# Patient Record
Sex: Female | Born: 1950 | Race: White | Hispanic: No | Marital: Single | Smoking: Never smoker
Health system: Southern US, Community
[De-identification: ages and names within clinical notes are randomized; demographics above are authoritative.]

## PROBLEM LIST (undated history)

## (undated) DIAGNOSIS — G40909 Epilepsy, unspecified, not intractable, without status epilepticus: Secondary | ICD-10-CM

## (undated) DIAGNOSIS — L97909 Non-pressure chronic ulcer of unspecified part of unspecified lower leg with unspecified severity: Secondary | ICD-10-CM

## (undated) DIAGNOSIS — I872 Venous insufficiency (chronic) (peripheral): Secondary | ICD-10-CM

## (undated) DIAGNOSIS — E079 Disorder of thyroid, unspecified: Secondary | ICD-10-CM

## (undated) DIAGNOSIS — G250 Essential tremor: Secondary | ICD-10-CM

## (undated) DIAGNOSIS — I83009 Varicose veins of unspecified lower extremity with ulcer of unspecified site: Secondary | ICD-10-CM

## (undated) DIAGNOSIS — I509 Heart failure, unspecified: Secondary | ICD-10-CM

## (undated) DIAGNOSIS — I10 Essential (primary) hypertension: Secondary | ICD-10-CM

## (undated) DIAGNOSIS — R1310 Dysphagia, unspecified: Secondary | ICD-10-CM

## (undated) HISTORY — DX: Dysphagia, unspecified: R13.10

## (undated) HISTORY — PX: OTHER SURGICAL HISTORY: SHX169

## (undated) HISTORY — PX: ABDOMINAL HYSTERECTOMY: SHX81

---

## 1999-02-11 ENCOUNTER — Encounter: Payer: Self-pay | Admitting: Emergency Medicine

## 1999-02-11 ENCOUNTER — Inpatient Hospital Stay (HOSPITAL_COMMUNITY): Admission: EM | Admit: 1999-02-11 | Discharge: 1999-02-12 | Payer: Self-pay | Admitting: Emergency Medicine

## 2000-04-29 ENCOUNTER — Encounter: Payer: Self-pay | Admitting: Orthopedic Surgery

## 2000-04-29 ENCOUNTER — Encounter: Admission: RE | Admit: 2000-04-29 | Discharge: 2000-04-29 | Payer: Self-pay | Admitting: Orthopedic Surgery

## 2000-09-08 ENCOUNTER — Emergency Department (HOSPITAL_COMMUNITY): Admission: EM | Admit: 2000-09-08 | Discharge: 2000-09-08 | Payer: Self-pay | Admitting: Emergency Medicine

## 2000-09-08 ENCOUNTER — Encounter: Payer: Self-pay | Admitting: Emergency Medicine

## 2004-03-05 ENCOUNTER — Emergency Department (HOSPITAL_COMMUNITY): Admission: EM | Admit: 2004-03-05 | Discharge: 2004-03-05 | Payer: Self-pay | Admitting: Emergency Medicine

## 2004-03-15 ENCOUNTER — Ambulatory Visit (HOSPITAL_COMMUNITY): Admission: RE | Admit: 2004-03-15 | Discharge: 2004-03-16 | Payer: Self-pay | Admitting: Orthopedic Surgery

## 2004-09-08 ENCOUNTER — Emergency Department (HOSPITAL_COMMUNITY): Admission: EM | Admit: 2004-09-08 | Discharge: 2004-09-08 | Payer: Self-pay | Admitting: Emergency Medicine

## 2005-04-16 ENCOUNTER — Ambulatory Visit (HOSPITAL_COMMUNITY): Admission: RE | Admit: 2005-04-16 | Discharge: 2005-04-16 | Payer: Self-pay | Admitting: Orthopedic Surgery

## 2007-01-02 ENCOUNTER — Emergency Department (HOSPITAL_COMMUNITY): Admission: EM | Admit: 2007-01-02 | Discharge: 2007-01-02 | Payer: Self-pay | Admitting: Emergency Medicine

## 2007-12-09 ENCOUNTER — Ambulatory Visit: Payer: Self-pay | Admitting: Internal Medicine

## 2007-12-09 ENCOUNTER — Ambulatory Visit: Payer: Self-pay | Admitting: Cardiology

## 2007-12-09 ENCOUNTER — Inpatient Hospital Stay (HOSPITAL_COMMUNITY): Admission: AD | Admit: 2007-12-09 | Discharge: 2007-12-16 | Payer: Self-pay | Admitting: Internal Medicine

## 2007-12-10 ENCOUNTER — Encounter (INDEPENDENT_AMBULATORY_CARE_PROVIDER_SITE_OTHER): Payer: Self-pay | Admitting: Internal Medicine

## 2007-12-10 ENCOUNTER — Ambulatory Visit: Payer: Self-pay | Admitting: *Deleted

## 2008-01-28 ENCOUNTER — Inpatient Hospital Stay (HOSPITAL_COMMUNITY): Admission: EM | Admit: 2008-01-28 | Discharge: 2008-02-05 | Payer: Self-pay | Admitting: Emergency Medicine

## 2008-01-28 ENCOUNTER — Ambulatory Visit: Payer: Self-pay | Admitting: Internal Medicine

## 2008-01-31 ENCOUNTER — Ambulatory Visit: Payer: Self-pay | Admitting: Vascular Surgery

## 2008-02-09 ENCOUNTER — Encounter (HOSPITAL_BASED_OUTPATIENT_CLINIC_OR_DEPARTMENT_OTHER): Admission: RE | Admit: 2008-02-09 | Discharge: 2008-05-09 | Payer: Self-pay | Admitting: Surgery

## 2008-05-11 ENCOUNTER — Encounter (HOSPITAL_BASED_OUTPATIENT_CLINIC_OR_DEPARTMENT_OTHER): Admission: RE | Admit: 2008-05-11 | Discharge: 2008-08-09 | Payer: Self-pay | Admitting: Internal Medicine

## 2008-08-13 ENCOUNTER — Encounter (HOSPITAL_BASED_OUTPATIENT_CLINIC_OR_DEPARTMENT_OTHER): Admission: RE | Admit: 2008-08-13 | Discharge: 2008-09-13 | Payer: Self-pay | Admitting: Internal Medicine

## 2008-09-11 ENCOUNTER — Emergency Department (HOSPITAL_COMMUNITY): Admission: EM | Admit: 2008-09-11 | Discharge: 2008-09-11 | Payer: Self-pay | Admitting: Emergency Medicine

## 2008-09-21 ENCOUNTER — Encounter: Admission: RE | Admit: 2008-09-21 | Discharge: 2008-09-21 | Payer: Self-pay | Admitting: Orthopedic Surgery

## 2008-10-01 ENCOUNTER — Ambulatory Visit (HOSPITAL_COMMUNITY): Admission: RE | Admit: 2008-10-01 | Discharge: 2008-10-01 | Payer: Self-pay | Admitting: Orthopedic Surgery

## 2008-10-06 ENCOUNTER — Encounter (HOSPITAL_BASED_OUTPATIENT_CLINIC_OR_DEPARTMENT_OTHER): Admission: RE | Admit: 2008-10-06 | Discharge: 2008-10-26 | Payer: Self-pay | Admitting: Internal Medicine

## 2009-02-28 IMAGING — CR DG CHEST 1V PORT
1 series · 1 of 1 positions shown · non-contrast
Comparison: None.

CLINICAL DATA: PICC line placement. 
 PORTABLE CHEST ([DATE] HOURS):

[view not recorded]
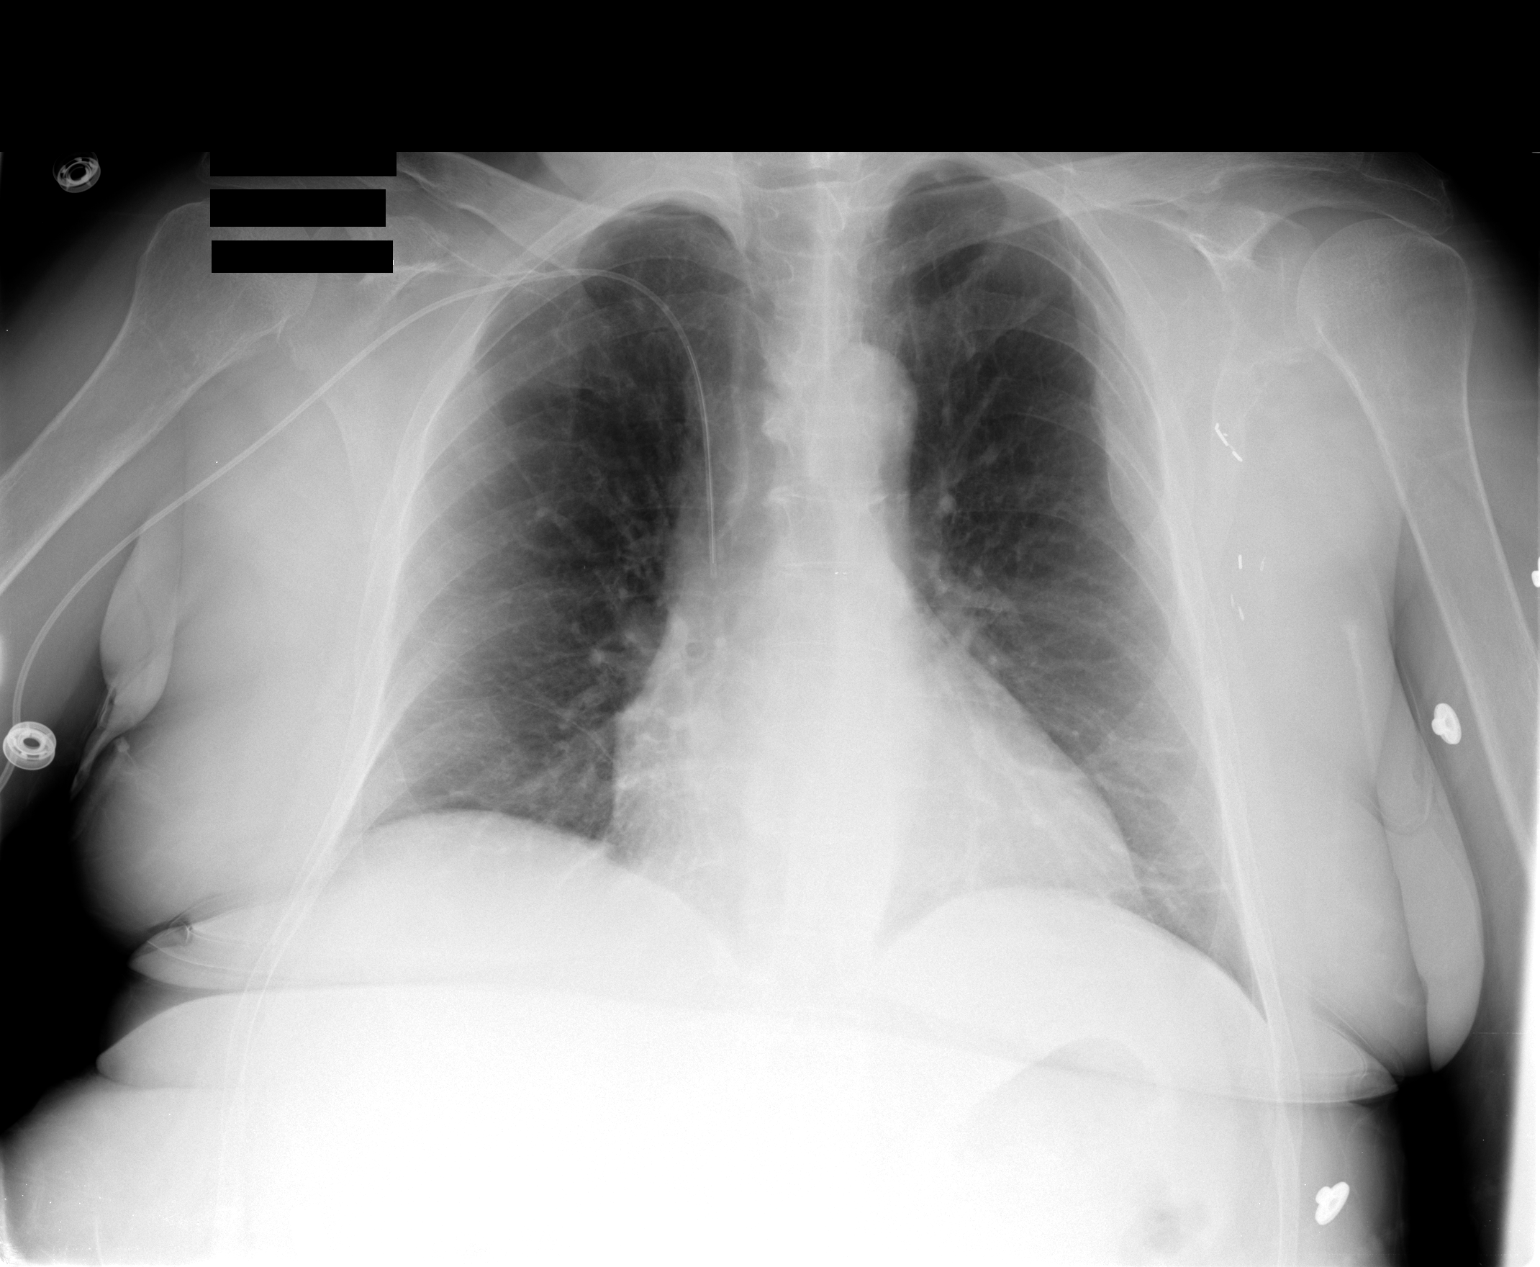

[1 of 1 positions shown; findings below may reference images not displayed]

FINDINGS: Right arm PICC tip is in the lower SVC.   There is no pneumothorax.  The heart is mildly enlarged.  The mediastinal contours are normal.  The lungs are clear.  Old left-sided rib fractures and adjacent pleural thickening are noted.  There are surgical clips in the left axilla.
IMPRESSION: Right arm PICC tip in the SVC.  No pneumothorax.

## 2009-12-12 IMAGING — CT CT 3D INDEPENDENT WKST
2 of 4 series · 6 of 14 positions shown, 7 images · non-contrast
Comparison: Left shoulder radiographs 09/11/2008

CLINICAL DATA: Fell.  Fractured left proximal humerus.

CT LEFT SHOULDER WITHOUT CONTRAST
TECHNIQUE: Multidetector CT imaging of the left shoulder was
performed according to the standard protocol without intravenous
contrast. Multiplanar CT image reconstructions were also generated.
TECHNIQUE: 3-dimensional CT images were rendered by post-processing
of the original CT data at independent workstation.  The 3-
dimensional CT images were interpreted, and findings were reported
in the accompanying complete CT report for this study.

[Series 4: left humerus · axial · 0.33mm/px · z∈[+166,+252]mm · 3 of 139 slices shown, 4 images (1 of 2)]
[im 35/139  soft-tissue]
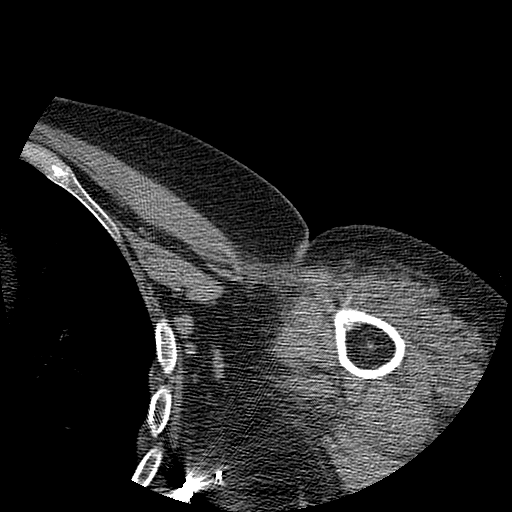
[im 35/139  bone]
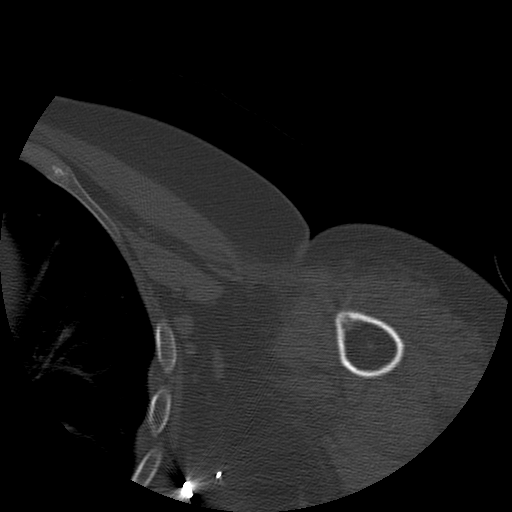
[im 70/139  bone]
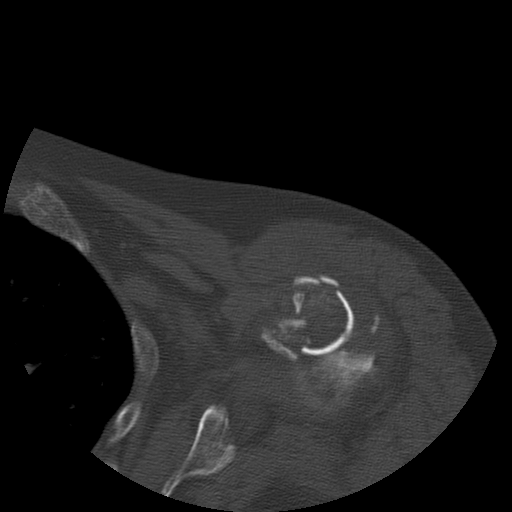
[im 104/139  bone]
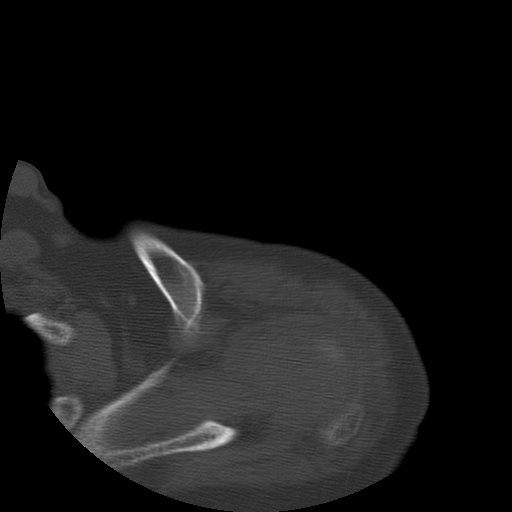

[Series 5: left humerus · axial · 0.33mm/px · z∈[+166,+252]mm · 3 of 139 slices shown (2 of 2)]
[im 35/139  bone]
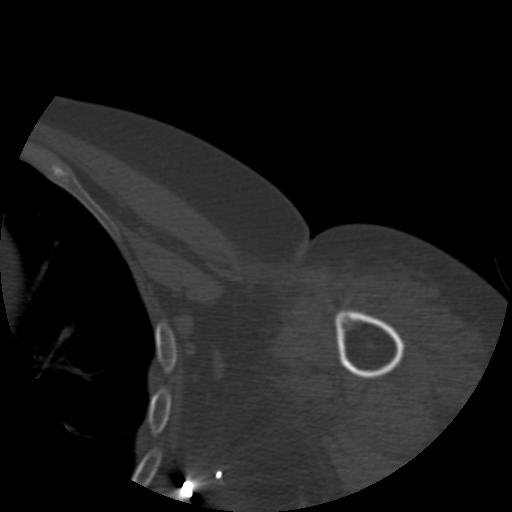
[im 70/139  bone]
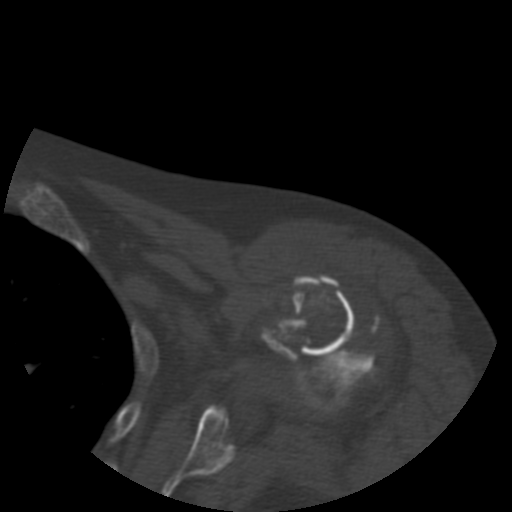
[im 104/139  bone]
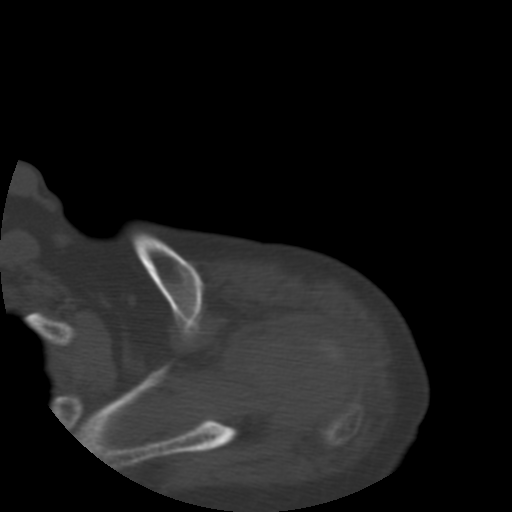

[6 of 14 positions shown; findings below may reference images not displayed]

FINDINGS: There is a complex, comminuted, displaced four part
humeral head and neck fracture.  The humeral head is displaced
slightly rotated posteriorly in comparison with the humeral shaft.
The glenohumeral joint spaces maintained.  The AC joint is intact.
There is mild lateral downsloping of the acromion which is type 2
in shape.
IMPRESSION: 1.  Complex, comminuted and displaced four part humeral head /neck
fracture.

3-DIMENSIONAL CT IMAGE RENDERING AT INDEPENDENT WORKSTATION:

## 2010-07-02 ENCOUNTER — Emergency Department (HOSPITAL_COMMUNITY): Admission: EM | Admit: 2010-07-02 | Discharge: 2010-07-02 | Payer: Self-pay | Admitting: Emergency Medicine

## 2010-08-10 ENCOUNTER — Inpatient Hospital Stay (HOSPITAL_COMMUNITY): Admission: EM | Admit: 2010-08-10 | Discharge: 2010-08-15 | Payer: Self-pay | Admitting: Emergency Medicine

## 2010-08-12 ENCOUNTER — Encounter (INDEPENDENT_AMBULATORY_CARE_PROVIDER_SITE_OTHER): Payer: Self-pay | Admitting: Internal Medicine

## 2011-03-01 LAB — BASIC METABOLIC PANEL
CO2: 22 mEq/L (ref 19–32)
CO2: 25 mEq/L (ref 19–32)
Calcium: 8.6 mg/dL (ref 8.4–10.5)
Chloride: 114 mEq/L — ABNORMAL HIGH (ref 96–112)
Creatinine, Ser: 0.51 mg/dL (ref 0.4–1.2)
GFR calc Af Amer: >60 mL/min (ref >60)
Glucose, Bld: 126 mg/dL — ABNORMAL HIGH (ref 70–99)
Sodium: 145 mEq/L (ref 135–145)

## 2011-03-01 LAB — CBC
HCT: 31.2 % — ABNORMAL LOW (ref 36.0–46.0)
HCT: 32.7 % — ABNORMAL LOW (ref 36.0–46.0)
HCT: 38.9 % (ref 36.0–46.0)
Hemoglobin: 10.5 g/dL — ABNORMAL LOW (ref 12.0–15.0)
Hemoglobin: 9.8 g/dL — ABNORMAL LOW (ref 12.0–15.0)
Hemoglobin: 9.9 g/dL — ABNORMAL LOW (ref 12.0–15.0)
MCH: 29.4 pg (ref 26.0–34.0)
MCH: 29.6 pg (ref 26.0–34.0)
MCH: 29.9 pg (ref 26.0–34.0)
MCHC: 30.2 g/dL (ref 30.0–36.0)
MCHC: 30.3 g/dL (ref 30.0–36.0)
MCHC: 31.4 g/dL (ref 30.0–36.0)
MCV: 97.6 fL (ref 78.0–100.0)
MCV: 97.9 fL (ref 78.0–100.0)
Platelets: 214 10*3/uL (ref 150–400)
Platelets: 56 10*3/uL — ABNORMAL LOW (ref 150–400)
RBC: 3.25 MIL/uL — ABNORMAL LOW (ref 3.87–5.11)
RBC: 3.31 MIL/uL — ABNORMAL LOW (ref 3.87–5.11)
RDW: 17.1 % — ABNORMAL HIGH (ref 11.5–15.5)
WBC: 5.5 10*3/uL (ref 4.0–10.5)
WBC: 5.5 10*3/uL (ref 4.0–10.5)

## 2011-03-01 LAB — COMPREHENSIVE METABOLIC PANEL
ALT: 10 U/L (ref 0–35)
ALT: 14 U/L (ref 0–35)
AST: 21 U/L (ref 0–37)
AST: 34 U/L (ref 0–37)
Albumin: 2.1 g/dL — ABNORMAL LOW (ref 3.5–5.2)
Alkaline Phosphatase: 196 U/L — ABNORMAL HIGH (ref 39–117)
Alkaline Phosphatase: 275 U/L — ABNORMAL HIGH (ref 39–117)
CO2: 26 mEq/L (ref 19–32)
CO2: 26 mEq/L (ref 19–32)
Calcium: 8.1 mg/dL — ABNORMAL LOW (ref 8.4–10.5)
Chloride: 102 mEq/L (ref 96–112)
Chloride: 105 mEq/L (ref 96–112)
Creatinine, Ser: 0.91 mg/dL (ref 0.4–1.2)
GFR calc Af Amer: >60 mL/min (ref >60)
GFR calc non Af Amer: >60 mL/min (ref >60)
GFR calc non Af Amer: >60 mL/min (ref >60)
Potassium: 3.1 mEq/L — ABNORMAL LOW (ref 3.5–5.1)
Potassium: 3.5 mEq/L (ref 3.5–5.1)
Sodium: 140 mEq/L (ref 135–145)
Total Protein: 6.7 g/dL (ref 6.0–8.3)

## 2011-03-01 LAB — URINALYSIS, ROUTINE W REFLEX MICROSCOPIC
Hgb urine dipstick: NEGATIVE
Nitrite: POSITIVE — AB
pH: 8 (ref 5.0–8.0)

## 2011-03-01 LAB — URINE MICROSCOPIC-ADD ON

## 2011-03-01 LAB — URINE CULTURE: Colony Count: >=100000

## 2011-03-01 LAB — WOUND CULTURE: Culture: NO GROWTH

## 2011-03-01 LAB — DIFFERENTIAL
Eosinophils Absolute: 0.1 10*3/uL (ref 0.0–0.7)
Monocytes Relative: 8 % (ref 3–12)
Neutrophils Relative %: 65 % (ref 43–77)

## 2011-03-01 LAB — TSH: TSH: 18.458 u[IU]/mL — ABNORMAL HIGH (ref 0.350–4.500)

## 2011-03-01 LAB — BRAIN NATRIURETIC PEPTIDE: Pro B Natriuretic peptide (BNP): 458 pg/mL — ABNORMAL HIGH (ref 0.0–100.0)

## 2011-03-01 LAB — VANCOMYCIN, TROUGH: Vancomycin Tr: 14.7 ug/mL (ref 10.0–20.0)

## 2011-03-01 LAB — POCT CARDIAC MARKERS: Myoglobin, poc: 319 ng/mL (ref 12–200)

## 2011-03-01 LAB — MAGNESIUM: Magnesium: 2.4 mg/dL (ref 1.5–2.5)

## 2011-03-03 LAB — CBC
Hemoglobin: 12.9 g/dL (ref 12.0–15.0)
MCH: 31.6 pg (ref 26.0–34.0)
MCV: 93 fL (ref 78.0–100.0)
RBC: 4.08 MIL/uL (ref 3.87–5.11)
WBC: 12.9 10*3/uL — ABNORMAL HIGH (ref 4.0–10.5)

## 2011-03-03 LAB — BASIC METABOLIC PANEL
BUN: 16 mg/dL (ref 6–23)
Chloride: 93 mEq/L — ABNORMAL LOW (ref 96–112)
GFR calc Af Amer: >60 mL/min (ref >60)
Potassium: 3.1 mEq/L — ABNORMAL LOW (ref 3.5–5.1)
Sodium: 137 mEq/L (ref 135–145)

## 2011-03-03 LAB — URINALYSIS, ROUTINE W REFLEX MICROSCOPIC
Glucose, UA: NEGATIVE mg/dL
Ketones, ur: NEGATIVE mg/dL
pH: 7 (ref 5.0–8.0)

## 2011-03-03 LAB — URINE MICROSCOPIC-ADD ON

## 2011-03-03 LAB — DIFFERENTIAL
Eosinophils Absolute: 0.4 10*3/uL (ref 0.0–0.7)
Eosinophils Relative: 3 % (ref 0–5)
Lymphocytes Relative: 13 % (ref 12–46)
Lymphs Abs: 1.6 10*3/uL (ref 0.7–4.0)
Monocytes Relative: 13 % — ABNORMAL HIGH (ref 3–12)
Neutrophils Relative %: 72 % (ref 43–77)

## 2011-03-03 LAB — URINE CULTURE: Culture  Setup Time: 201107172133

## 2011-05-01 NOTE — Assessment & Plan Note (Signed)
[facility]   NAMESHAHD, OCCHIPINTI                  ACCOUNT NO.:  [id number]   MEDICAL RECORD NO.:  [id number]      DATE OF BIRTH:  [birth year]   PHYSICIAN:  Theresia Majors. Tanda Rockers, M.D. VISIT DATE:  05/20/2008                                   OFFICE VISIT   SUBJECTIVE:  Ms. Probus is a 60 year old who we followed for stasis and  ulceration involving the lower extremity.  In the interim, she has had  no excessive drainage, malodor, pain, or fever.  She continues to  practice periodic elevation of her legs.  She is accompanied by her  caregiver.   OBJECTIVE:  VITAL SIGNS:  Blood pressure is 145/73, respirations 18,  pulse rate 88, and temperature is 98.6.  EXTREMITIES:  Inspection of the lower extremities show that the edema  has been well controlled, is manifested by linear wrinkles.  There is no  evidence of ascending infection.  Capillary refill remains brisk with  palpable pulses bilaterally.  Wound #6 on the right medial lower  extremity is completely resolved with epithelization and but some  residual desquamation.  Wound #7 on the left dorsal foot has decreased  significantly with healthy-appearing granulation and no malodor.   ASSESSMENT:  Clinical improvement of stasis ulcers.   PLAN:  We will resume triamcinolone ointment plus Profore wraps  bilaterally.  On the wound #7 we will add Hydrofera.  These dressings  will be changed weekly by the nurse.  The physician will reevaluate the  patient in 2 weeks.      Harold A. Tanda Rockers, M.D.  Electronically Signed     HAN/MEDQ  D:  05/27/2008  T:  05/28/2008  Job:  [id number]

## 2011-05-01 NOTE — Assessment & Plan Note (Signed)
[facility]   NAMEROSELENE, [name]                  ACCOUNT NO.:  [id number]   MEDICAL RECORD NO.:  [id number]      DATE OF BIRTH:  [birth year]   PHYSICIAN:  Theresia Majors. Tanda Rockers, M.D. VISIT DATE:  04/27/2008                                   OFFICE VISIT   SUBJECTIVE:  [name] is a 60 year old lady who we follow for bilateral  stasis ulcers.  In the interim, she has been treated by the home health  nurse with Silverlon swathe and Profores.  She has completed a course of  Septra DS 1 p.o. b.i.d.  The nurses are seeing her for 2 weeks.  There  has been decrease in drainage, no malodor, and no pain.  She has had no  fever.   OBJECTIVE:  Blood pressure is 134/78, respirations 18, pulse rate 108,  and temperature 97.9.  She is accompanied by an attendant.  Inspection  of the lower extremity shows that the wound #2 on the right leg has  resolved.  The wounds #6 and #7 are 100% granulated with waxy exudates.  No debridement is needed. There is local edema.  Both feet are warm, but  they are not feverish.  The capillary refill is brisk.  There is no  evidence of ischemia.  The wound on the dorsum of the left foot was  recultured.   ASSESSMENT:  Improved stasis ulcers with adequate compression.   PLAN:  We will decrease the frequency of the dressing changes to once  weekly, and we will add Hydrofera absorptive bacteriostatic dressing  with triamcinolone ointment and a Profore wrap.  We will reevaluate the  patient weekly in the wound center by the nurse and in 2 weeks by the  physician.      Harold A. Tanda Rockers, M.D.  Electronically Signed     HAN/MEDQ  D:  04/27/2008  T:  04/28/2008  Job:  [id number]

## 2011-05-01 NOTE — Assessment & Plan Note (Signed)
[facility]   NAMEKEYSHAWNA, [name]                  ACCOUNT NO.:  [id number]   MEDICAL RECORD NO.:  [id number]      DATE OF BIRTH:  30-Mar-1951   PHYSICIAN:  Maxwell Caul, M.D. VISIT DATE:  08/13/2008                                   OFFICE VISIT   [name] has returned today in followup for her largely venous stasis  wound over the left anterior leg at the level of the ankle.  This  initially had exposed tendon in the middle; however, we have been making  good progress using Hydrofera Blue with a Profore wrap.  The right leg  also has similar degrees of edema, but has not had any wounds.   On examination, her temperature is 98.  The area on the left anterior  ankle measures 1 x 2 x 0.1.  This continues to make excellent progress  in terms of its wound volume.  There is good granulation as well as  advancing epithelialization.  There is no evidence of infection, nothing  required debridement.   IMPRESSION:  Stasis wound, left anterior ankle.  We have continued with  the [facility] and Profore wraps bilaterally.  We had some  discussion about ongoing maintenance.  Apparently, the patient has been  prescribed graded pressure stockings in the past which is something she  adamantly refuses.  She is at an assisted living [facility]).  There should be someone that could help her with this;  however, the attendant who is with her today stated that they have been  down this road in the past and she has not complied.  We could teach the  nurses aide or the LPN at the facility to do simple Ace wraps; however,  the attendant who was with her today expressed pessimism that ultimately  we will be able to do anything in terms of maintenance.  We will see her  again in a week's time.           ______________________________  Maxwell Caul, M.D.     MGR/MEDQ  D:  08/13/2008  T:  08/14/2008  Job:  [id number]

## 2011-05-01 NOTE — Group Therapy Note (Signed)
[name], [name]                  ACCOUNT NO.:  [id number]   MEDICAL RECORD NO.:  [id number]          PATIENT TYPE:  REC   LOCATION:  FOOT                         FACILITY:  [facility]   PHYSICIAN:  Lenon Curt. Chilton Si, M.D.  DATE OF BIRTH:  03-Jan-1951                                 PROGRESS NOTE   [facility]:   HISTORY:  This 60 year old female who lives in an assisted living facility did not  apply the TED hose and refused to have this done, since she was seen  here one week ago.  She did have other materials applied and took it  upon herself to sprinkle a perfumed lotion to her right leg.  Within one  hour of application of this, large blisters appeared.  The clinician on  call to the facility gave an order to apply Xeroform dressing over the  blistered area and then a non-stick gauze and wrap and changed daily,  until the patient could return to the Wound Care Center.   [facility] sent a letter in regards to their  contact with the family.  The family apparently informed them that this  happened once before when an [place] boot was removed and blisters appeared,  about three to four years ago.  Dr. Dyke Brackett, who was in charge of  the boot at that time told the family that a removal of the boot sent  the skin into shock.  The family has requested through her assisted  living facility, information regards to what could be done about her  skin going into shock and getting blisters.   A conversation was held with the patient today in regards to her wound  care.  She still continues to indicate that she is reticent applying TED  hose or anything other than an [employer] production pl[employer].   My concern at the time I saw her last time was that there had been some  wrinkling of the compression materials which actually dug into her skin  and there were linear areas of intense pressure across the leg at  several levels because of this.  At the time I saw her last, it  was  anticipated that she might have problems with reapplication of Unna  boots; however, she virtually insists that this be done today.   PHYSICAL EXAMINATION:  VITAL SIGNS:  Temperature 98.2 degrees, pulse 95, respirations 18, blood  pressure 142/81.  EXTREMITIES:  She has multiple wounds, the largest of which is over the  lower right leg and is much deteriorated since I last saw her.  There  are huge water-filled blisters on the medial side of the leg and there  is denudation of the skin at several levels in a circumferential  fashion.   The dorsal wound on the left foot is now resolved.   PLAN:  Prophyllite was applied to the legs bilaterally.  Bactroban was applied  to the right leg.  The wounds did not appear to be infected today.   FOLLOWUP:  She is to return on September 02, 2008, with  Dr. Maxwell Caul, as  previously scheduled.      Lenon Curt Chilton Si, M.D.  Electronically Signed     AGG/MEDQ  D:  08/27/2008  T:  08/27/2008  Job:  [id number]

## 2011-05-01 NOTE — Assessment & Plan Note (Signed)
[facility]   NAMESHERIAL, EBRAHIM                  ACCOUNT NO.:  [id number]   MEDICAL RECORD NO.:  [id number]      DATE OF BIRTH:  [birth year]   PHYSICIAN:  Maxwell Caul, M.D.      VISIT DATE:                                   OFFICE VISIT   [name], who lives at [facility], is a lady we  have been following for difficult stasis wounds, most recently on her  left anterior leg at the level of the ankle.  This has exposed tendon in  middle.  We have been using Hydrofera Blue on this with a Profore wrap.  She returns today in followup.   On examination, her temperature is 97.6, pulse 78, respirations 16, and  blood pressure 130/81.  The area in question measures 1.5 x 2 x 0.2,  this is an improvement from previous measurements.  There is good  healthy granulation here attempting to close over the tendon area, which  is indeed nice to see.  There is no evidence of surrounding infection  and the granulation tissue and surrounding tissue looks healthy.  In  terms of her severe bilateral venous stasis, her edema is under good  control.  Ultimately, she is going to need some form of graded pressure  stockings, and as she is in an assisted living, she should be able to  use these appropriately.   IMPRESSION:  Venous stasis wound, this is considerably improved.  We  continued with the Hydrofera and Profore wrap.  To the right leg, we  continued with the Profore wrap.  As mentioned, if this is able to heal  over in the next 3-4 weeks, she will need prescriptions for graded  pressure stockings.            ______________________________  Maxwell Caul, M.D.     MGR/MEDQ  D:  07/08/2008  T:  07/09/2008  Job:  [phone]

## 2011-05-01 NOTE — Assessment & Plan Note (Signed)
[facility]   NAMEAMORETTE, [name]                  ACCOUNT NO.:  [id number]   MEDICAL RECORD NO.:  [id number]      DATE OF BIRTH:  1951/06/19   PHYSICIAN:  Theresia Majors. Tanda Rockers, M.D. VISIT DATE:  04/06/2008                                   OFFICE VISIT   SUBJECTIVE:  [name] is a 60 year old lady, who we are following for  severe bilateral stasis and fluid retention.  We applied bilateral  Profores with triamcinolone ointment and Silverlon swabs overlying the  wounds.  She returns for followup.  There has been no interim fever.  There has been mild pain, which has been well tolerated.  She is  accompanied by an attendant.   OBJECTIVE:  VITAL SIGNS:  Pulse 93 and temperature 97.9.  MUSCULOSKELETAL:  Inspection of the lower extremity shows that the edema  is well controlled, is manifested by lineal wrinkles.  SKIN:  The wound #2 on the lower leg has decreased significantly.  Wound  #6 on the right medial leg has a hypergranulation component.  No  debridement was needed.  The wound is punched out with heaped-up edges.  There is no evidence of ascending lymphangitis.  Wound #7 on the dorsal  aspect of the left foot in correspondent to a previous wrap injury has a  moderate amount of necrosis under EMLA block.  An excisional debridement  was performed with forceps and scissors with excision of nonviable skin,  subcutaneous tissue, and fascia.  There are no loculations in the  anterior leg or in the distal foot.   ASSESSMENT:  Clinical improvement of all wounds, adequate debridement of  wound #7.   PLAN:  Both of the wound #7 and #6 were cultured separately.  Due to the  appearance of wound #6, we will start the patient on Septra DS one p.o.  b.i.d.  We will change the antibiotic contingent upon the culture  report.  We will continue to see the patient twice weekly for nursing,  dressing changes, and every 2 weeks per the physician.      Harold A. Tanda Rockers, M.D.  Electronically Signed     HAN/MEDQ  D:  04/06/2008  T:  04/07/2008  Job:  [id number]

## 2011-05-01 NOTE — Assessment & Plan Note (Signed)
[facility]   NAMECAREENA, DEGRAFFENREID                  ACCOUNT NO.:  [id number]   MEDICAL RECORD NO.:  [id number]      DATE OF BIRTH:  [birth year]   PHYSICIAN:  Maxwell Caul, M.D. VISIT DATE:  08/04/2008                                   OFFICE VISIT   [name] is a 60 year old woman who lives at [facility].  She has been treated in this clinic for a difficult stasis  wound most recently on her left anterior leg at the level of the ankle.  This initially had exposed tendon in the middle.  We have been using  Hydrofera Blue with a Profore wrap.  She has been responding nicely to  this treatment.   PHYSICAL EXAMINATION:  Her temperature is 98.6, pulse 103, and blood  pressure 160/75.  The area over the right dorsal foot measures 1.4 x 2.4  x 0.3.  There is a bridge of healing epithelium through this and the  base of this is nicely granulated.  I think we are showing good signs of  healing here.  There is no evidence of infection.   IMPRESSION:  Stasis wound, left anterior ankle.  We have continued with  the [facility] with Profore wraps bilaterally.  Ultimately, I think,  the patient is going to require graded pressure stockings for very  extensive edema control.  She may benefit from a small amount of Lasix.  This would help with edema control as well.  We had some discussion  about external graded pressure pumps; however, we would need to see  about her insurance implication of this.  She is in an assisted living.  We should be able to apply graded pressure stockings.  I am generally  forecasting that this wound should be done within the next 2 or 3 weeks.  We will see her again next week.           ______________________________  Maxwell Caul, M.D.     MGR/MEDQ  D:  08/04/2008  T:  08/05/2008  Job:  [id number]

## 2011-05-01 NOTE — Assessment & Plan Note (Signed)
[facility]   [name], [name]                  ACCOUNT NO.:  [id number]   MEDICAL RECORD NO.:  [id number]      DATE OF BIRTH:  21-Dec-1950   PHYSICIAN:  Maxwell Caul, M.D. VISIT DATE:  09/02/2008                                   OFFICE VISIT   [name] is a lady we are actually following at [facility].  I have not seen her in 2 weeks now, however, she was followed  at the wound care center for her severe bilateral venous stasis.  She  had been having Profore wraps bilaterally.  When I last saw her here on  August 13, 2008, she reiterated that she adamantly refused graded  pressure stockings and this is something we have reiterated in the past.  When she was seen last week, her wounds largely healed, therefore, she  was not aggressively wrapped.  There was some suggestion that she  actually had self-inflicted wounds.  I am not precisely certain what was  placed last week, however, after she got back to the facility, she  apparently twisted her left knee and has not walked since.  Prior to  this 2 weeks ago she felt that her right knee was injured by spending  too much time on our examining table.   On examination, temperature is 98.  She has a few remaining wounds on  the right leg which are related to the venous stasis, however, almost  everything on her bilateral legs looks fine other than the severe stasis  itself.  Her edema control is quite good.  I attempted to examine her  knees.  She has had surgery on the right knee in the past and I am not  exactly sure what a variant.  The right knee is swollen and somewhat  warm although it was not convincing that there was a gross new  abnormality there, she was somewhat tender along the line of the medial  collateral ligament but I could not convince myself there was extreme  instability.  There was no doubt that the left knee is unstable  especially the lateral collateral ligament.  Her  cruciate ligaments on  the left seem fine.   IMPRESSION:  1. Bilateral venous stasis disease.  This does not look too ominous      to me.  We applied TCA and put her back in a Profore leg wrap.      Strangely enough, we do have a donated external compression device      that came from a patient who expired.  We may be able to send that      down to the facility in order to maintain her edema given the fact      that she refuses (adamantly) compression stockings.  2. Knee pain left greater than right.  I send her for x-rays down to      the department here, although I am not expecting to see a fracture,      and I simply needed to rule it out.  There is some degree of      inflammation in the right knee which may not be osteoarthritis.  I      do not  think either one of the knees has significant effusions.  We      will see her again in a week in the facility for wrap changes.  At      that point if everything is healed, we may be able to send her back      to the facility with the external compression device and she can be      discharged.           ______________________________  Maxwell Caul, M.D.     MGR/MEDQ  D:  09/02/2008  T:  09/03/2008  Job:  [id number]

## 2011-05-01 NOTE — Assessment & Plan Note (Signed)
[facility]   NAMECHEYAN, [name]                  ACCOUNT NO.:  [id number]   MEDICAL RECORD NO.:  [id number]      DATE OF BIRTH:  Jun 08, 1951   PHYSICIAN:  Theresia Majors. Tanda Rockers, M.D. VISIT DATE:  03/09/2008                                   OFFICE VISIT   SUBJECTIVE:  [name] is a 60 year old lady whom we have followed for  bilateral stasis ulcerations with fluid retention.  In the interim we  have treated her with bilateral Profore wraps, triamcinolone ointment,  and lamb's wool between her toes.  She has been seen in concert with the  Home Health nurse who has been during her dressing changes biweekly.  She returns for followup.  There has been no interim fever.  There is no  pain.  The patient continues to be ambulatory.   OBJECTIVE:  Blood pressure is 121/74, respirations 20, pulse rate 93,  temperature 97.8.  Inspection of the lower extremity shows that there has been adequate  compression as manifested by the lineal wrinkles in her lower extremity.  She continues to have significant edema above the wrap.  The ulcers  themselves uniformly show improvement.  There is moderate amount of  maceration on the right lower extremity which I suspect is related to  the wrap becoming too moist.  There is puffy edema judged at 2+ of the  toes.  The pedal pulse remains readily palpable.  There is no evidence  of ascending infection or abscess formation.   ASSESSMENT:  Significant improvement in edema and decrease in stasis  ulcers associated with adequate compression.   PLAN:  We will decrease her dressing changes to once a week.  She will  be seen by the nurse weekly and will be evaluated by the physician at 2-  week intervals.      Harold A. Tanda Rockers, M.D.  Electronically Signed     HAN/MEDQ  D:  03/09/2008  T:  03/09/2008  Job:  [id number]

## 2011-05-01 NOTE — Assessment & Plan Note (Signed)
[facility]   NAMEAMANAT, HACKEL                  ACCOUNT NO.:  [id number]   MEDICAL RECORD NO.:  [id number]      DATE OF BIRTH:  November 11, 1951   PHYSICIAN:  Theresia Majors. Tanda Rockers, M.D. VISIT DATE:  05/13/2008                                   OFFICE VISIT   SUBJECTIVE:  Ms. [name] is a 60 year old female, who we have followed for  bilateral stasis ulcers associated with fluid retention of cardiac  origin.  In the interim, she has been seen by the nurse on a weekly  basis with use of Hydrofera, triamcinolone ointment, and a Profore wrap.  She returns for followup.  There has been no interim fever or excessive  drainage.   OBJECTIVE:  VITAL SIGNS:  Blood pressure is 144/78, respirations 18,  pulse rate 88, temperature 98.2.  She is accompanied by her aide.  EXTREMITIES:  Inspection of the lower extremity shows that both wounds 6  and 7 have decreased in area in volume.  There is no excessive drainage  or malodor, no debridement is needed.  Both feet are warm and well  perfused without evidence of ischemia or ascending infection.  The  ulcers themselves are uniformly contracting with healthy-appearing  granulation and advancing epithelium.   ASSESSMENT:  Clinical improvement of stasis ulcers with controlled  edema.   PLAN:  We will return the patient to compression wraps and utilize  Hydrofera with the Profores.  We will continue the nurse visits weekly  with a physician evaluation in 2 weeks      Harold A. Tanda Rockers, M.D.  Electronically Signed     HAN/MEDQ  D:  05/13/2008  T:  05/14/2008  Job:  [id number]

## 2011-05-01 NOTE — Assessment & Plan Note (Signed)
[facility]   NAMEPOLETTE, NOFSINGER                  ACCOUNT NO.:  [id number]   MEDICAL RECORD NO.:  [id number]      DATE OF BIRTH:  [birth year]   PHYSICIAN:  Theresia Majors. Tanda Rockers, M.D. VISIT DATE:  02/24/2008                                   OFFICE VISIT   SUBJECTIVE:  Ms. [name] is a 60 year old female who is being followed in  the [facility] for the management of recalcitrant lower extremity  edema with ulceration.  In the interim, she was treated with Silvadene  cream and Profore wraps reinforced with ABD to manage the anticipated  drainage.  She is non ambulatory.  She spends most of her time in a  chair that has the option of leg elevation.  There has been no pain.   OBJECTIVE:  Blood pressure is 159/110, respirations 20, pulse rate 114,  temperature 98.  The patient is chronically ill in appearance.  Has an involuntary fine  tremor, but appears to be coherent and in good contact with reality.  Inspection of her lower extremity shows that there has been adequate  compression as manifested by lineal wrinkles in the skin.  These are  rather deep ferrules and represent approximately 0.5-0.8 cm wrinkles.  The feet themselves retained a puffy +2 to 3 edematous texture.  There  is hyperemia.  There is a blister formation with disruption of the  epidermis.  There is moist interdigital peri wound.  The dorsalis pedis  pulses readily palpable in spite of the degree of edema.  No debridement  is needed.   ASSESSMENT:  Clinically improved edema with stasis changes.   PLAN:  We will continue the a multilayered compression wrap with  triamcinolone ointment.  We will had a interdigital lamb's wool between  the toes to prevent pressure necrosis.  We will increase the frequency  of these dressing changes to biweekly.  The nurse will see the patient  twice weekly for dressing changes as per above.  The physician will  reevaluate the patient in 2 weeks.      Harold A.  Tanda Rockers, M.D.  Electronically Signed     HAN/MEDQ  D:  02/24/2008  T:  02/25/2008  Job:  [id number]

## 2011-05-01 NOTE — Assessment & Plan Note (Signed)
[facility]   [name], [name]                  ACCOUNT NO.:  [id number]   MEDICAL RECORD NO.:  [id number]      DATE OF BIRTH:  March 24, 1951   PHYSICIAN:  Maxwell Caul, M.D. VISIT DATE:  06/10/2008                                   OFFICE VISIT   HISTORY:  [name] is a patient here we have been following for severe  bilateral venous stasis.  Most recently she has had TCA ointment in  bilateral Profores.  She has had one problematic wound labeled #7 in the  left dorsal foot just distal to her ankle.   PHYSICAL EXAMINATION:  VITAL SIGNS:  Temperature is 98.5, pulse 99,  respirations 18, blood pressure 165/77.  RESPIRATORY:  Clear air entry bilaterally.  CARDIAC:  There are no signs of failure.  EXTREMITIES:  There is good edema control.  Peripheral pulses are well  palpable in her feet.  She has severe bilateral venostasis.  The area  which we previously labeled #6 appears to have a covering layer of  epithelium.  I am really not certain this has completely closed over,  although I did not probe this further.  I think it needs another looks  next week.  The remaining open area on the left dorsal foot is well  granulated.  There is a small amount of epithelialization.   IMPRESSION:  Venostasis ulceration.  We applied Hydrofera to the wound  labeled #7.  We continued with TCA, Profore wraps bilaterally.  We had  an extensive discussion about her ability to keep her legs down for  walking short distances or going to activities.  I think this is  perfectly reasonable and I wrote an order to for the effective  [facility] that she can ambulate for short distances,  keep her legs dependent in a wheelchair so she can go to activities.   We will continue to follow this wound.  Right now, I am hopeful with  simple measures.  This will heal over.           ______________________________  Maxwell Caul, M.D.     MGR/MEDQ  D:  06/10/2008   T:  06/11/2008  Job:  [id number]

## 2011-05-01 NOTE — Assessment & Plan Note (Signed)
[facility]   [name], [name]                  ACCOUNT NO.:  [id number]   MEDICAL RECORD NO.:  [id number]      DATE OF BIRTH:  09-May-1951   PHYSICIAN:  Jonelle Sports. Sevier, M.D.  VISIT DATE:  02/18/2008                                   OFFICE VISIT   HISTORY:  This 60 year old white female who is in an assisted living  center with severe chronic edema of the lower extremities and extensive  exfoliation ulceration especially to involve the dorsum of the feet and  toes. Who is seen for her second visit.  At her initial visit, she was  treated with wide spread application of Silvadene and her extremities  were wrap with Profore's.   RECOMMENDATIONS:  Was sent to the facility to keep her with her feet  elevated, but apparently she has steadfastly refused this and spends  most her days sitting up in a hallway in a wheelchair.   Apparently, the patient herself is unaware that things are in any way  worse, nor is there any report from the facility to suggest that.   PHYSICAL EXAMINATION:  Blood pressure is 133/62, pulse is 90,  respirations 20, temperature 96.9. The patient's lower extremities are  far less edematous with the exception of the toes which have been of  course not included her previous wrap.  There is dramatic improvement  with much fewer areas of superficial ulceration and desquamation.   Unfortunately, the toes remain grossly edematous and essentially denuded  with partial-thickness skin loss over the distal third of the foot  dorsally and over the toes dorsally and mediolaterally.  This is  impossible absolutely is to measure.   IMPRESSION:  Chronic edema with ulceration feet and legs, legs improved  with feet unimproved.   DISPOSITION:  The patient is cautioned regarding the necessity for  elevating her feet.  She absolutely refuses to stay in bed.  Orders were  sent to the facility requesting that they would attempt to reinforce her  staying  in bed and that if she refuses to do this that they obtain  either a recliner or a special wheelchair or Gerichair that will allow  her to be positioned so that her heels are all times higher than her  hips.  What cooperation we will get with this remains to be seen.   In the meanwhile, we will treat her with continuation of widespread  Silvadene application and the wraps will be returned to Profore wraps  reinforced with ABDs bilaterally.  The toes are similarly so treated and  lamb's wool pledgets were placed between the toes.   The facility would not be asked to do any ongoing care accept keep these  dressings clean and dry and to sure elevation as previously indicated.   Followup visit will be here in 1 week.  If at that time there has been  no additional cooperation in achieving elevation, I sincerely believe we  should consider discharging the patient from this clinic.           ______________________________  Jonelle Sports. Cheryll Cockayne, M.D.     RES/MEDQ  D:  02/18/2008  T:  02/18/2008  Job:  [id number]

## 2011-05-01 NOTE — Consult Note (Signed)
[name], [name]                  ACCOUNT NO.:  [id number]   MEDICAL RECORD NO.:  [id number]          PATIENT TYPE:  REC   LOCATION:  FOOT                         FACILITY:  [facility]   PHYSICIAN:  Jonelle Sports. Sevier, M.D. DATE OF BIRTH:  12-Oct-1951   DATE OF CONSULTATION:  02/11/2008  DATE OF DISCHARGE:                                 CONSULTATION   [facility] Center Consultation   HISTORY:  This 60 year old white female is seen at the courtesy of Dr.  Windle Guard for management of stasis dermatitis and ulcerations of the  lower extremities.   The patient has a complex medical history as will be outlined below.  She has been troubled with chronic venous insufficiency and edema for a  number of years; and has from, time to time, had problems with  cellulitis.  Her most significant bout of this occurred in December 2008  at which time she was hospitalized and treated with intravenous  antibiotics.  Subsequent to that, she was managed with Unna wraps, and  apparently had shown some progress.  Unfortunately she became febrile,  again, and required readmission on February 11 with a similar situation  with scaly ulcerations in both lower extremities to include the toes,  and with evidence of cellulitis, and suspected deeper infection.   During that hospitalization she was treated empirically with antibiotics  and showed improvement.  She was discharged on February 16 and  arrangements were made for her to be followed here for further  management of her lower extremity ulcerations.   PAST MEDICAL HISTORY:  Notable for hospitalizations for seizure  disorder.   SURGERIES INCLUDE:  1. 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  2. A right open reduction internal fixation of the right knee in the      1990s as well.  3. She had a hysterectomy prior to that in the 1980s.   REGULAR MEDICATIONS INCLUDE:  1. Lasix 80 mg daily in the morning.  2. Depakote 500 mg daily.  3.  Synthroid 0.1 mg daily.  4. Lasix 40 mg nightly at bedtime.  5. Claritin 10 mg daily.   FAMILY HISTORY:  Not available in detail, but apparently is positive for  coronary artery disease; but negative for seizure disorder, diabetes, or  stroke.   REVIEW OF SYSTEMS:  The patient presently is quite inactive, and has no  serious medical complaints.  She does have some evidence of allergies.  Venous insufficiency as noted.  She is known to be chronically anemic,  and this has been stable, and evaluation has failed to reveal a serious  basis for it.  She has controlled hypertension.  Her seizure disorder  apparently is controlled with her Depakote.  She does have some  essential tremor.  Her primary hypothyroidism is compensated.  She  denies any major digestive problems or any known history of carcinoma.   PHYSICAL EXAMINATION:  VITAL SIGNS:  Blood pressure is 141/66, pulse  107, respirations 18, temperature 97.7.  GENERAL:  The patient appears to be a poorly condition chronically ill  female appearing much older than her stated age.  FOCUSED EXAM:  Further examination is limited to the lower extremities  where she has severe bilateral edema, slightly worse on the right than  on the left, with several areas of fluid-filled blisters and numerous  areas open partial-thickness ulcerations where previous blisters have  ruptured.  She has extreme accumulation of separated epithelial layers  throughout both extremities and onto the toes.  These areas are  described and measured, in detail, by the nurse; and that will not all  be repeated here, as it is on record in her chart.  She has no  substantial orthopedic deformity in those extremities.  She has pulses  that are faintly palpable because of her edema, but on handheld Doppler  determination are found to be triphasic at all locations; both dorsalis  pedis and posterior tibial in both lower extremities.   IMPRESSION:  Chronic venous  insufficiency with stasis dermatitis and  superficial ulcerations bilaterally.   DISPOSITION:  1. A large amount of the scaly separated epithelium is removed from      both extremities, cautiously, and without provoking any further      difficulties.  With so many widely opened superficial ulcerations,      the extremities are subjected to total application, below the      knees, of Silvadene cream; and then both extremities are wrapped in      Profore wraps with reinforcement with ABD pads between the first      two layers.   Instructions are sent with the patient to her receiving facility to keep  these dressings clean and dry, to continue all the patient's usual  medications, to elevate her feet whenever possible both day and night,  and finally to call us should the dressings saturate through.  Assuming  this does not happen, the patient will be seen, again, for followup in 1  week.           ______________________________  Jonelle Sports. Cheryll Cockayne, M.D.     RES/MEDQ  D:  02/11/2008  T:  02/12/2008  Job:  [id number]
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
## 2011-05-01 NOTE — H&P (Signed)
NAMEJANELIZ, [name]                  ACCOUNT NO.:  [id number]   MEDICAL RECORD NO.:  [id number]          PATIENT TYPE:  INP   LOCATION:  0101                         FACILITY:  [facility]   PHYSICIAN:  Marcellus Scott, MD     DATE OF BIRTH:  [birth year]   DATE OF ADMISSION:  01/28/2008  DATE OF DISCHARGE:                              HISTORY & PHYSICAL   STAT ADMISSION HISTORY AND PHYSICAL   PRIMARY CARE PHYSICIAN:  Windle Guard, MD.   ORTHOPEDIC PHYSICIAN:  W.D. Caffrey, MD.   CHIEF COMPLAINT:  Bilateral lower extremity swelling, redness, and  oozing.   HISTORY OF PRESENTING ILLNESS:  [name] is a pleasant, 60 year old,  Caucasian female patient resident of [facility], who  has history of chronic bilateral extremity venous insufficiency,  hypertension, hypothyroidism, and seizure disorder.  She was recently  admitted to the [facility] Internal Medicine service in  December of 2008 with bilateral lower extremity cellulitis.  The patient  was treated with IV antibiotics - Zosyn and Vancomycin, and subsequently  discharged to the retirement place on IV antibiotics.  The patient  indicated that her legs had significantly improved. She last saw Dr.  Madelon Lips two weeks ago and is supposed to see him again on the 24th of  this month.  She had her UNNA boots changed weekly.  According to her,  at the last dressing change a week ago, the legs looked better with no  redness, worsening swelling, or pain.  However today when the boots were  taken down there was a significant amount of yellow, scaly material on  her feet.  She, however, indicates that her leg swellings are actually  better than before.  The redness is not anything that is worse when  compared to her usual state, and she has pain in the lower extremities  only when she lets her feet hang down.  She denies any history of fever,  chills, or rigors.  Her legs were evaluated at the retirement place,  patient was  sent to the ER for further evaluation and management.   PAST MEDICAL HISTORY:  1. Bilateral lower extremity chronic venous insufficiency.  2. History of bilateral lower extremity cellulitis.  3. Seizure disorder.  4. Hypertension.  5. Essential tremor.  6. Hypothyroidism.   PAST SURGICAL HISTORY:  1. Right total hip replacement.  2. Left hip hemiarthroplasty.  3. Right knee open reduction and internal fixation of patella      fracture.   ALLERGIES:  1. MORPHINE.  2. CODEINE.   MEDICATIONS:  1. Lasix 80 mg p.o. daily and 40 mg p.o. q.h.s.  2. Depakote-ER 500 mg p.o. twice daily.  3. Levothyroxine 0.1 mg p.o. q.h.s.  4. Loratadine 10 mg p.o. daily.  5. Meloxicam 7.5 mg p.o. daily.  6. Darvocet one tablet by mouth q.4h p.r.n.  7. Tums Regular Strength.  8. Cough drops p.r.n.  9. Benadryl 25 mg, one to two capsules by mouth q.4h p.r.n.   FAMILY HISTORY:  Noncontributory.   SOCIAL HISTORY:  The patient is divorced.  She is  on disability.  There  is no history of smoking, alcohol, or drug abuse.   REVIEW OF SYSTEMS:  Fourteen systems reviewed and apart from the history  of presenting illness is noncontributory.  Patient indicates that she is  not keen on staying in the hospital and would like to return as soon as  possible.   PHYSICAL EXAMINATION:  GENERAL:  [name] is a moderately built and  nourished female patient in no obvious distress.  VITAL SIGNS:  Temperature 97.6 degrees Fahrenheit, blood pressure 149/54  mmHg, pulse 94 per minute and regular, respiration 20 per minute, she  was saturating at 98% on room air.  HEAD, EYE, ENT:  Nontraumatic, normocephalic.  Pupils equally reacting  to light and accommodation.  No oropharyngeal erythema or thrush.  NECK:  Supple.  No JVD or carotid bruit.  LYMPHATICS:  No lymphadenopathy.  BREASTS:  Exam deferred.  RESPIRATORY SYSTEM:  Clear to auscultation.  CARDIOVASCULAR SYSTEM:  First and second heart sounds heard.  No third   or fourth heart sounds or murmurs.  ABDOMEN:  Nondistended and nontender.  No organomegaly or mass  appreciated.  Bowel sounds are normally heard.  CENTRAL NERVOUS SYSTEM:  The patient is awake, alert, oriented x3.  No  focal neurological deficits.  EXTREMITIES:  With no cyanosis or clubbing.  There is 2 to 3+ pitting  edema all the way up to the thighs, but more so below the knees.  There  is chronic thickening of the skin below the knees, both lower  extremities.  There is also erythema of both lower extremities below the  knee, the left greater than the right, which seems chronic.  All these  findings on the left lower extremity are worse than the right.  There is  a yellowish maceration of the dorsum of the skin of the toes, again the  left greater than the right, and also the same findings on the lateral  aspect of the left leg.  There is blistering of the skin on both lower  extremities with some oozing.  The patient has tremors.   LABORATORY DATA:  Point-of-care cardiac markers are negative, serum  valproate level is negative at 0.7. brain natruretic peptide is 81.500,  basic metabolic panel remarkable for BUN 32, creatinine 1.03, hepatic  panel remarkable for albumin of 3, CBC is with hemoglobin 10.9,  hematocrit 32.2, white blood cells 6.7, platelets 197.   ASSESSMENT AND PLAN:  1. Chronic venous insufficiency, both lower extremities.  Will admit      patient to medical floor.  Will change Lasix to IV and monitor      strict input, output, and daily weights.  2. Question bilateral lower extremity cellulitis, the left greater      than the right.  Will admit patient.  Will obtain blood cultures if      febrile.  Will empirically place on intravenous Zosyn and      Vancomycin.  Will obtain Wound Care consultation and consult Dr.      Madelon Lips in the a.m.  Unsure if her lower extremities are any      significantly different apart from the sloughing on the dorsum of      the toes  from her baseline.  Will await the orthopedician's review      and consider discontinuing her antibiotics promptly since the      patient says her swelling is better, no fever, and no pain.      Consider  ID consult.  3. Seizure disorder, to continue Depakote.  4. Hypertension, controlled.  5. Essential tremor, on no medications.  6. Hypothyroidism.      Marcellus Scott, MD  Electronically Signed     AH/MEDQ  D:  01/28/2008  T:  01/28/2008  Job:  [id number]   cc:   Windle Guard, M.D.  Fax: [phone]   Dyke Brackett, M.D.  Fax: [phone]

## 2011-05-01 NOTE — Discharge Summary (Signed)
[name], [name]                  ACCOUNT NO.:  [id number]   MEDICAL RECORD NO.:  [id number]          PATIENT TYPE:  INP   LOCATION:  1520                         FACILITY:  [facility]   PHYSICIAN:  Marcellus Scott, MD     DATE OF BIRTH:  1951/05/26   DATE OF ADMISSION:  01/28/2008  DATE OF DISCHARGE:                               DISCHARGE SUMMARY   DATE OF DISCHARGE:  To be determined.   PRIMARY CARE PHYSICIAN:  Windle Guard, M.D.   ORTHOPEDIC SURGEON:  Dyke Brackett, M.D.   DISCHARGE DIAGNOSES:  1. Bilateral lower extremity polymicrobial cellulitis with superficial      wound.  2. Chronic bilateral lower extremity venous insufficiency.  3. Hypoxia, resolved.  4. Thrombocytopenia secondary to infection versus antibiotics,      improving.  5. Anemia, stable.  6. Seizure disorder.  7. Hypertension.  8. Essential tremors.  9. Hypothyroidism.   DISCHARGE MEDICATIONS:  To be updated and finalized at discharge.  1. Antibiotic choice and duration to be determined after final      cultures and infectious disease input.  2. Lasix 60 mg p.o. b.i.d.  3. Depakote ER 500 mg p.o. twice daily.  4. Levothyroxine 0.1 mg p.o. nightly.  5. Loratadine 10 mg p.o. daily.  6. Darvocet-N 100 one tablet p.o. q. 4 h p.r.n.   PROCEDURES:  Chest x-ray on January 29, 2008.  Impression:  Bibasilar  atelectasis or infiltrate.   PERTINENT LABORATORY DATA:  Wound culture preliminary report is  Staphylococcus aureus, final cultures pending.  CBC:  Hemoglobin 11.1,  hematocrit 32.1, white blood cells 6.1, platelets 100.  Basic metabolic  panel normal with BUN 19, creatinine 0.94.  Heparin and antibody screen  negative.  Blood cultures are no growth to date.  TSH is 2.081.  Valproic acid level 97.7.  BNP 81.5.   CONSULTATIONS:  1. Infectious disease, Cliffton Asters, M.D.  2. Orthopedic surgeon, Dyke Brackett, M.D.  3. [facility] [place]. Hart Rochester, M.D.   HOSPITAL COURSE  AND PATIENT DISPOSITION:  Please refer to History and  Physical note by this dictator for initial admission details.  In  Summary, [name] is a pleasant 60 year old female, resident of  [facility], with history of chronic bilateral lower  extremity venous insufficiency, previous cellulitis of the lower  extremities, hypertension, hypothyroidism, seizure disorder who was  transferred from the retirement place with history of foul-smelling  discharge from the lower extremities with increasing redness, swelling,  and pain.  Further evaluation in the emergency room was suggestive of  cellulitis of the lower extremities whereby patient was admitted to the  hospital for further evaluation and management.   #1.  BILATERAL LOWER EXTREMITY POLYMICROBIAL CELLULITIS, LEFT GREATER  THAN RIGHT:  The patient has had recurrent episodes of cellulitis.  This  is in the background of chronic lower extremity venous insufficiency.  The patient was admitted to the medical floor.  Cultures were sent off.  The patient was empirically placed on IV Zosyn and vancomycin.  Wound  care consult, orthopedic  consult, infectious Disease consult was  obtained.  Infectious disease have continued to follow her and have  today discontinued Zosyn and switched her to Augmentin and vancomycin  pending final culture results.  Orthopedics saw her and recommended  [facility] consult who have seen and recommended  dressing changes, feet elevation, Unna boots after discharge, to be  followed by elastic compression stockings to minimize edema.  They would  like to see her in the outpatient clinic for good venous duplex exam and  further management as deemed necessary.  Once her final cultures are  out, the patient may be able to go in the next day or two on oral  antibiotics based on infectious disease recommendations.   #2.  CHRONIC BILATERAL LOWER EXTREMITY VENOUS INSUFFICIENCY:  Management   per #1.  The patient was also placed on IV Lasix briefly with  significant improvement in edema.   #3.  HYPOXIA.  This was transient, and patient was asymptomatic of this.  Chest x-ray was done, and there was no clinical suspicion of pneumonia.  This has promptly resolved.   #4.  THROMBOCYTOPENIA:  This might have been secondary to sepsis or  antibiotics.  Lovenox was held, and platelet count is improving.  Follow  up CBCs as outpatient.   DISPOSITION:  The patient will  potentially be discharged in the next 24-  48 hours with out patient followup by [facility],  wound care, orthopedics.      Marcellus Scott, MD  Electronically Signed     AH/MEDQ  D:  02/02/2008  T:  02/02/2008  Job:  [id number]   cc:   Windle Guard, M.D.  Fax: [phone]   Dyke Brackett, M.D.  Fax: [phone]   Quita Skye. Hart Rochester, M.D.  [address]  [place]  [place] [address]

## 2011-05-01 NOTE — Consult Note (Signed)
NAMETERRESA, [name]                  ACCOUNT NO.:  [id number]   MEDICAL RECORD NO.:  [id number]          PATIENT TYPE:  INP   LOCATION:  1520                         FACILITY:  [facility]   PHYSICIAN:  Quita Skye. Hart Rochester, M.D.  DATE OF BIRTH:  [birth year]   DATE OF CONSULTATION:  01/31/2008  DATE OF DISCHARGE:                                 CONSULTATION   CHIEF COMPLAINT:  Chronic venostasis ulcers both lower extremities.   HISTORY OF PRESENT ILLNESS:  [name] is a 60 year old female patient who  resides [facility] with history of chronic bilateral  venous insufficiency and ulcerations.  She has been followed by Dr. Kristeen Miss for orthopedic issues.  She was admitted in December 2008, with  bilateral lower extremity cellulitis and was treated with IV antibiotics  including Zosyn and vancomycin.  She was readmitted January 28, 2008,  with a similar situation with some scaly ulcerations in both lower  extremities, has been treated with weekly Unna boots since her December  hospitalization with improvement in the ulcers.  She had no history of  fever, chills or sepsis.  She denies any history of deep venous  thrombosis, thrombophlebitis, pulmonary emboli and does not know when  she had her venous ultrasound procedure performed.   PAST MEDICAL HISTORY:  1. Chronic edema with bilateral venous insufficiency.  2. Seizure disorder.  3. Hypertension.  4. Essential tremor.  5. Hypothyroidism.  6. Negative for coronary artery disease, diabetes or stroke.   PAST SURGICAL HISTORY:  1. Right total hip replacement.  2. Left hip hemiarthroplasty.  3. Right knee open reduction internal fixation.   ALLERGIES:  MORPHINE and CODEINE.   FAMILY HISTORY:  Positive for coronary artery disease in her father.  Negative for diabetes or stroke.   SOCIAL HISTORY:  The patient is divorced, is on disability, lives in  [facility] retirement facility, no history of smoking, alcohol or drug  abuse.   PHYSICAL EXAMINATION:  VITAL SIGNS:  Blood pressure is 150/60, heart  rate 80, respirations 14 and temperature 98.  GENERAL:  She is a frail female patient who is in no apparent distress,  alert and oriented x3.  NECK:  Supple, 3+ carotid pulses palpable.  No bruits are audible.  No  palpable adenopathy.  Upper extremity pulses are 3+ bilaterally.  CHEST:  Clear to auscultation.  ABDOMEN:  Soft, nontender with no palpable masses.  EXTREMITIES:  Exam reveals 3+ femoral popliteal and dorsalis pedis  pulses easily palpable.  Both feet are warm and well-perfused.  Left leg  has 2 to 3+ edema throughout.  Right leg has 1 to 2+ edema.  There is  scaly hyperpigmentation and ulceration in both lower extremities with  the appearance of chronic venous insufficiency with severe scaly rash  from the knee distally.  There are no severe deep ulcerations but  multiple superficial ulcerations involving both ankles and feet and the  toes of both feet.  No pressure sores are noted.   IMPRESSION:  Probable chronic venous insufficiency - uncertain whether  deep and superficial systems are involved  with class 6 CEAP  venous  disease with active ulcerations bilaterally.   RECOMMENDATIONS:  Would continue treating these as you are doing with  dressing changes, elevation, Unna boots after discharge to be followed  by elastic compression stockings if possible to minimize edema.  This  will be obviously a chronic ongoing problem.  I should see her in the  office for good venous Duplex exam to look at the deep and superficial  systems bilaterally to check to see if there is reflux in the  superficial systems if so.  She might benefit from laser ablation of her  superficial systems to improve the edema and the ulcerations.  Please  let us know after discharge so she can be scheduled for an point in my  office.      Quita Skye Hart Rochester, M.D.  Electronically Signed     JDL/MEDQ  D:  01/31/2008  T:   02/02/2008  Job:  [id number]   cc:   Marcellus Scott, MD

## 2011-05-01 NOTE — Discharge Summary (Signed)
NAMEANORAH, TRIAS                  ACCOUNT NO.:  [id number]   MEDICAL RECORD NO.:  [id number]          PATIENT TYPE:  REC   LOCATION:  FOOT                           FACILITY:   PHYSICIAN:  Lenon Curt. Chilton Si, M.D.  DATE OF BIRTH:  01-18-1951   DATE OF ADMISSION:  08/13/2008  DATE OF DISCHARGE:                               DISCHARGE SUMMARY   HISTORY:  This 60 year old resident of [facility] nursing facility has  been seen in the wound care clinic for several weeks for chronic  ulceration of the dorsum of the left foot.  The wound does appear to be  healing at the present time, according to staff that have seen it  before.  She has chronic edema of the lower extremities and has been in  [facility] and Profore dressings.  She is getting changes weekly.   The wound on the dorsum of the left foot measures 0.8 by 1.3 by 0.1  depth.   VITAL SIGNS:  Temperature 98.5, pulse 98, respirations 20, blood  pressure 157/76.   Patient is accompanied by an attendant from the nursing home.   Wound has some healing at the edges, across the top.   PLAN:  Due to improvement on current therapy, we have given orders to  continue the same treatment of Hydrofera Blue and Profore dressing.  Patient to return for nurse change of the dressing in one week and  physician evaluation in two weeks.   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      Lenon Curt Chilton Si, M.D.  Electronically Signed     AGG/MEDQ  D:  08/20/2008  T:  08/20/2008  Job:  [id number]

## 2011-05-01 NOTE — Assessment & Plan Note (Signed)
[facility]   NAMEARELIE, KUZEL                  ACCOUNT NO.:  [id number]   MEDICAL RECORD NO.:  [id number]      DATE OF BIRTH:  [birth year]   PHYSICIAN:  Maxwell Caul, M.D. VISIT DATE:  10/07/2008                                   OFFICE VISIT   Mrs. Gosney is a lady who we have been following largely for bilateral  venous stasis disease with stasis ulceration.  She is a resident of  [facility].  Home health is following her for Profore  Lite wraps, which are changed weekly (this is advanced home care).  Unfortunately, 2-3 weeks ago, she had a fall.  It sounds as though she  has a very proximal fracture of her left humerus. This has simply been  put in sling while the orthopedic surgeon decides what to do about this,  although I suspect she would not be a good surgical candidate.   On examination, she is afebrile.  We did not attempt to move her off the  stretcher due to her arm.  We therefore did not change the current  Profore Lite wraps.   IMPRESSION:  Venous stasis ulceration.   In view of difficulties in transporting her, her recent fracture, I will  attempt to follow this in the facility with home health.  We will see  her next week on Tuesday at 2 o'clock after which the home health cab re-  wrap her legs.  Not surprisingly the edema that is present was much  better, now that her mobility is less and she keeps her legs elevated in  bed.   I really do not see a good reason for her to be followed here in the  short term, and I have discharged her, however, further services from  this center may be necessary.           ______________________________  Maxwell Caul, M.D.     MGR/MEDQ  D:  10/07/2008  T:  10/08/2008  Job:  [id number]

## 2011-05-01 NOTE — H&P (Signed)
[name], [name]                  ACCOUNT NO.:  [id number]   MEDICAL RECORD NO.:  [id number]          PATIENT TYPE:  REC   LOCATION:  FOOT                         FACILITY:  [facility]   PHYSICIAN:  Kristen Sports. Sevier, M.D. DATE OF BIRTH:  1951-01-02   DATE OF ADMISSION:  05/11/2008  DATE OF DISCHARGE:                              HISTORY & PHYSICAL   [name] is a lady we have been following for difficult wounds in the  setting of bilateral venous stasis.  Most recently, she had Hydrofera  Blue applied to 1 remaining open area on her left anterior ankle.  Both  her legs have received TCA and Profore.  She is here today in followup  having no any additional complaints.   PHYSICAL EXAMINATION:  VITALS:  Temperature is 97.5, pulse 81,  respirations 18, and blood pressure 150/76.   We have really decent edema control, although there is still some  swelling.  The previous area over the right medial malleolus is fully  epithelialized.  I did not disturb the small amount of scab over the top  of this.  However, it looks to be a non-issue at this point.  The  remaining wound that we have labeled #7 is on the left dorsal foot  measuring 2 x 2.3 x 0.2.  There is some tendon exposure here, which may  be a new phenomenon, as I cannot see this described before.  However, it  is surrounded by healthy-appearing granulation, and I am hopeful that  this will granulate over.   IMPRESSION:  Venous stasis disease.  We reapplied the Profore and  Hydrofera over the wound on the left anterior foot.  We will see this  again in a week's time.  There is no evidence of arterial insufficiency  here and no evidence of infection.     ______________________________  Maxwell Caul, M.D.    ______________________________  [name], M.D.    MGR/MEDQ  D:  06/24/2008  T:  06/25/2008  Job:  [id number]

## 2011-05-01 NOTE — Assessment & Plan Note (Signed)
[facility]   NAMEVELLA, COLQUITT                  ACCOUNT NO.:  [id number]   MEDICAL RECORD NO.:  [id number]            DATE OF BIRTH:   PHYSICIAN:  Theresia Majors. Tanda Rockers, M.D. VISIT DATE:  03/23/2008                                   OFFICE VISIT   SUBJECTIVE:  Ms. [name] is a 60 year old lady who we are following for  bilateral stasis ulcers associated with severe fluid retention.  In the  interim we had placed her on weekly Profore wraps via the Home Health  nurse.  The patient returns for followup.  She was transported by EMS  and was unaccompanied otherwise.   Her blood pressure is 131/68, respirations are 20, pulse rate 100,  temperature is 98.1.  Inspection of the lower extremity shows a persistence of edema judged at  2+ to 3+.  The legs are shiny with blisters and weeping.  The previous  wound #2 on the right lower leg shows some deterioration.  In fact, is  considered recurrent.  There is hyperemia but no malodor.  In addition,  there are new wounds present now; wounds 5 and 6 over the right medial  lower extremity and the left dorsal foot.  These wounds appear to be  related to wrap injuries.  In spite of significant edema, pedal pulses  are still discernible.  The feet are symmetrically warm but are not  feverish.   Utilizing EMLA topical anesthetic, selective debridement was performed  with excision of exudate and partial skin.  There was no resulting  hemorrhage.   ASSESSMENT:  Bilateral stasis ulcer with new wrap injuries.   PLAN:  We have suggested that the patient be seen in the wound center  biweekly for Profore wraps.  We placed Silverlon swaths over wounds # 2,  5, and 6 and placed triamcinolone ointment over both legs.  (Please  refer to the data entry and the photos).  We will reevaluate the patient  biweekly in the clinic and in 2 weeks per the nurse.      Harold A. Tanda Rockers, M.D.  Electronically Signed     HAN/MEDQ  D:  03/23/2008  T:   03/23/2008  Job:  [id number]

## 2011-05-01 NOTE — Discharge Summary (Signed)
[name], [name]                  ACCOUNT NO.:  [id number]   MEDICAL RECORD NO.:  [id number]          PATIENT TYPE:  INP   LOCATION:  1520                         FACILITY:  [facility]   PHYSICIAN:  Isidor Holts, M.D.  DATE OF BIRTH:  Apr 10, 1951   DATE OF ADMISSION:  01/28/2008  DATE OF DISCHARGE:  02/05/2008                               DISCHARGE SUMMARY   PRIMARY MEDICAL DOCTOR:  Dr. Windle Guard.   ORTHOPEDIC SURGEON:  Dr. Madelon Lips.   For discharge diagnoses, consultations, discharge medications, admission  history, detailed clinical course, as well as procedures, refer to  interim discharge summary dictated February 02, 2008 by Dr. Marcellus Scott.  For the subsequent period from February 03, 2008 to February 04, 2008, the patient has done very well.  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  Hemoglobin was 11.6 on February 04, 2008 with a  hematocrit of 33.5, platelet count of 177, white cell count 7.3.  There  were no new issues.  Bilateral lower extremity redness and swelling had  markedly improved, and the patient continued to receive local  care/dressing by the wound management care team.  She was reviewed by  Dr. Orvan Falconer, infectious disease specialist, on February 03, 2008, and  recommended a total of 2 weeks of antibiotic therapy, to be completed on  February 11, 2008.  There were no seizures documented during the course  of the patient's hospitalization.  She was placed on a 5-day course of  Diflucan for vaginal candidiasis, to be completed on February 07, 2008.  Blood pressure was significantly elevated at 172/72 mmHg on February 04, 2008.  The patient was therefore commenced on 10 mg of Lisinopril,  daily.  We expect the patient's primary M.D. will monitor her blood  pressures, and adjust medications as indicated.   DISPOSITION:  The patient was on February 04, 2008 considered  sufficiently clinically recovered and stable to be discharged on  February 05, 2008,  provided no acute problems arise in the interim.  Home health PT, OT and R.N. have been arranged.  The patient has been  recommended follow-up at the wound care clinic, following discharge.  She is also to continue to follow up with Dr. Madelon Lips, her orthopedic  surgeon, per prior scheduled appointment, and with vascular specialist,  at a date to be determined.  She is to follow-up with her primary M.D.,  Dr. Windle Guard.   DISCHARGE MEDICATIONS:  As in interim summary of February 02, 2008.  In  addition:  1. Augmentin 875 mg p.o. b.i.d. to be completed on February 11, 2008.  2. Diflucan 100 mg p.o. daily to be completed on February 07, 2008.  3. Lotrimin 1% cream applied topically to affected areas of colon      b.i.d.  4. Lisinopril 10 mg p.o. daily.  5. Tums, in pre-admission dose.   Note:  Benadryl and Mobic have been discontinued.      Isidor Holts, M.D.  Electronically Signed     CO/MEDQ  D:  02/04/2008  T:  02/04/2008  Job:  [id number]  cc:   Windle Guard, M.D.  Fax: [phone]   Dyke Brackett, M.D.  Fax: [phone]   Quita Skye. Hart Rochester, M.D.  [address]  [place]  [place] [address]

## 2011-05-04 NOTE — Op Note (Signed)
NAME:  [name], [name]                            ACCOUNT NO.:  [id number]   MEDICAL RECORD NO.:  [id number]                   PATIENT TYPE:  OIB   LOCATION:  2550                                 FACILITY:  [facility]   PHYSICIAN:  Thera Flake., M.D.             DATE OF BIRTH:  1951-09-24   DATE OF PROCEDURE:  03/15/2004  DATE OF DISCHARGE:                                 OPERATIVE REPORT   PREOPERATIVE DIAGNOSIS:  Displaced patellar fracture with partial patellar  tendon rupture.   POSTOPERATIVE DIAGNOSIS:  Displaced patellar fracture with partial patellar  tendon rupture.   OPERATION:  Open reduction and internal fixation of patella with repair of  patellar tendon.   INDICATIONS:  The patient is a 60 year old who fell.  I was not on call, but  she was an old patient of mine.  She had a displaced patella fracture with  possible involvement of the patellar tendon felt to be amenable to  outpatient surgery.   SURGEON:  Dyke Brackett, M.D.   ASSISTANT:  Jamelle Rushing, P.A.   TOURNIQUET TIME:  40 minutes.   DESCRIPTION OF PROCEDURE:  Sterile prep and drape.  Exsanguination of the  leg, placed the tourniquet at 375.  A longitudinal incision, identification  of the transverse fracture of the patella with complete displacement.  Very  osteoporotic bone, thought to be contraindicating metal fixation.  Provisional fixation was carried out actually with two #5 Fibrewires with  oversewing a partial rent in the tendon as well.  This repaired the area  nicely anatomically.  The wound was irrigated.  The joint was inspected  prior to this.  Closure was effected with 2-0 Vicryl and skin clips.  Marcaine with epinephrine infiltrated in the skin.  A lightly compressive  sterile dressing and a long-leg splint applied.                                               Thera Flake., M.D.    WDC/MEDQ  D:  03/15/2004  T:  03/15/2004  Job:  [id number]

## 2011-05-04 NOTE — Discharge Summary (Signed)
NAMESHEENAH, [name] NO.:  [id number]   MEDICAL RECORD NO.:  [id number]          PATIENT TYPE:  INP   LOCATION:  5009                         FACILITY:  [facility]   PHYSICIAN:  Tacey Ruiz, MD    DATE OF BIRTH:  [birth year]   DATE OF ADMISSION:  12/09/2007  DATE OF DISCHARGE:  12/16/2007                               DISCHARGE SUMMARY   ADDENDUM   [name] was not discharged on December 29 as planned due to  complications of home IV antibiotic dosing.  Home Health Nursing care  could only provide home antibiotic dosing no more than 2 times per day  for maximum of 5 days and patient was on Zosyn 4 times per day;  therefore, pharmacy was consulted and this medication was changed to  ertapenem 1 gm daily. We will continue this for 4 days for a total  antibiotic course of 10 days. The patient was also discharged home on  vanc every 72 hours with her next dose being December 19, 2007 and then  the patient will require no other doses. Finally, per PT  recommendations, the patient's dressing were to be changed to every 7  days with the next dressing due on December 19, 2007 as well. The  patient's vitals remained stable overnight, there was no change in the  patient's status. The patient was discharged home to her assisted living  facility on December 16, 2007.      Tacey Ruiz, MD  Electronically Signed     JP/MEDQ  D:  12/17/2007  T:  12/17/2007  Job:  [id number]

## 2011-09-07 LAB — COMPREHENSIVE METABOLIC PANEL
AST: 18
BUN: 32 — ABNORMAL HIGH
CO2: 29
Calcium: 9.2
Chloride: 103
Creatinine, Ser: 1.03
GFR calc Af Amer: >60
GFR calc non Af Amer: 55 — ABNORMAL LOW
Total Bilirubin: 0.4

## 2011-09-07 LAB — CBC
HCT: 29.9 — ABNORMAL LOW
HCT: 32.2 — ABNORMAL LOW
HCT: 32.7 — ABNORMAL LOW
HCT: 31 — ABNORMAL LOW
HCT: 32.2 — ABNORMAL LOW
HCT: 33.5 — ABNORMAL LOW
Hemoglobin: 10.7 — ABNORMAL LOW
Hemoglobin: 11.1 — ABNORMAL LOW
Hemoglobin: 11.4 — ABNORMAL LOW
Hemoglobin: 11.6 — ABNORMAL LOW
MCHC: 32.9
MCHC: 33.9
MCHC: 34.5
MCHC: 34.6
MCHC: 34.6
MCV: 88.8
MCV: 90.4
MCV: 90.5
MCV: 91.2
MCV: 90.2
Platelets: 98 — ABNORMAL LOW
Platelets: 86 — ABNORMAL LOW
Platelets: 88 — ABNORMAL LOW
RBC: 3.56 — ABNORMAL LOW
RBC: 3.59 — ABNORMAL LOW
RBC: 3.59 — ABNORMAL LOW
RBC: 3.45 — ABNORMAL LOW
RBC: 3.57 — ABNORMAL LOW
RBC: 3.6 — ABNORMAL LOW
RBC: 3.72 — ABNORMAL LOW
RDW: 16.4 — ABNORMAL HIGH
RDW: 16.6 — ABNORMAL HIGH
RDW: 16.2 — ABNORMAL HIGH
RDW: 16.6 — ABNORMAL HIGH
RDW: 16.7 — ABNORMAL HIGH
WBC: 12.5 — ABNORMAL HIGH
WBC: 6.7
WBC: 6.1
WBC: 7.3

## 2011-09-07 LAB — BASIC METABOLIC PANEL
BUN: 19
BUN: 24 — ABNORMAL HIGH
CO2: 25
CO2: 32
CO2: 33 — ABNORMAL HIGH
Calcium: 8.4
Calcium: 8.5
Calcium: 9
Chloride: 101
Creatinine, Ser: 1.12
GFR calc Af Amer: >60
GFR calc Af Amer: 53 — ABNORMAL LOW
GFR calc Af Amer: >60
GFR calc non Af Amer: 44 — ABNORMAL LOW
GFR calc non Af Amer: >60
GFR calc non Af Amer: >60
Glucose, Bld: 119 — ABNORMAL HIGH
Glucose, Bld: 101 — ABNORMAL HIGH
Glucose, Bld: 96
Potassium: 3.8
Potassium: 3.9
Sodium: 140
Sodium: 140

## 2011-09-07 LAB — DIFFERENTIAL
Basophils Absolute: 0.1
Eosinophils Relative: 2
Lymphocytes Relative: 27
Lymphs Abs: 1.8
Neutro Abs: 3.8
Neutrophils Relative %: 56

## 2011-09-07 LAB — WOUND CULTURE

## 2011-09-07 LAB — POCT CARDIAC MARKERS
Myoglobin, poc: 175
Operator id: 4661

## 2011-09-07 LAB — VALPROIC ACID LEVEL: Valproic Acid Lvl: 97.7

## 2011-09-07 LAB — CULTURE, BLOOD (ROUTINE X 2): Culture: NO GROWTH

## 2011-09-07 LAB — VANCOMYCIN, TROUGH: Vancomycin Tr: 26.4

## 2011-09-07 LAB — CREATININE, SERUM
GFR calc Af Amer: 41 — ABNORMAL LOW
GFR calc non Af Amer: 42 — ABNORMAL LOW

## 2011-09-07 LAB — TSH
TSH: 2.081
TSH: 3.27

## 2011-09-07 LAB — B-NATRIURETIC PEPTIDE (CONVERTED LAB): Pro B Natriuretic peptide (BNP): 81.5

## 2011-09-17 LAB — DIFFERENTIAL
Basophils Absolute: 0.1
Basophils Relative: 1
Eosinophils Absolute: 0.1
Eosinophils Relative: 2
Lymphocytes Relative: 24
Lymphs Abs: 1.7
Monocytes Absolute: 0.3
Monocytes Relative: 4
Neutro Abs: 4.8
Neutrophils Relative %: 69

## 2011-09-17 LAB — COMPREHENSIVE METABOLIC PANEL WITH GFR
ALT: 9
AST: 21
Albumin: 3.2 — ABNORMAL LOW
Alkaline Phosphatase: 316 — ABNORMAL HIGH
BUN: 20
CO2: 26
Calcium: 8.9
Chloride: 104
Creatinine, Ser: 0.79
GFR calc non Af Amer: >60
Glucose, Bld: 72
Potassium: 4.3
Sodium: 139
Total Bilirubin: 0.6
Total Protein: 7.2

## 2011-09-17 LAB — TYPE AND SCREEN
ABO/RH(D): AB POS
Antibody Screen: NEGATIVE

## 2011-09-17 LAB — CBC
HCT: 34.2 — ABNORMAL LOW
MCHC: 32.8
MCV: 98.1
Platelets: 471 — ABNORMAL HIGH
RDW: 20.9 — ABNORMAL HIGH
WBC: 6.9

## 2011-09-21 LAB — BASIC METABOLIC PANEL
BUN: 14
CO2: 27
CO2: 29
CO2: 29
CO2: 31
Calcium: 8.5
Calcium: 8.6
Calcium: 9.1
Calcium: 9.2
Chloride: 101
Creatinine, Ser: 0.92
Creatinine, Ser: 0.92
GFR calc Af Amer: >60
GFR calc Af Amer: >60
GFR calc Af Amer: >60
GFR calc Af Amer: >60
GFR calc non Af Amer: >60
GFR calc non Af Amer: >60
GFR calc non Af Amer: >60
Glucose, Bld: 130 — ABNORMAL HIGH
Glucose, Bld: 67 — ABNORMAL LOW
Glucose, Bld: 99
Potassium: 3.9
Sodium: 138
Sodium: 139
Sodium: 141
Sodium: 141

## 2011-09-21 LAB — URINALYSIS, ROUTINE W REFLEX MICROSCOPIC
Hgb urine dipstick: NEGATIVE
Specific Gravity, Urine: 1.025
Urobilinogen, UA: 1

## 2011-09-21 LAB — CBC
HCT: 32.6 — ABNORMAL LOW
Hemoglobin: 10.3 — ABNORMAL LOW
Hemoglobin: 10.8 — ABNORMAL LOW
Hemoglobin: 11.5 — ABNORMAL LOW
Hemoglobin: 11.8 — ABNORMAL LOW
Hemoglobin: 12
MCHC: 33
MCHC: 33.2
MCHC: 33.5
MCHC: 34
MCHC: 34.1
MCV: 91.7
MCV: 92
MCV: 92.4
Platelets: 248
Platelets: 297
RBC: 3.34 — ABNORMAL LOW
RBC: 3.58 — ABNORMAL LOW
RBC: 3.78 — ABNORMAL LOW
RBC: 3.87
RDW: 15.9 — ABNORMAL HIGH
RDW: 15.9 — ABNORMAL HIGH
RDW: 16.1 — ABNORMAL HIGH
RDW: 16.1 — ABNORMAL HIGH
RDW: 16.2 — ABNORMAL HIGH
WBC: 6.1
WBC: 8
WBC: 8.2

## 2011-09-21 LAB — COMPREHENSIVE METABOLIC PANEL
ALT: <8
AST: 23
Albumin: 3.5
Calcium: 9.2
Chloride: 105
Creatinine, Ser: 0.73
GFR calc Af Amer: >60
Sodium: 138

## 2011-09-21 LAB — CULTURE, BLOOD (ROUTINE X 2)

## 2011-09-21 LAB — B-NATRIURETIC PEPTIDE (CONVERTED LAB): Pro B Natriuretic peptide (BNP): 596 — ABNORMAL HIGH

## 2011-09-21 LAB — DIFFERENTIAL
Eosinophils Relative: 5
Lymphocytes Relative: 17
Lymphs Abs: 1.4
Monocytes Absolute: 0.5

## 2011-09-21 LAB — VANCOMYCIN, TROUGH: Vancomycin Tr: 32.4

## 2011-09-21 LAB — TSH: TSH: 9.729 — ABNORMAL HIGH

## 2011-09-21 LAB — VANCOMYCIN, RANDOM: Vancomycin Rm: 26.6

## 2013-04-24 ENCOUNTER — Non-Acute Institutional Stay (SKILLED_NURSING_FACILITY): Payer: Medicaid Other | Admitting: Adult Health

## 2013-04-24 DIAGNOSIS — G8929 Other chronic pain: Secondary | ICD-10-CM

## 2013-04-24 DIAGNOSIS — R609 Edema, unspecified: Secondary | ICD-10-CM

## 2013-04-24 DIAGNOSIS — I10 Essential (primary) hypertension: Secondary | ICD-10-CM

## 2013-04-24 DIAGNOSIS — R569 Unspecified convulsions: Secondary | ICD-10-CM

## 2013-04-24 DIAGNOSIS — E039 Hypothyroidism, unspecified: Secondary | ICD-10-CM

## 2013-06-09 ENCOUNTER — Encounter (HOSPITAL_COMMUNITY): Payer: Self-pay | Admitting: Emergency Medicine

## 2013-06-09 ENCOUNTER — Inpatient Hospital Stay (HOSPITAL_COMMUNITY)
Admission: EM | Admit: 2013-06-09 | Discharge: 2013-06-11 | DRG: 638 | Disposition: A | Discharge status: Skilled Nursing Facility | Payer: Medicaid Other | Attending: Internal Medicine | Admitting: Internal Medicine

## 2013-06-09 DIAGNOSIS — I878 Other specified disorders of veins: Secondary | ICD-10-CM

## 2013-06-09 DIAGNOSIS — I471 Supraventricular tachycardia: Secondary | ICD-10-CM | POA: Diagnosis present

## 2013-06-09 DIAGNOSIS — E1101 Type 2 diabetes mellitus with hyperosmolarity with coma: Secondary | ICD-10-CM

## 2013-06-09 DIAGNOSIS — I872 Venous insufficiency (chronic) (peripheral): Secondary | ICD-10-CM | POA: Diagnosis present

## 2013-06-09 DIAGNOSIS — R4182 Altered mental status, unspecified: Secondary | ICD-10-CM | POA: Diagnosis present

## 2013-06-09 DIAGNOSIS — E039 Hypothyroidism, unspecified: Secondary | ICD-10-CM | POA: Diagnosis present

## 2013-06-09 DIAGNOSIS — E1100 Type 2 diabetes mellitus with hyperosmolarity without nonketotic hyperglycemic-hyperosmolar coma (NKHHC): Principal | ICD-10-CM | POA: Diagnosis present

## 2013-06-09 DIAGNOSIS — I10 Essential (primary) hypertension: Secondary | ICD-10-CM | POA: Diagnosis present

## 2013-06-09 DIAGNOSIS — G40909 Epilepsy, unspecified, not intractable, without status epilepticus: Secondary | ICD-10-CM | POA: Diagnosis present

## 2013-06-09 DIAGNOSIS — E1165 Type 2 diabetes mellitus with hyperglycemia: Secondary | ICD-10-CM | POA: Diagnosis present

## 2013-06-09 DIAGNOSIS — I498 Other specified cardiac arrhythmias: Secondary | ICD-10-CM | POA: Diagnosis present

## 2013-06-09 DIAGNOSIS — E870 Hyperosmolality and hypernatremia: Secondary | ICD-10-CM | POA: Diagnosis present

## 2013-06-09 DIAGNOSIS — E876 Hypokalemia: Secondary | ICD-10-CM | POA: Diagnosis present

## 2013-06-09 DIAGNOSIS — E1110 Type 2 diabetes mellitus with ketoacidosis without coma: Secondary | ICD-10-CM

## 2013-06-09 DIAGNOSIS — M8660 Other chronic osteomyelitis, unspecified site: Secondary | ICD-10-CM | POA: Diagnosis present

## 2013-06-09 HISTORY — DX: Essential (primary) hypertension: I10

## 2013-06-09 HISTORY — DX: Disorder of thyroid, unspecified: E07.9

## 2013-06-09 HISTORY — DX: Epilepsy, unspecified, not intractable, without status epilepticus: G40.909

## 2013-06-09 HISTORY — DX: Venous insufficiency (chronic) (peripheral): I87.2

## 2013-06-09 HISTORY — DX: Heart failure, unspecified: I50.9

## 2013-06-09 LAB — URINALYSIS, ROUTINE W REFLEX MICROSCOPIC
Hgb urine dipstick: NEGATIVE
Nitrite: NEGATIVE
Protein, ur: NEGATIVE mg/dL
Urobilinogen, UA: 0.2 mg/dL (ref 0.0–1.0)

## 2013-06-09 LAB — BASIC METABOLIC PANEL
BUN: 47 mg/dL — ABNORMAL HIGH (ref 6–23)
BUN: 37 mg/dL — ABNORMAL HIGH (ref 6–23)
CO2: 31 mEq/L (ref 19–32)
Chloride: 110 mEq/L (ref 96–112)
Chloride: 115 mEq/L — ABNORMAL HIGH (ref 96–112)
Chloride: 116 mEq/L — ABNORMAL HIGH (ref 96–112)
GFR calc Af Amer: >90 mL/min (ref >90)
GFR calc Af Amer: >90 mL/min (ref >90)
GFR calc Af Amer: >90 mL/min (ref >90)
GFR calc non Af Amer: 89 mL/min — ABNORMAL LOW (ref >90)
GFR calc non Af Amer: 89 mL/min — ABNORMAL LOW (ref >90)
Glucose, Bld: 855 mg/dL (ref 70–99)
Glucose, Bld: 715 mg/dL (ref 70–99)
Potassium: 3.7 mEq/L (ref 3.5–5.1)
Potassium: 3.5 mEq/L (ref 3.5–5.1)
Potassium: 3.1 mEq/L — ABNORMAL LOW (ref 3.5–5.1)
Potassium: 3 mEq/L — ABNORMAL LOW (ref 3.5–5.1)
Sodium: 162 mEq/L (ref 135–145)
Sodium: 159 mEq/L — ABNORMAL HIGH (ref 135–145)
Sodium: 159 mEq/L — ABNORMAL HIGH (ref 135–145)

## 2013-06-09 LAB — CBC WITH DIFFERENTIAL/PLATELET
Basophils Absolute: 0 10*3/uL (ref 0.0–0.1)
Lymphocytes Relative: 15 % (ref 12–46)
Lymphs Abs: 1.8 10*3/uL (ref 0.7–4.0)
Neutro Abs: 9 10*3/uL — ABNORMAL HIGH (ref 1.7–7.7)
Platelets: 224 10*3/uL (ref 150–400)
RBC: 5.07 MIL/uL (ref 3.87–5.11)
RDW: 17.8 % — ABNORMAL HIGH (ref 11.5–15.5)
WBC: 11.7 10*3/uL — ABNORMAL HIGH (ref 4.0–10.5)

## 2013-06-09 LAB — URINE MICROSCOPIC-ADD ON

## 2013-06-09 LAB — AMMONIA: Ammonia: 15 umol/L (ref 11–60)

## 2013-06-09 LAB — COMPREHENSIVE METABOLIC PANEL
ALT: 8 U/L (ref 0–35)
AST: 26 U/L (ref 0–37)
Alkaline Phosphatase: 189 U/L — ABNORMAL HIGH (ref 39–117)
CO2: 23 mEq/L (ref 19–32)
Chloride: 103 mEq/L (ref 96–112)
GFR calc non Af Amer: 68 mL/min — ABNORMAL LOW (ref >90)
Glucose, Bld: 1068 mg/dL (ref 70–99)
Potassium: 4 mEq/L (ref 3.5–5.1)
Sodium: 153 mEq/L — ABNORMAL HIGH (ref 135–145)
Total Bilirubin: 0.3 mg/dL (ref 0.3–1.2)

## 2013-06-09 LAB — HEMOGLOBIN A1C
Hgb A1c MFr Bld: 13.2 % — ABNORMAL HIGH (ref <5.7)
Mean Plasma Glucose: 332 mg/dL — ABNORMAL HIGH (ref <117)

## 2013-06-09 LAB — GLUCOSE, CAPILLARY
Glucose-Capillary: 142 mg/dL — ABNORMAL HIGH (ref 70–99)
Glucose-Capillary: 161 mg/dL — ABNORMAL HIGH (ref 70–99)
Glucose-Capillary: 298 mg/dL — ABNORMAL HIGH (ref 70–99)
Glucose-Capillary: 521 mg/dL — ABNORMAL HIGH (ref 70–99)
Glucose-Capillary: >600 mg/dL (ref 70–99)
Glucose-Capillary: >600 mg/dL (ref 70–99)
Glucose-Capillary: >600 mg/dL (ref 70–99)

## 2013-06-09 LAB — OSMOLALITY: Osmolality: 402 mOsm/kg — ABNORMAL HIGH (ref 275–300)

## 2013-06-09 LAB — BLOOD GAS, VENOUS
Acid-base deficit: 2 mmol/L (ref 0.0–2.0)
O2 Saturation: 81.7 %
Patient temperature: 98.6
pO2, Ven: 52.8 mmHg — ABNORMAL HIGH (ref 30.0–45.0)

## 2013-06-09 LAB — VALPROIC ACID LEVEL: Valproic Acid Lvl: 79.9 ug/mL (ref 50.0–100.0)

## 2013-06-09 LAB — CBC
HCT: 50.1 % — ABNORMAL HIGH (ref 36.0–46.0)
MCHC: 30.7 g/dL (ref 30.0–36.0)
Platelets: 199 10*3/uL (ref 150–400)
RDW: 17.4 % — ABNORMAL HIGH (ref 11.5–15.5)
WBC: 12.3 10*3/uL — ABNORMAL HIGH (ref 4.0–10.5)

## 2013-06-09 LAB — MRSA PCR SCREENING: MRSA by PCR: NEGATIVE

## 2013-06-09 LAB — TROPONIN I: Troponin I: <0.3 ng/mL (ref <0.30)

## 2013-06-09 MED ORDER — SODIUM CHLORIDE 0.9 % IV SOLN
1000.0000 mL | Freq: Once | INTRAVENOUS | Status: AC
  Administered 2013-06-09: 1000 mL via INTRAVENOUS

## 2013-06-09 MED ORDER — ENOXAPARIN SODIUM 40 MG/0.4ML ~~LOC~~ SOLN
40.0000 mg | SUBCUTANEOUS | Status: DC
  Administered 2013-06-09 – 2013-06-11 (×3): 40 mg via SUBCUTANEOUS
  Filled 2013-06-09 (×3): qty 0.4

## 2013-06-09 MED ORDER — POTASSIUM CHLORIDE 10 MEQ/100ML IV SOLN
10.0000 meq | INTRAVENOUS | Status: AC
  Administered 2013-06-09 (×2): 10 meq via INTRAVENOUS
  Filled 2013-06-09 (×3): qty 100

## 2013-06-09 MED ORDER — VITAMIN D 1000 UNITS PO TABS
2000.0000 [IU] | ORAL_TABLET | Freq: Every day | ORAL | Status: DC
  Administered 2013-06-09 – 2013-06-11 (×3): 2000 [IU] via ORAL
  Filled 2013-06-09 (×3): qty 2

## 2013-06-09 MED ORDER — SODIUM CHLORIDE 0.45 % IV SOLN
INTRAVENOUS | Status: DC
  Administered 2013-06-09: 13:00:00 via INTRAVENOUS

## 2013-06-09 MED ORDER — SODIUM CHLORIDE 0.9 % IV SOLN: INTRAVENOUS | Status: AC

## 2013-06-09 MED ORDER — INSULIN ASPART 100 UNIT/ML ~~LOC~~ SOLN: 0.0000 [IU] | Freq: Every day | SUBCUTANEOUS | Status: DC

## 2013-06-09 MED ORDER — DIVALPROEX SODIUM 500 MG PO DR TAB
500.0000 mg | DELAYED_RELEASE_TABLET | Freq: Every day | ORAL | Status: DC
  Administered 2013-06-09 – 2013-06-10 (×2): 500 mg via ORAL
  Filled 2013-06-09 (×3): qty 1

## 2013-06-09 MED ORDER — INSULIN ASPART 100 UNIT/ML ~~LOC~~ SOLN
0.0000 [IU] | Freq: Three times a day (TID) | SUBCUTANEOUS | Status: DC
  Administered 2013-06-10 (×2): 8 [IU] via SUBCUTANEOUS

## 2013-06-09 MED ORDER — INSULIN ASPART 100 UNIT/ML ~~LOC~~ SOLN: 0.0000 [IU] | Freq: Three times a day (TID) | SUBCUTANEOUS | Status: DC

## 2013-06-09 MED ORDER — SODIUM CHLORIDE 0.9 % IV SOLN
INTRAVENOUS | Status: DC
  Administered 2013-06-09 – 2013-06-10 (×2): via INTRAVENOUS

## 2013-06-09 MED ORDER — SODIUM CHLORIDE 0.9 % IV SOLN
1000.0000 mL | INTRAVENOUS | Status: DC
  Administered 2013-06-09: 1000 mL via INTRAVENOUS

## 2013-06-09 MED ORDER — POTASSIUM CHLORIDE CRYS ER 20 MEQ PO TBCR
40.0000 meq | EXTENDED_RELEASE_TABLET | Freq: Once | ORAL | Status: AC
  Administered 2013-06-09: 40 meq via ORAL
  Filled 2013-06-09: qty 2

## 2013-06-09 MED ORDER — METOPROLOL TARTRATE 1 MG/ML IV SOLN: 2.5000 mg | INTRAVENOUS | Status: DC | PRN

## 2013-06-09 MED ORDER — INSULIN GLARGINE 100 UNIT/ML ~~LOC~~ SOLN
10.0000 [IU] | Freq: Every day | SUBCUTANEOUS | Status: DC
  Administered 2013-06-09: 10 [IU] via SUBCUTANEOUS
  Filled 2013-06-09 (×2): qty 0.1

## 2013-06-09 MED ORDER — SODIUM CHLORIDE 0.9 % IJ SOLN
3.0000 mL | Freq: Two times a day (BID) | INTRAMUSCULAR | Status: DC
  Administered 2013-06-09: 3 mL via INTRAVENOUS

## 2013-06-09 MED ORDER — METOPROLOL TARTRATE 25 MG PO TABS
25.0000 mg | ORAL_TABLET | Freq: Every day | ORAL | Status: DC
Start: 2013-06-09 — End: 2013-06-11
  Administered 2013-06-09 – 2013-06-11 (×3): 25 mg via ORAL
  Filled 2013-06-09 (×3): qty 1

## 2013-06-09 MED ORDER — DEXTROSE-NACL 5-0.45 % IV SOLN
INTRAVENOUS | Status: DC
  Administered 2013-06-09: 19:00:00 via INTRAVENOUS

## 2013-06-09 MED ORDER — SACCHAROMYCES BOULARDII 250 MG PO CAPS
250.0000 mg | ORAL_CAPSULE | Freq: Every day | ORAL | Status: DC | PRN
Start: 2013-06-09 — End: 2013-06-11
  Filled 2013-06-09: qty 1

## 2013-06-09 MED ORDER — NYSTATIN 100000 UNIT/GM EX POWD
Freq: Once | CUTANEOUS | Status: AC
  Administered 2013-06-09: 05:00:00 via TOPICAL
  Filled 2013-06-09: qty 15

## 2013-06-09 MED ORDER — SODIUM CHLORIDE 0.9 % IV SOLN
INTRAVENOUS | Status: DC
  Administered 2013-06-09: 11:00:00 via INTRAVENOUS

## 2013-06-09 MED ORDER — INSULIN ASPART 100 UNIT/ML ~~LOC~~ SOLN
10.0000 [IU] | Freq: Once | SUBCUTANEOUS | Status: AC
  Administered 2013-06-09: 10 [IU] via SUBCUTANEOUS

## 2013-06-09 MED ORDER — LEVOTHYROXINE SODIUM 200 MCG PO TABS
200.0000 ug | ORAL_TABLET | Freq: Every day | ORAL | Status: DC
  Administered 2013-06-09 – 2013-06-11 (×3): 200 ug via ORAL
  Filled 2013-06-09 (×4): qty 1

## 2013-06-09 MED ORDER — BIOTENE DRY MOUTH MT LIQD
15.0000 mL | Freq: Two times a day (BID) | OROMUCOSAL | Status: DC
  Administered 2013-06-09 – 2013-06-11 (×5): 15 mL via OROMUCOSAL

## 2013-06-09 MED ORDER — ACETAMINOPHEN 325 MG PO TABS
650.0000 mg | ORAL_TABLET | Freq: Four times a day (QID) | ORAL | Status: DC | PRN
  Administered 2013-06-10: 650 mg via ORAL
  Filled 2013-06-09: qty 2

## 2013-06-09 MED ORDER — PNEUMOCOCCAL VAC POLYVALENT 25 MCG/0.5ML IJ INJ
0.5000 mL | INJECTION | INTRAMUSCULAR | Status: AC
  Administered 2013-06-10: 0.5 mL via INTRAMUSCULAR
  Filled 2013-06-09 (×2): qty 0.5

## 2013-06-09 MED ORDER — INSULIN ASPART 100 UNIT/ML ~~LOC~~ SOLN: 0.0000 [IU] | SUBCUTANEOUS | Status: DC

## 2013-06-09 MED ORDER — SODIUM CHLORIDE 0.9 % IV SOLN
INTRAVENOUS | Status: DC
  Administered 2013-06-09: 08:00:00 via INTRAVENOUS

## 2013-06-09 MED ORDER — DEXTROSE 50 % IV SOLN: 25.0000 mL | INTRAVENOUS | Status: DC | PRN

## 2013-06-09 MED ORDER — SODIUM CHLORIDE 0.9 % IV SOLN
INTRAVENOUS | Status: DC
  Administered 2013-06-09: 1.8 [IU]/h via INTRAVENOUS
  Administered 2013-06-09: 5.4 [IU]/h via INTRAVENOUS
  Filled 2013-06-09: qty 1

## 2013-06-09 MED ORDER — DIVALPROEX SODIUM 250 MG PO DR TAB
250.0000 mg | DELAYED_RELEASE_TABLET | Freq: Every day | ORAL | Status: DC
  Administered 2013-06-09 – 2013-06-11 (×3): 250 mg via ORAL
  Filled 2013-06-09 (×3): qty 1

## 2013-06-09 NOTE — H&P (Addendum)
[facility] History and Physical  [name] JYN:[id number] DOB: 1951/04/09    PCP:   Posey Rea.  She is from the nursing home.  Chief Complaint: sent here for confusion.  HPI: [name] is an 62 y.o. female with hx of hypothyroidism, HTN, chronic and severe venous stasis ulcer, hx of epilepsy, but no known hx of DM, sent here as she was having some confusion.  She was able to tell me she is at [facility], and that she was sent in because of her confusion.  She said she was on Lasix before, and it was taken off, but then she had it resumed a few months ago.  Evaluation in the ER showed that she is Hyperosmolar Nonketotic Hyperglycemia, with BS of 1068, Bicarb 23, and AG of 23.  She has hypernatremia with Na of 153, even in the setting of severe hyperglycemia.  She has slight elevated WBC and Hb,which I suspect is hemoconcentration.  She was given IVF and hospitalist was asked to admit her for hyperosmolar nonketotic hyperglycemia.  Rewiew of Systems:  Constitutional: Negative for malaise, fever and chills. No significant weight loss or weight gain Eyes: Negative for eye pain, redness and discharge, diplopia, visual changes, or flashes of light. ENMT: Negative for ear pain, hoarseness, nasal congestion, sinus pressure and sore throat. No headaches; tinnitus, drooling, or problem swallowing. Cardiovascular: Negative for chest pain, palpitations, diaphoresis, dyspnea and peripheral edema. ; No orthopnea, PND Respiratory: Negative for cough, hemoptysis, wheezing and stridor. No pleuritic chestpain. Gastrointestinal: Negative for nausea, vomiting, diarrhea, constipation, abdominal pain, melena, blood in stool, hematemesis, jaundice and rectal bleeding.    Genitourinary: Negative for frequency, dysuria, incontinence,flank pain and hematuria; Musculoskeletal: Negative for back pain and neck pain. Negative for swelling and trauma.;  Skin: . Negative for pruritus, rash, abrasions, bruising and  skin lesion.; ulcerations Neuro: Negative for headache, lightheadedness and neck stiffness. Negative for weakness, altered level of consciousness , altered mental status, extremity weakness, burning feet, involuntary movement, seizure and syncope.  Psych: negative for anxiety, depression, insomnia, tearfulness, panic attacks, hallucinations, paranoia, suicidal or homicidal ideation    Past Medical History  Diagnosis Date  . Hypertension   . Thyroid disease     Hypothyroidism  . CHF (congestive heart failure)   . Epilepsy   . Venous insufficiency     History reviewed. No pertinent past surgical history.  Medications:  HOME MEDS: Prior to Admission medications   Medication Sig Start Date End Date Taking? Authorizing Provider  acetaminophen (TYLENOL) 325 MG tablet Take 650 mg by mouth every 6 (six) hours as needed for pain (Pain).   Yes Historical Provider, MD  baclofen (LIORESAL) 10 MG tablet Take 10 mg by mouth 3 (three) times daily.   Yes Historical Provider, MD  calcium carbonate (TUMS - DOSED IN MG ELEMENTAL CALCIUM) 500 MG chewable tablet Chew 1 tablet by mouth daily as needed for heartburn (Pt request).   Yes Historical Provider, MD  Cholecalciferol (VITAMIN D3) 2000 UNITS capsule Take 2,000 Units by mouth daily.   Yes Historical Provider, MD  divalproex (DEPAKOTE) 250 MG DR tablet Take 250-500 mg by mouth daily. Take 250mg  in am   And 500mg  in pm   Yes Historical Provider, MD  furosemide (LASIX) 20 MG tablet Take 20 mg by mouth daily. Takes in the evening   Yes Historical Provider, MD  levothyroxine (SYNTHROID, LEVOTHROID) 200 MCG tablet Take 200 mcg by mouth daily.   Yes Historical Provider, MD  metoprolol  tartrate (LOPRESSOR) 25 MG tablet Take 25 mg by mouth daily.   Yes Historical Provider, MD  promethazine (PHENERGAN) 25 MG tablet Take 25 mg by mouth every 4 (four) hours as needed for nausea (nausea).   Yes Historical Provider, MD  saccharomyces boulardii (FLORASTOR) 250 MG  capsule Take 250 mg by mouth daily as needed (diarrhea).   Yes Historical Provider, MD  traMADol-acetaminophen (ULTRACET) 37.5-325 MG per tablet Take 1 tablet by mouth every 6 (six) hours as needed for pain (pain).   Yes Historical Provider, MD     Allergies:  Allergies  Allergen Reactions  . Codeine   . Morphine And Related   . Penicillins     Social History:   has no tobacco, alcohol, and drug history on file.  Family History: No family history on file.   Physical Exam: Filed Vitals:   06/09/13 0036 06/09/13 0102  BP: 161/63   Pulse: 98   Temp: 96.8 F (36 C)   TempSrc: Oral   Resp: 22   SpO2: 89% 96%   Blood pressure 161/63, pulse 98, temperature 96.8 F (36 C), temperature source Oral, resp. rate 22, SpO2 96.00%.  GEN:  Pleasant  patient lying in the stretcher in no acute distress; cooperative with exam. PSYCH:  alert and oriented x4; does not appear anxious or depressed; affect is appropriate. HEENT: Mucous membranes pink and anicteric; PERRLA; EOM intact; no cervical lymphadenopathy nor thyromegaly or carotid bruit; no JVD; There were no stridor. Neck is very supple. Breasts:: Not examined CHEST WALL: No tenderness CHEST: Normal respiration, clear to auscultation bilaterally.  HEART: Regular rate and rhythm.  There are no murmur, rub, or gallops.   BACK: No kyphosis or scoliosis; no CVA tenderness ABDOMEN: soft and non-tender; no masses, no organomegaly, normal abdominal bowel sounds; no pannus; no intertriginous candida. There is no rebound and no distention. Rectal Exam: Not done EXTREMITIES: No bone or joint deformity; age-appropriate arthropathy of the hands and knees; 4+ edema with chronic venous stasis changes.   no ulcerations.  There is no calf tenderness. Genitalia: not examined PULSES: 2+ and symmetric SKIN: Normal hydration no rash or ulceration CNS: Cranial nerves 2-12 grossly intact no focal lateralizing neurologic deficit.  Speech is fluent; uvula  elevated with phonation, facial symmetry and tongue midline. DTR are normal bilaterally, cerebella exam is intact, barbinski is negative and strengths are equaled bilaterally.  No sensory loss.   Labs on Admission:  Basic Metabolic Panel:  Recent Labs Lab 06/09/13 0135  NA 153*  K 4.0  CL 103  CO2 23  GLUCOSE 1068*  BUN 50*  CREATININE 0.89  CALCIUM 9.8   Liver Function Tests:  Recent Labs Lab 06/09/13 0135  AST 26  ALT 8  ALKPHOS 189*  BILITOT 0.3  PROT 7.3  ALBUMIN 2.7*   No results found for this basename: LIPASE, AMYLASE,  in the last 168 hours No results found for this basename: AMMONIA,  in the last 168 hours CBC:  Recent Labs Lab 06/09/13 0135  WBC 11.7*  NEUTROABS 9.0*  HGB 15.2*  HCT 51.0*  MCV 100.6*  PLT 224   Cardiac Enzymes:  Recent Labs Lab 06/09/13 0135  CKTOTAL 99  TROPONINI <0.30    CBG:  Recent Labs Lab 06/09/13 0047  GLUCAP >600*     Radiological Exams on Admission: No results found.  Assessment/Plan  Hyperosmolar Nonketotic Hyperglycemia Severe volume depletion. Confusion from above. Hypernatremia Hypothyroidism Chronic stasis venous changes.  PLAN:  Her BS has  decreased to 600's with just IVF alone.  Will continue to give 250cc/Hr for 8 hours, then please readjust rate.  I will give her SSI with sensitive scale.  Will d/c Lasix for now.  For the Hypernatremia, the fluid of choice for now is NS.   She also has hypothyroidism and will continue with her supplement.  Check TSH.  Note her troponin is negative, and her EKG showed no acute MI, but T waves are inverted.  Will need cycling of her troponin, as ACS can present like this as well.  Also, she is on Depakote, so will check NH3 level, and check Valproic acid level.   She is stable, full code, and will be admitted to telemetry under [facility] service.  Thank you for allowing me to partake in the care of this nice patient.  Other plans as per orders.  Code Status: FULL  Unk Lightning, MD. [facility] Pager [phone] 7pm to 7am.  06/09/2013, 3:21 AM

## 2013-06-09 NOTE — ED Provider Notes (Signed)
History    CSN: [id number] Arrival date & time 06/09/13  0034  First MD Initiated Contact with Patient 06/09/13 0104     Chief Complaint  Patient presents with  . Hyperglycemia    `   (Consider location/radiation/quality/duration/timing/severity/associated sxs/prior Treatment) HPI 62 year old female presents to the Emergency department via EMS from her nursing facility with complaint of altered mental status.  Patient reports to me she was sent in by her orthopedic doctor for more testing.  Patient is oriented to self, and knows she's in the hospital.  She denies history of diabetes or high blood sugar in the past.  Patient seems confused with any further questioning.  Per nursing home notes, patient has had  Confusion pale, and cool to touch.  CBG noted to be above 500.  No prior history of diabetes or hyperglycemia.  No new medications noted   Past Medical History  Diagnosis Date  . Hypertension   . Thyroid disease     Hypothyroidism  . CHF (congestive heart failure)   . Epilepsy   . Venous insufficiency    History reviewed. No pertinent past surgical history. No family history on file. History  Substance Use Topics  . Smoking status: Not on file  . Smokeless tobacco: Not on file  . Alcohol Use: Not on file   OB History   Grav Para Term Preterm Abortions TAB SAB Ect Mult Living                 Review of Systems  Unable to perform ROS: Mental status change    Allergies  Codeine; Morphine and related; and Penicillins  Home Medications   Current Outpatient Rx  Name  Route  Sig  Dispense  Refill  . acetaminophen (TYLENOL) 325 MG tablet   Oral   Take 650 mg by mouth every 6 (six) hours as needed for pain (Pain).         . baclofen (LIORESAL) 10 MG tablet   Oral   Take 10 mg by mouth 3 (three) times daily.         . calcium carbonate (TUMS - DOSED IN MG ELEMENTAL CALCIUM) 500 MG chewable tablet   Oral   Chew 1 tablet by mouth daily as needed for heartburn  (Pt request).         . Cholecalciferol (VITAMIN D3) 2000 UNITS capsule   Oral   Take 2,000 Units by mouth daily.         . divalproex (DEPAKOTE) 250 MG DR tablet   Oral   Take 250-500 mg by mouth daily. Take 250mg  in am   And 500mg  in pm         . furosemide (LASIX) 20 MG tablet   Oral   Take 20 mg by mouth daily. Takes in the evening         . levothyroxine (SYNTHROID, LEVOTHROID) 200 MCG tablet   Oral   Take 200 mcg by mouth daily.         . metoprolol tartrate (LOPRESSOR) 25 MG tablet   Oral   Take 25 mg by mouth daily.         . promethazine (PHENERGAN) 25 MG tablet   Oral   Take 25 mg by mouth every 4 (four) hours as needed for nausea (nausea).         Marland Kitchen saccharomyces boulardii (FLORASTOR) 250 MG capsule   Oral   Take 250 mg by mouth daily as needed (diarrhea).         Marland Kitchen  traMADol-acetaminophen (ULTRACET) 37.5-325 MG per tablet   Oral   Take 1 tablet by mouth every 6 (six) hours as needed for pain (pain).          BP 161/63  Pulse 98  Temp(Src) 96.8 F (36 C) (Oral)  Resp 22  SpO2 96% Physical Exam  Nursing note and vitals reviewed. Constitutional: She appears well-developed and well-nourished. No distress.  HENT:  Head: Normocephalic and atraumatic.  Right Ear: External ear normal.  Left Ear: External ear normal.  Nose: Nose normal.  Mouth/Throat: Oropharynx is clear and moist.  Eyes: Conjunctivae and EOM are normal. Pupils are equal, round, and reactive to light.  Neck: Normal range of motion. Neck supple. No JVD present. No tracheal deviation present. No thyromegaly present.  Cardiovascular: Normal rate, regular rhythm, normal heart sounds and intact distal pulses.  Exam reveals no gallop and no friction rub.   No murmur heard. Pulmonary/Chest: Effort normal and breath sounds normal. No stridor. No respiratory distress. She has no wheezes. She has no rales. She exhibits no tenderness.  Abdominal: Soft. Bowel sounds are normal. She  exhibits no distension and no mass. There is no tenderness. There is no rebound and no guarding.  Genitourinary:  Significant rash noted to the genital area, consistent with yeast  Musculoskeletal: She exhibits edema ( edema of lower legs, consistent with chronic venous stasis and CHF changes). She exhibits no tenderness.  Lymphadenopathy:    She has no cervical adenopathy.  Neurological: She is alert. She exhibits normal muscle tone. Coordination normal.  Tremor at rest noted  Skin: Skin is warm and dry. No rash noted. No erythema. No pallor.  Psychiatric: Her behavior is normal. Judgment and thought content normal.  confused, but will answer questions    ED Course  Procedures (including critical care time)  CRITICAL CARE Performed by: Olivia Mackie Total critical care time: 30 min Critical care time was exclusive of separately billable procedures and treating other patients. Critical care was necessary to treat or prevent imminent or life-threatening deterioration. Critical care was time spent personally by me on the following activities: development of treatment plan with patient and/or surrogate as well as nursing, discussions with consultants, evaluation of patient's response to treatment, examination of patient, obtaining history from patient or surrogate, ordering and performing treatments and interventions, ordering and review of laboratory studies, ordering and review of radiographic studies, pulse oximetry and re-evaluation of patient's condition.  Labs Reviewed  GLUCOSE, CAPILLARY - Abnormal; Notable for the following:    Glucose-Capillary >600 (*)    All other components within normal limits  CBC WITH DIFFERENTIAL - Abnormal; Notable for the following:    WBC 11.7 (*)    Hemoglobin 15.2 (*)    HCT 51.0 (*)    MCV 100.6 (*)    MCHC 29.8 (*)    RDW 17.8 (*)    Neutro Abs 9.0 (*)    All other components within normal limits  COMPREHENSIVE METABOLIC PANEL - Abnormal;  Notable for the following:    Sodium 153 (*)    Glucose, Bld 1068 (*)    BUN 50 (*)    Albumin 2.7 (*)    Alkaline Phosphatase 189 (*)    GFR calc non Af Amer 68 (*)    GFR calc Af Amer 78 (*)    All other components within normal limits  BLOOD GAS, VENOUS - Abnormal; Notable for the following:    pH, Ven 7.304 (*)    pCO2, Ven 51.4 (*)  pO2, Ven 52.8 (*)    Bicarbonate 24.7 (*)    All other components within normal limits  URINALYSIS, ROUTINE W REFLEX MICROSCOPIC - Abnormal; Notable for the following:    Specific Gravity, Urine 1.033 (*)    Glucose, UA >1000 (*)    Ketones, ur 40 (*)    Leukocytes, UA SMALL (*)    All other components within normal limits  URINE MICROSCOPIC-ADD ON  OSMOLALITY  TROPONIN I  CK    Date: 06/09/2013  Rate: 100  Rhythm: sinus tachycardia  QRS Axis: normal  Intervals: normal  ST/T Wave abnormalities: nonspecific ST/T changes  Conduction Disutrbances:none  Narrative Interpretation:   Old EKG Reviewed: changes noted   No results found. 1. Diabetic hyperosmolar non-ketotic state   2. Altered mental status   3. Hypernatremia   4. Hyperosmolarity syndrome   5. Hypothyroidism     MDM   62 year old female found to have diabetic hyperosmolar, nonketotic state, altered mental status, hypernatremia.  Patient is not comatose, however but is confused.  Patient has received IV fluids here.  Discuss with hospitalist for admission and further workup.    Olivia Mackie, MD 06/09/13 [phone]

## 2013-06-09 NOTE — ED Notes (Signed)
Pt denies hx of diabetes, paperwork from [facility] confirms no hx of diabetes. Unknown reason for CBG monitoring. Nurse from [facility] told EMS their CBG monitor read HI.

## 2013-06-09 NOTE — Progress Notes (Addendum)
[facility] PROGRESS NOTE  [name]:[id number] DOB: 18-Nov-1951 DOA: 06/09/2013 PCP: Terald Sleeper, MD  Brief narrative: [name] is an 62 y.o. female with a PMH of hypothyroidism, hypertension, chronic venous stasis ulcers, epilepsy but no prior history of diabetes who was admitted 06/09/2013 with confusion, found to be in a hyperosmolar nonketotic state with a blood glucose of 1068, a bicarbonate level of 23, and an anion gap of 23.  Assessment/Plan: Principal Problem:   Hyperosmolarity syndrome / Newly diagnosed type II DM -Serum osmolality 402. Blood glucose remains markedly elevated. -Admitted and given aggressive IV fluids with minimal improvement of her blood glucose. -Placed on insulin sensitive sliding scale. We'll switch this to an insulin drip per glucose stabilizer protocol. -Check hemoglobin A1c. Diabetes coordinator and dietitian consultation is requested. Active Problems:   Supraventricular tachycardia -Continue beta blocker.   Hypothyroidism -Followup TSH. Continue home dose of Synthroid for now.   Lower extremity venous stasis with chronic osteomyelitis -Wound care RN evaluation requested.   Hypernatremia -Continue aggressive IV fluids. At this point, normal saline is considered to be hypotonic given her elevated blood sodium.   Altered mental status -Likely from severe hyperglycemia and electrolyte derangements. -Ammonia level within normal limits.   Seizure disorder -Continue Depakote. Valproic acid levels were within normal limits.  Code Status: Full. Family Communication: Sister, Carney Bern, at bedside. Disposition Plan: SNF when stable.   Medical Consultants:  None.  Other Consultants:  Wound care nurse  Dietitian  Diabetes coordinator  Anti-infectives:  None.  HPI/Subjective: [name] is very confused. This is a change from her usual baseline per her family, who was at the bedside. She is without specific physical  complaints although the family thinks she is more thirsty than normal. Nurses report bouts of supraventricular tachycardia on telemetry.  Objective: Filed Vitals:   06/09/13 0102 06/09/13 0459 06/09/13 0629 06/09/13 0913  BP:  155/65 145/77 158/64  Pulse:  91 96 91  Temp:  98 F (36.7 C) 98.4 F (36.9 C) 97.4 F (36.3 C)  TempSrc:  Oral Oral Oral  Resp:  18 19 18   Height:   5\' 4"  (1.626 m)   Weight:   82 kg (180 lb 12.4 oz)   SpO2: 96% 93% 96% 96%   No intake or output data in the 24 hours ending 06/09/13 1014  Exam: Gen:  NAD Cardiovascular:  RRR, No M/R/G Respiratory:  Lungs CTAB Gastrointestinal:  Abdomen soft, NT/ND, + BS Extremities:  Markedly swollen with foot erythema on the right and an Unna boot in place.  Data Reviewed: Basic Metabolic Panel:  Recent Labs Lab 06/09/13 0135 06/09/13 0800  NA 153* 158*  K 4.0 3.7  CL 103 110  CO2 23 23  GLUCOSE 1068* 855*  BUN 50* 47*  CREATININE 0.89 0.79  CALCIUM 9.8 9.2   GFR Estimated Creatinine Clearance: 74.5 ml/min (by C-G formula based on Cr of 0.79). Liver Function Tests:  Recent Labs Lab 06/09/13 0135  AST 26  ALT 8  ALKPHOS 189*  BILITOT 0.3  PROT 7.3  ALBUMIN 2.7*    Recent Labs Lab 06/09/13 0420  AMMONIA 15   CBC:  Recent Labs Lab 06/09/13 0135 06/09/13 0800  WBC 11.7* 12.3*  NEUTROABS 9.0*  --   HGB 15.2* 15.4*  HCT 51.0* 50.1*  MCV 100.6* 97.9  PLT 224 199   Cardiac Enzymes:  Recent Labs Lab 06/09/13 0135 06/09/13 0800  CKTOTAL 99  --   TROPONINI <0.30 <0.30  CBG:  Recent Labs Lab 06/09/13 0047 06/09/13 0810 06/09/13 1003  GLUCAP >600* >600* >600*     Procedures and Diagnostic Studies: No results found.  Scheduled Meds: . sodium chloride   Intravenous STAT  . antiseptic oral rinse  15 mL Mouth Rinse BID  . cholecalciferol  2,000 Units Oral Daily  . divalproex  250 mg Oral Daily  . divalproex  500 mg Oral QHS  . enoxaparin (LOVENOX) injection  40 mg  Subcutaneous Q24H  . levothyroxine  200 mcg Oral Daily  . metoprolol tartrate  25 mg Oral Daily  . [START ON 06/10/2013] pneumococcal 23 valent vaccine  0.5 mL Intramuscular Tomorrow-1000  . potassium chloride  10 mEq Intravenous Q1H  . sodium chloride  3 mL Intravenous Q12H   Continuous Infusions: . sodium chloride    . sodium chloride    . dextrose 5 % and 0.45% NaCl    . insulin (NOVOLIN-R) infusion      Time spent: 35 minutes.   LOS: 0 days   RAMA,CHRISTINA  [facility] Pager [phone].   *Please note that the hospitalists switch teams on Wednesdays. Please call the flow manager at [phone] if you are having difficulty reaching the hospitalist taking care of this patient as she can update you and provide the most up-to-date pager number of provider caring for the patient. If 8PM-8AM, please contact night-coverage at [email or web address], password [facility]  06/09/2013, 10:14 AM

## 2013-06-09 NOTE — Progress Notes (Signed)
UR COMPLETED  

## 2013-06-09 NOTE — Consult Note (Addendum)
WOC consult Note Reason for Consult: Consult requested for right leg.  Pt has chronic stasis ulcers and venous stasis changes to bilat legs.  Left leg is pink and has generalized edema, no open wounds at this time,  slight amt yellow drainage, no odor.  Brown areas of hemosiderin staining. Left foot fairly immobile and difficult to reposition, rotates sideways all the time. If leg begins to have increased drainage, then foam dressing can be applied to protect and absorb drainage. Wound type: Right posterior leg with several patchy areas of partial thickness skin loss to posterior leg.  Affected area is approx 10X5X.1cm. Open areas 80% red, 20% yellow.  Mod yellow drainage, no odor. slight amt yellow drainage, no odor.  Brown areas of hemosiderin staining. Left foot fairly immobile and difficult to reposition, rotates sideways all the time Dressing procedure/placement/frequency: continue present plan of care which is silver hydrofiber, abd pad, and kerlex and acewrap to provide light compression.  This is changed Q week. Pt has heel lift boots on bilat to reduce pressure.  Family at bedside to assess wounds and discuss plan of care.  Please re-consult if further assistance is needed.  Thank-you,  Cammie Mcgee MSN, RN, CWOCN, [place], CNS [phone]

## 2013-06-09 NOTE — Progress Notes (Signed)
Spoke to [facility] CMT, pt starting to have burst of SVT, highly irregular. Will give scheduled dose of Lopressor and call Dr. Robb Matar.

## 2013-06-09 NOTE — ED Notes (Signed)
ZOX:[id number]<AV> Expected date:06/09/13<BR> Expected time:12:14 AM<BR> Means of arrival:Ambulance<BR> Comments:<BR> Hyperglycemia/hypertension

## 2013-06-09 NOTE — Progress Notes (Signed)
Nutrition Education Note  Pt discussed during interdisciplinary rounds.   RD provided "Carbohydrate Counting for People with Diabetes" handout from the Academy of Nutrition and Dietetics. Discussed different food groups and their effects on blood sugar, emphasizing carbohydrate-containing foods. Provided list of carbohydrates and recommended serving sizes of common foods.  Discussed importance of controlled and consistent carbohydrate intake throughout the day. Provided examples of ways to balance meals/snacks and encouraged intake of high-fiber, whole grain complex carbohydrates. Teach back method used.  Expect good compliance. Family states the nursing home where pt is from will adjust pt's diet accordingly. Pt reports drinking only diet green tea and not liking bread or rice. Dietary recall does not seem excessive in carbohydrate intake.   Body mass index is 31.02 kg/(m^2). Pt meets criteria for class I obesity based on current BMI.  Current diet order is CHO medium, no intake recorded. Labs and medications reviewed. No further nutrition interventions warranted at this time. Pt's blood sugar > 400 mg/dL but improved from > 1610 mg/dL on admission. RD contact information provided. If additional nutrition issues arise, please re-consult RD.  Levon Hedger MS, RD, LDN [phone] Pager [phone] After Hours Pager

## 2013-06-09 NOTE — ED Notes (Signed)
RT called to run VBG at this time

## 2013-06-09 NOTE — ED Notes (Signed)
Per EMS: Pt oriented to person, place and situation. CBG above 500. Pt from [facility]. Caregivers reported periods of confusion, pale and cool to touch. Lung sounds clear. Normal bowel sounds.

## 2013-06-09 NOTE — ED Notes (Signed)
Dr Norlene Campbell notified of glucose 1068

## 2013-06-10 DIAGNOSIS — M8660 Other chronic osteomyelitis, unspecified site: Secondary | ICD-10-CM

## 2013-06-10 LAB — BASIC METABOLIC PANEL
CO2: 24 mEq/L (ref 19–32)
CO2: 27 mEq/L (ref 19–32)
Calcium: 7.8 mg/dL — ABNORMAL LOW (ref 8.4–10.5)
Chloride: 112 mEq/L (ref 96–112)
GFR calc Af Amer: >90 mL/min (ref >90)
GFR calc non Af Amer: >90 mL/min (ref >90)
Glucose, Bld: 376 mg/dL — ABNORMAL HIGH (ref 70–99)
Potassium: 3.4 mEq/L — ABNORMAL LOW (ref 3.5–5.1)
Sodium: 149 mEq/L — ABNORMAL HIGH (ref 135–145)
Sodium: 150 mEq/L — ABNORMAL HIGH (ref 135–145)

## 2013-06-10 LAB — GLUCOSE, CAPILLARY
Glucose-Capillary: 225 mg/dL — ABNORMAL HIGH (ref 70–99)
Glucose-Capillary: 251 mg/dL — ABNORMAL HIGH (ref 70–99)
Glucose-Capillary: 463 mg/dL — ABNORMAL HIGH (ref 70–99)
Glucose-Capillary: 181 mg/dL — ABNORMAL HIGH (ref 70–99)

## 2013-06-10 LAB — CBC
Hemoglobin: 13.4 g/dL (ref 12.0–15.0)
RBC: 4.5 MIL/uL (ref 3.87–5.11)

## 2013-06-10 MED ORDER — POTASSIUM CHLORIDE CRYS ER 20 MEQ PO TBCR
40.0000 meq | EXTENDED_RELEASE_TABLET | Freq: Once | ORAL | Status: AC
  Administered 2013-06-10: 40 meq via ORAL
  Filled 2013-06-10: qty 2

## 2013-06-10 MED ORDER — INSULIN ASPART 100 UNIT/ML ~~LOC~~ SOLN
0.0000 [IU] | Freq: Three times a day (TID) | SUBCUTANEOUS | Status: DC
  Administered 2013-06-10: 20 [IU] via SUBCUTANEOUS
  Administered 2013-06-11: 11 [IU] via SUBCUTANEOUS
  Administered 2013-06-11: 7 [IU] via SUBCUTANEOUS

## 2013-06-10 MED ORDER — SODIUM CHLORIDE 0.9 % IV SOLN
INTRAVENOUS | Status: DC
  Administered 2013-06-10 – 2013-06-11 (×2): via INTRAVENOUS

## 2013-06-10 MED ORDER — INSULIN ASPART 100 UNIT/ML ~~LOC~~ SOLN
3.0000 [IU] | Freq: Three times a day (TID) | SUBCUTANEOUS | Status: DC
  Administered 2013-06-10 – 2013-06-11 (×2): 3 [IU] via SUBCUTANEOUS

## 2013-06-10 MED ORDER — INSULIN GLARGINE 100 UNIT/ML ~~LOC~~ SOLN
15.0000 [IU] | Freq: Every day | SUBCUTANEOUS | Status: DC
  Administered 2013-06-10: 15 [IU] via SUBCUTANEOUS
  Filled 2013-06-10 (×2): qty 0.15

## 2013-06-10 MED ORDER — INSULIN ASPART 100 UNIT/ML ~~LOC~~ SOLN
0.0000 [IU] | Freq: Every day | SUBCUTANEOUS | Status: DC
  Administered 2013-06-10: 2 [IU] via SUBCUTANEOUS

## 2013-06-10 NOTE — Progress Notes (Signed)
Inpatient Diabetes Program Recommendations  AACE/ADA: New Consensus Statement on Inpatient Glycemic Control (2013)  Target Ranges:  Prepandial:   less than 140 mg/dL      Peak postprandial:   less than 180 mg/dL (1-2 hours)      Critically ill patients:  140 - 180 mg/dL   Reason for Visit: Hyperglycemia, ?Newly-diagnosed DM  62 year old female presents to the Emergency department via EMS from her nursing facility with complaint of altered mental status. Patient reports she was sent in by her orthopedic doctor for more testing. Patient is oriented to self, and knows she's in the hospital. She denies history of diabetes or high blood sugar in the past. Patient seems confused with any further questioning. Per nursing home notes, patient has had Confusion pale, and cool to touch. CBG noted to be above 500. No prior history of diabetes or hyperglycemia. No new medications noted. Admitted with Hyperosmolarity Syndrome with Newly-diagnosed Type 2 DM.  Started on [employer] and transitioned to basal/bolus insulin. Results for STASHIA, SIA (MRN [id number]) as of 06/10/2013 09:38  Ref. Range 06/09/2013 21:41 06/09/2013 22:43 06/09/2013 23:33 06/10/2013 04:12 06/10/2013 07:45  Glucose-Capillary Latest Range: 70-99 mg/dL 629 (H) 528 (H) 413 (H) 225 (H) 251 (H)  Results for ARIABELLA, BRIEN (MRN [id number]) as of 06/10/2013 09:38  Ref. Range 06/09/2013 15:37  Sodium Latest Range: 135-145 mEq/L 159 (H)  Potassium Latest Range: 3.5-5.1 mEq/L 3.0 (L)  Chloride Latest Range: 96-112 mEq/L 116 (H)  CO2 Latest Range: 19-32 mEq/L 31  BUN Latest Range: 6-23 mg/dL 33 (H)  Creatinine Latest Range: 0.50-1.10 mg/dL 5.36  Calcium Latest Range: 8.4-10.5 mg/dL 8.6  GFR calc non Af Amer Latest Range: >90 mL/min >90  GFR calc Af Amer Latest Range: >90 mL/min >90  Glucose Latest Range: 70-99 mg/dL 644 (H)  Results for RAELI, WIENS (MRN [id number]) as of 06/10/2013 09:38  Ref. Range 06/09/2013 08:00  Hemoglobin A1C Latest Range:  <5.7 % 13.2 (H)   Recommendations:  Add meal coverage insulin - Novolog 3 units tidwc if pt eats >50% meal. Will need BMET to further assess.  Discussed with Dr. Elisabeth Pigeon. Will f/u later today.  Ailene Ards, RD, LDN, CDE Inpatient Diabetes Coordinator [phone]

## 2013-06-10 NOTE — Progress Notes (Signed)
Clinical Social Work Department BRIEF PSYCHOSOCIAL ASSESSMENT 06/10/2013  Patient:  [name], [name]     Account Number:  [id number]     Admit date:  06/09/2013  Clinical Social Worker:  Hattie Perch  Date/Time:  06/10/2013 12:00 M  Referred by:  Physician  Date Referred:  06/10/2013 Referred for  SNF Placement   Other Referral:   Interview type:  Family Other interview type:    PSYCHOSOCIAL DATA Living Status:  FACILITY Admitted from facility:  [facility] Level of care:  Skilled Nursing Facility Primary support name:  Emmie Niemann Primary support relationship to patient:  SIBLING Degree of support available:   fair    CURRENT CONCERNS Current Concerns  Post-Acute Placement   Other Concerns:    SOCIAL WORK ASSESSMENT / PLAN CSW met with patient and family at bedside. they confirm that patient is a long term care resident at [facility] and will return there upon discharge.   Assessment/plan status:   Other assessment/ plan:   Information/referral to community resources:    PATIENT'S/FAMILY'S RESPONSE TO PLAN OF CARE: family agreeable to patient returning to [place] upon discharge.   Clinical Social Work Department BRIEF PSYCHOSOCIAL ASSESSMENT 06/10/2013  Patient:  [name], [name]     Account Number:  [id number]     Admit date:  06/09/2013  Clinical Social Worker:  Hattie Perch  Date/Time:  06/10/2013 12:00 M  Referred by:  Physician  Date Referred:  06/10/2013 Referred for  SNF Placement   Other Referral:   Interview type:  Family Other interview type:    PSYCHOSOCIAL DATA Living Status:  FACILITY Admitted from facility:  [facility] Level of care:  Skilled Nursing Facility Primary support name:  Emmie Niemann Primary support relationship to patient:  SIBLING Degree of support available:   fair    CURRENT CONCERNS Current Concerns  Post-Acute Placement   Other Concerns:    SOCIAL WORK ASSESSMENT /  PLAN CSW met with patient and family at bedside. they confirm that patient is a long term care resident at [facility] and will return there upon discharge.   Assessment/plan status:   Other assessment/ plan:   Information/referral to community resources:    PATIENT'S/FAMILY'S RESPONSE TO PLAN OF CARE: family agreeable to patient returning to [place] upon discharge.

## 2013-06-10 NOTE — Progress Notes (Signed)
Inpatient Diabetes Program Recommendations  AACE/ADA: New Consensus Statement on Inpatient Glycemic Control (2013)  Target Ranges:  Prepandial:   less than 140 mg/dL      Peak postprandial:   less than 180 mg/dL (1-2 hours)      Critically ill patients:  140 - 180 mg/dL   Reason for Visit: Hyperglycemia  Results for [name], [name] (MRN [id number]) as of 06/10/2013 15:11  Ref. Range 06/09/2013 08:00  Hemoglobin A1C Latest Range: <5.7 % 13.2 (H)  Results for [name], [name] (MRN [id number]) as of 06/10/2013 15:11  Ref. Range 06/10/2013 04:12 06/10/2013 07:45 06/10/2013 11:51  Glucose-Capillary Latest Range: 70-99 mg/dL 782 (H) 956 (H) 213 (H)    Inpatient Diabetes Program Recommendations Insulin - Basal: Increase Lantus to 18 units QHS Insulin - Meal Coverage: Add meal coverage insulin - Novolog 3 units tidwc if pt eats >50% meal HgbA1C: 13.2% - uncontrolled DM  Note: Will continue to follow.  Thank you. Ailene Ards, RD, LDN, CDE Inpatient Diabetes Coordinator [phone]

## 2013-06-10 NOTE — Progress Notes (Signed)
Patient ID: [name], female   DOB: March 21, 1951, 62 y.o.   MRN: [id number]  [facility] PROGRESS NOTE  DESA RECH NUU:[id number] DOB: Jan 24, 1951 DOA: 06/09/2013 PCP: Terald Sleeper, MD  Brief narrative:  [name] is an 62 y.o. female with a PMH of hypothyroidism, hypertension, chronic venous stasis ulcers, epilepsy but no prior history of diabetes who was admitted 06/09/2013 with confusion, found to be in a hyperosmolar nonketotic state with a blood glucose of 1068, a bicarbonate level of 23, and an anion gap of 23.   Assessment/Plan:  Principal Problem:  Hyperosmolarity syndrome / Newly diagnosed type II DM  -Serum osmolality 402. Blood glucose remains markedly elevated.  -Admitted and given aggressive IV fluids, CBG still elevated as well as sodium level  -Placed on SSI, resistant scale and will add 3 units for meal coverage  -hemoglobin A1c > 13. Diabetes coordinator and dietitian consultation is appreciated  -Continue Lantus but will increase the dose to 15 units QHS  Active Problems:  Supraventricular tachycardia  -Continue beta blocker. Reasonable HR control  Hypothyroidism  -Stable TSH. Continue home dose of Synthroid for now.  Lower extremity venous stasis with chronic osteomyelitis  -Wound care RN evaluation appreciated  Hypernatremia  -Continue aggressive IV fluids. At this point, normal saline is considered to be hypotonic given her elevated blood sodium.  -Sodium is trending down  -Repeat BMET today and again in AM Altered mental status  -Likely from severe hyperglycemia and electrolyte derangements. Slightly improved on exam this AM  -Ammonia level within normal limits.  Seizure disorder  -Continue Depakote. Valproic acid levels were within normal limits.  Hypokalemia -Will continue to supplement   Code Status: Full.  Family Communication: No family at bedside  Disposition Plan: SNF when stable.   Medical Consultants:  None. Other Consultants:   Wound care nurse  Dietitian  Diabetes coordinator Anti-infectives:  None.  HPI/Subjective: No events overnight.   Objective: Filed Vitals:   06/10/13 0431 06/10/13 0500 06/10/13 1000 06/10/13 1112  BP: 150/60  140/59 140/59  Pulse: 94  93 93  Temp: 98.4 F (36.9 C)  97.4 F (36.3 C)   TempSrc: Oral  Oral   Resp: 18  18   Height:      Weight:  82.3 kg (181 lb 7 oz)    SpO2: 98%  92%     Intake/Output Summary (Last 24 hours) at 06/10/13 1345 Last data filed at 06/10/13 0720  Gross per 24 hour  Intake 1535.42 ml  Output    850 ml  Net 685.42 ml    Exam:   General:  Pt is alert, follows some commands appropriately, not in acute distress  Cardiovascular: Regular rate and rhythm, S1/S2, no murmurs, no rubs, no gallops  Respiratory: Clear to auscultation bilaterally, no wheezing, decreased breath sounds at bases   Abdomen: Soft, non tender, non distended, bowel sounds present, no guarding  Neuro: Grossly nonfocal  Data Reviewed: Basic Metabolic Panel:  Recent Labs Lab 06/09/13 0800 06/09/13 1028 06/09/13 1330 06/09/13 1537 06/10/13 1010  NA 158* 162* 159* 159* 149*  K 3.7 3.5 3.1* 3.0* 3.4*  CL 110 116* 115* 116* 112  CO2 23 23 29 31 24   GLUCOSE 855* 715* 436* 279* 376*  BUN 47* 42* 37* 33* 20  CREATININE 0.79 0.73 0.73 0.69 0.61  CALCIUM 9.2 8.9 8.7 8.6 7.6*   Liver Function Tests:  Recent Labs Lab 06/09/13 0135  AST 26  ALT 8  ALKPHOS 189*  BILITOT 0.3  PROT 7.3  ALBUMIN 2.7*    Recent Labs Lab 06/09/13 0420  AMMONIA 15   CBC:  Recent Labs Lab 06/09/13 0135 06/09/13 0800 06/10/13 1010  WBC 11.7* 12.3* 11.9*  NEUTROABS 9.0*  --   --   HGB 15.2* 15.4* 13.4  HCT 51.0* 50.1* 43.2  MCV 100.6* 97.9 96.0  PLT 224 199 163   Cardiac Enzymes:  Recent Labs Lab 06/09/13 0135 06/09/13 0800 06/09/13 1330  CKTOTAL 99  --   --   TROPONINI <0.30 <0.30 <0.30   CBG:  Recent Labs Lab 06/09/13 2243 06/09/13 2333 06/10/13 0412  06/10/13 0745 06/10/13 1151  GLUCAP 134* 131* 225* 251* 274*    Recent Results (from the past 240 hour(s))  MRSA PCR SCREENING     Status: None   Collection Time    06/09/13  7:19 AM      Result Value Range Status   MRSA by PCR NEGATIVE  NEGATIVE Final   Comment:            The GeneXpert MRSA Assay (FDA     approved for NASAL specimens     only), is one component of a     comprehensive MRSA colonization     surveillance program. It is not     intended to diagnose MRSA     infection nor to guide or     monitor treatment for     MRSA infections.     Scheduled Meds: . antiseptic oral rinse  15 mL Mouth Rinse BID  . cholecalciferol  2,000 Units Oral Daily  . divalproex  250 mg Oral Daily  . divalproex  500 mg Oral QHS  . enoxaparin (LOVENOX) injection  40 mg Subcutaneous Q24H  . insulin aspart  0-15 Units Subcutaneous TID WC  . insulin aspart  0-5 Units Subcutaneous QHS  . insulin glargine  10 Units Subcutaneous QHS  . levothyroxine  200 mcg Oral Daily  . metoprolol tartrate  25 mg Oral Daily  . sodium chloride  3 mL Intravenous Q12H   Continuous Infusions: . sodium chloride Stopped (06/09/13 1845)   Debbora Presto, MD  [facility] Pager [phone]  If 7PM-7AM, please contact night-coverage [email or web address] Password [email or web address] 06/10/2013, 1:45 PM   LOS: 1 day

## 2013-06-11 DIAGNOSIS — G40909 Epilepsy, unspecified, not intractable, without status epilepticus: Secondary | ICD-10-CM

## 2013-06-11 DIAGNOSIS — E1310 Other specified diabetes mellitus with ketoacidosis without coma: Secondary | ICD-10-CM

## 2013-06-11 LAB — CBC
HCT: 39.7 % (ref 36.0–46.0)
Hemoglobin: 12.1 g/dL (ref 12.0–15.0)
MCH: 28.6 pg (ref 26.0–34.0)
RBC: 4.23 MIL/uL (ref 3.87–5.11)

## 2013-06-11 LAB — BASIC METABOLIC PANEL
GFR calc non Af Amer: >90 mL/min (ref >90)
Glucose, Bld: 281 mg/dL — ABNORMAL HIGH (ref 70–99)
Potassium: 3.5 mEq/L (ref 3.5–5.1)
Sodium: 145 mEq/L (ref 135–145)

## 2013-06-11 LAB — GLUCOSE, CAPILLARY: Glucose-Capillary: 248 mg/dL — ABNORMAL HIGH (ref 70–99)

## 2013-06-11 MED ORDER — INSULIN ASPART 100 UNIT/ML ~~LOC~~ SOLN: 3.0000 [IU] | Freq: Three times a day (TID) | SUBCUTANEOUS | Status: DC

## 2013-06-11 MED ORDER — INSULIN GLARGINE 100 UNIT/ML ~~LOC~~ SOLN: 18.0000 [IU] | Freq: Every day | SUBCUTANEOUS | Status: DC

## 2013-06-11 NOTE — Discharge Summary (Signed)
Physician Discharge Summary  [name] YNW:[id number] DOB: March 24, 1951 DOA: 06/09/2013  PCP: Terald Sleeper, MD  Admit date: 06/09/2013 Discharge date: 06/11/2013  Recommendations for Outpatient Follow-up:  1. Pt will need to follow up with PCP in 2-3 weeks post discharge 2. Please obtain BMP to evaluate electrolytes and kidney function 3. Please also check CBC to evaluate Hg and Hct levels   Discharge Diagnoses:  Principal Problem:   DM (diabetes mellitus) type 2, uncontrolled, with hyperosmolarity syndrome Active Problems:   Hypothyroidism   Lower extremity venous stasis   Hypernatremia   Altered mental status   Seizure disorder   Chronic osteomyelitis   SVT (supraventricular tachycardia)   Discharge Condition: Stable  Diet recommendation: Heart healthy diet discussed in details   History of present illness:  [name] is an 62 y.o. female with a PMH of hypothyroidism, hypertension, chronic venous stasis ulcers, epilepsy but no prior history of diabetes who was admitted 06/09/2013 with confusion, found to be in a hyperosmolar nonketotic state with a blood glucose of 1068, a bicarbonate level of 23, and an anion gap of 23.   Assessment/Plan:   Principal Problem:  Hyperosmolarity syndrome / Newly diagnosed type II DM  - Serum osmolality 402. Patient given aggressive IV fluids - CBGs in past 24 hours are 463, 181, 248 - Hemoglobin A1c > 13. Appreciate diabetic coordinator recommendations - Continue Lantus but will increase the dose to 18 units QHS. Added NovoLog 3 units with meals  Active Problems:  Supraventricular tachycardia  -Continue beta blocker. Reasonable HR control  Hypothyroidism  -Stable TSH. Continue home dose of Synthroid for now.  Lower extremity venous stasis with chronic osteomyelitis  -Wound care RN evaluation  of the right posterior leg with several patchy areas of partial thickness skin loss to posterior leg. Affected area is about 10X5X.1cm.  Open areas 80% red, 20% yellow. Mod yellow drainage, no odor. slight  yellow drainage, no odor. Left foot fairly immobile and difficult to reposition, rotates sideways all the time  Dressing: continue present plan of care which is silver hydrofiber, abd pad, and kerlex and acewrap to provide light compression. This is changed Q week. Pt has heel lift boots on bilat to reduce pressure. Hypernatremia  -Likely dehydration. Resolved with aggressive IV fluids Altered mental status  -Likely from severe hyperglycemia and electrolyte derangements. Mental status improving -Ammonia level within normal limits.  Seizure disorder  -Continue Depakote. Valproic acid levels were within normal limits.  Hypokalemia  - Supplemented and potassium is now within normal limits  Code Status: Full.  Family Communication: No family at bedside   Medical Consultants:  None. Other Consultants:  Wound care nurse  Dietitian  Diabetes coordinator Anti-infectives:  None.   Discharge Exam: Filed Vitals:   06/11/13 1003  BP: 152/63  Pulse: 90  Temp: 98.1 F (36.7 C)  Resp: 18   Filed Vitals:   06/10/13 2138 06/11/13 0201 06/11/13 0502 06/11/13 1003  BP: 142/59 141/60 148/54 152/63  Pulse: 89 90 90 90  Temp: 98.7 F (37.1 C) 98.7 F (37.1 C) 98.6 F (37 C) 98.1 F (36.7 C)  TempSrc: Oral Oral Oral Oral  Resp: 18 18 18 18   Height:      Weight:   82.2 kg (181 lb 3.5 oz)   SpO2: 96% 98% 91% 93%    General: Pt is alert, follows commands appropriately, not in acute distress Cardiovascular: Regular rate and rhythm, S1/S2 +, no murmurs, no rubs, no gallops Respiratory: Clear  to auscultation bilaterally, no wheezing, no crackles, no rhonchi Abdominal: Soft, non tender, non distended, bowel sounds +, no guarding Extremities: no edema, no cyanosis, pulses palpable bilaterally DP and PT Neuro: Grossly nonfocal  Discharge Instructions  Discharge Orders   Future Orders Complete By Expires     Call MD  for:  difficulty breathing, headache or visual disturbances  As directed     Call MD for:  persistant dizziness or light-headedness  As directed     Call MD for:  persistant nausea and vomiting  As directed     Call MD for:  severe uncontrolled pain  As directed     Diet - low sodium heart healthy  As directed     Discharge instructions  As directed     Comments:      Please note that the Lantus was increased to 18 units at bedtime for better glycemic control. Continue NovoLog 3 units with meals.    Increase activity slowly  As directed         Medication List    TAKE these medications       acetaminophen 325 MG tablet  Commonly known as:  TYLENOL  Take 650 mg by mouth every 6 (six) hours as needed for pain (Pain).     baclofen 10 MG tablet  Commonly known as:  LIORESAL  Take 10 mg by mouth 3 (three) times daily.     calcium carbonate 500 MG chewable tablet  Commonly known as:  TUMS - dosed in mg elemental calcium  Chew 1 tablet by mouth daily as needed for heartburn (Pt request).     divalproex 250 MG DR tablet  Commonly known as:  DEPAKOTE  Take 250-500 mg by mouth daily. Take 250mg  in am   And 500mg  in pm     furosemide 20 MG tablet  Commonly known as:  LASIX  Take 20 mg by mouth daily. Takes in the evening     insulin aspart 100 UNIT/ML injection  Commonly known as:  novoLOG  Inject 3 Units into the skin 3 (three) times daily with meals.     insulin glargine 100 UNIT/ML injection  Commonly known as:  LANTUS  Inject 0.18 mLs (18 Units total) into the skin at bedtime.     levothyroxine 200 MCG tablet  Commonly known as:  SYNTHROID, LEVOTHROID  Take 200 mcg by mouth daily.     metoprolol tartrate 25 MG tablet  Commonly known as:  LOPRESSOR  Take 25 mg by mouth daily.     promethazine 25 MG tablet  Commonly known as:  PHENERGAN  Take 25 mg by mouth every 4 (four) hours as needed for nausea (nausea).     saccharomyces boulardii 250 MG capsule  Commonly known  as:  FLORASTOR  Take 250 mg by mouth daily as needed (diarrhea).     traMADol-acetaminophen 37.5-325 MG per tablet  Commonly known as:  ULTRACET  Take 1 tablet by mouth every 6 (six) hours as needed for pain (pain).     Vitamin D3 2000 UNITS capsule  Take 2,000 Units by mouth daily.           Follow-up Information   Follow up with Terald Sleeper, MD In 2 weeks.   Contact information:   [address] [place] [address] [phone]        The results of significant diagnostics from this hospitalization (including imaging, microbiology, ancillary and laboratory) are listed below for reference.  Microbiology: Recent Results (from the past 240 hour(s))  MRSA PCR SCREENING     Status: None   Collection Time    06/09/13  7:19 AM      Result Value Range Status   MRSA by PCR NEGATIVE  NEGATIVE Final   Comment:            The GeneXpert MRSA Assay (FDA     approved for NASAL specimens     only), is one component of a     comprehensive MRSA colonization     surveillance program. It is not     intended to diagnose MRSA     infection nor to guide or     monitor treatment for     MRSA infections.     Labs: Basic Metabolic Panel:  Recent Labs Lab 06/09/13 1330 06/09/13 1537 06/10/13 1010 06/10/13 1733 06/11/13 0457  NA 159* 159* 149* 150* 145  K 3.1* 3.0* 3.4* 3.6 3.5  CL 115* 116* 112 112 114*  CO2 29 31 24 27 24   GLUCOSE 436* 279* 376* 296* 281*  BUN 37* 33* 20 17 14   CREATININE 0.73 0.69 0.61 0.59 0.55  CALCIUM 8.7 8.6 7.6* 7.8* 7.4*   Liver Function Tests:  Recent Labs Lab 06/09/13 0135  AST 26  ALT 8  ALKPHOS 189*  BILITOT 0.3  PROT 7.3  ALBUMIN 2.7*   No results found for this basename: LIPASE, AMYLASE,  in the last 168 hours  Recent Labs Lab 06/09/13 0420  AMMONIA 15   CBC:  Recent Labs Lab 06/09/13 0135 06/09/13 0800 06/10/13 1010 06/11/13 0457  WBC 11.7* 12.3* 11.9* 12.2*  NEUTROABS 9.0*  --   --   --   HGB 15.2*  15.4* 13.4 12.1  HCT 51.0* 50.1* 43.2 39.7  MCV 100.6* 97.9 96.0 93.9  PLT 224 199 163 145*   Cardiac Enzymes:  Recent Labs Lab 06/09/13 0135 06/09/13 0800 06/09/13 1330  CKTOTAL 99  --   --   TROPONINI <0.30 <0.30 <0.30   BNP: BNP (last 3 results) No results found for this basename: PROBNP,  in the last 8760 hours CBG:  Recent Labs Lab 06/10/13 0745 06/10/13 1151 06/10/13 1652 06/10/13 2152 06/11/13 0734  GLUCAP 251* 274* 463* 181* 248*     SIGNED: Time coordinating discharge: Over 30 minutes  Debbora Presto, MD  [facility] 06/11/2013, 10:52 AM Pager [phone]  If 7PM-7AM, please contact night-coverage [email or web address] Password [email or web address]

## 2013-06-11 NOTE — Progress Notes (Signed)
Attempted to call report to RN at [facility].  No one available to take report.

## 2013-06-11 NOTE — Progress Notes (Signed)
Patient cleared for discharge. Chart copied and placed in [facility]. ptar called for transport. Patient aware and agreeable for discharge to [facility].  Apolo Cutshaw C. Akire Rennert MSW, LCSW [phone]

## 2013-06-16 ENCOUNTER — Non-Acute Institutional Stay (SKILLED_NURSING_FACILITY): Payer: Medicaid Other | Admitting: Internal Medicine

## 2013-06-16 DIAGNOSIS — E039 Hypothyroidism, unspecified: Secondary | ICD-10-CM

## 2013-06-16 DIAGNOSIS — E1101 Type 2 diabetes mellitus with hyperosmolarity with coma: Secondary | ICD-10-CM

## 2013-06-16 DIAGNOSIS — E876 Hypokalemia: Secondary | ICD-10-CM

## 2013-06-23 ENCOUNTER — Non-Acute Institutional Stay (SKILLED_NURSING_FACILITY): Payer: Medicaid Other | Admitting: Internal Medicine

## 2013-06-23 DIAGNOSIS — IMO0002 Reserved for concepts with insufficient information to code with codable children: Secondary | ICD-10-CM

## 2013-06-23 NOTE — Progress Notes (Signed)
Patient ID: [name], female   DOB: May 23, 1951, 62 y.o.   MRN: [id number] Facility; [facility]. Chief complaint; hyperglycemia, followup right hand cellulitis. History; I saw [name] last week. She was recently admitted to hospital for 2 days at the end of June with type 2 diabetes, which was uncontrolled and she had a hyperosmolar state that. She came to [place] on insulin, which was new for her. As noted last week. There were prior to this, although she does not seem to have been some references to type 2 diabetes , prior to this, although she has never been on treatment, the whole situation complicated by her consistent refusals of blood work, etc., when I reviewed her last week. Her blood sugars were very poorly controlled, she had diffuse swelling and pain of her right hand, especially across the dorsal surface of her hand. I thought she had the cellulitis and put her on Rocephin, fortunately things seem to have completely resolved here and she is much better today. I was concerned we are going to have to send her to a surgeon do to the soft tissue involvement. Lab work from 7/7 showed a white count of 7.4, hemoglobin 11.6, platelet count 408, sodium 133, glucose 279, alkaline phosphatase 230with normal AST normal ALT, and an albumin of 2.6.  With regards to her diabetes. She is now up to Lantus 40 units [came here on 18 units], she is already on NovoLog 10 units a.c. Meals. Yesterday she seemed to very with CBGs in the high 100s to low 300s. Today, he this morning. She is already over 600.  Review of systems Respiratory no cough no shortness of breath. Cardiac no chest pain. GI no nausea, vomiting, diarrhea, or abdominal pain no diarrhe GU no dysuria, although nursing reports a strong odor. Musculoskeletal; right hand is considerably better and not painful.  Physical exam; afebrile, P-78 RR 18 Gen. she is not in any distress. Respiratory clear entry bilaterally work of breathing is  normal. Cardiac heart sounds are normal. No murmurs. Abdomen; there is no right upper quadrant tenderness. No tenderness is noted. No liver no spleen. Abdomen is somewhat distended, but bowel sounds are positive. No surgical scars. GU some suprapubic tenderness is noted, but no costovertebral angle tenderness Skin; she has severe venous stasis and lymphedema and her legs are wrapped. However, they're not open areas, and no signs of infection  Impression/plan Type 2 diabetes; I have increased her Lantus to 45 and started her on metformin 500 twice a day. No change to the Humalog 10 units a.c. meals. Cellulitis right hand; fortunately, this says completely and dramatically responded to Rocephin, even though we have no cultures. I am assuming that this was due to an IV in the hospital, although I have no concrete evidence of this. Elevated alkaline phosphatase this is mild for now. Followup.  Start her on enteric-coated aspirin 81. Will need followup lipid panel

## 2013-06-24 NOTE — Progress Notes (Addendum)
Patient ID: [name], female   DOB: 1951/11/14, 62 y.o.   MRN: [id number]           HISTORY & PHYSICAL  DATE:  06/16/2013  FACILITY: [facility]    LEVEL OF CARE:   SNF   CHIEF COMPLAINT:  Readmission to the facility, post stay at [facility], 06/09/2013 through 06/11/2013.    HISTORY OF PRESENT ILLNESS:   This is a 62 year-old woman who has been in the facility for the last five years.  Prior to this, she was at a close-by assisted living.  She was brought into the SNF largely due to severe chronic venous stasis, lymphedema, and venous stasis ulcerations.    In this hospitalization, she was sent to hospital  apparently with increasing confusion and altered LOC.  She was found to have a blood glucose of 1068 and in hyperosmolar nonketotic coma.  Her bicarb was 23 and IEF was also 23.  She was started on insulin.  Her hemoglobin A1c was greater than 13.  She was started on Lantus and discharged on 18 U subcu q.h.s. and NovoLog 3 U a.c. meals.  In spite of this, her blood sugars are consistently well above 400 and 500.  We will need to liberally increase her insulin.  I should note that she does carry a diagnosis of diabetes dating back several years ago, although she has never been on any treatment to my knowledge.  Part of this may have been her constant refusals of lab work.  In fact, there has been no lab work on her chart dating back well over a year.  This is in spite of the fact that she is also hypothyroid, etc.    PAST MEDICAL HISTORY:  Hypothyroidism.    Hyperkalemia.    Anemia of chronic renal failure.   Essential tremor.   Severe chronic venous insufficiency with lymphedema and recurrent venous stasis ulcers.    Pneumonia.    Recurrent cellulitis and abscesses of her leg.   Pressure ulcers of her lower extremities and buttock.   Seizure disorder.    CURRENT MEDICATIONS:  Discharge medications include:   Baclofen 10 mg three times a day.   Tums 1 tablet p.r.n.    Depakote 250 in the morning, 500 at night.    Lasix 20 q.d.    NovoLog 3 U a.c. meals.    Lantus 18 U under the skin at bedtime.   Synthroid 200 mcg per day.   Lopressor 25 q.d.   Florastor 250 mg as needed, diarrhea.   Ultracet 1 tablet q.6 p.r.n.    SOCIAL HISTORY: HOUSING:  As mentioned, the patient has been in the building for roughly five years.   CODE STATUS:  She is a No Code.   FUNCTIONAL STATUS:  She is very resistant to care.  Often refuses to get up, bathe.  As noted, she has refused lab draws.  In fact, there is no TSH on her chart for over a year although this was noted to be within the normal range in the hospital.     REVIEW OF SYSTEMS:   HEENT:  No headache.   CHEST/RESPIRATORY:  No shortness of breath.  CARDIAC:   No chest pain.    GI:  No nausea, vomiting or diarrhea.      PHYSICAL EXAMINATION:   SKIN:  INSPECTION:  She has some superficial wounds on the posterior right leg.  I did not remove the wrap.  I did discuss  this with the wound care nurse.  Lower extremities are really in terrible condition, although there are no open wounds.  This has been problematic for many years.   ASSESSMENT/PLAN:  Type 2 diabetes.  Presenting with hyperosmolar state.  Her sodium was as high as 159.  This will need to be rechecked.  Potassium was 3, also will need to be rechecked.    BUN and creatinine were within the normal range.  As mentioned, our chart a year ago did note diabetes, although she was not on any treatment at the time.    Isolated elevation of alk phos at 189.  I will recheck this.  AST and ALT were normal.    Hypernatremia in the setting of hyperosmolar state.  I will recheck this as well as her potassium.   Seizure disorder.  We will check her valproic acid level.    Hypothyroidism.  On replacement.  She has not had a TSH checked in a long time although this was, fortunately, within the normal range in the hospital.    The patient once again is running  blood sugars well above 400 and 500.   I am going to liberally increase her Lantus and her NovoLog.     CPT CODE: [address]   ADDENDUM:  The patient was revisited to review her lower extremity.  While doing this, I noted that her right hand is significantly swollen.  This is the entire dorsum of her hand.  There is significant tenderness across her metacarpophalangeals, also some swelling of her PIPs with less pain.  At this point, this could be a significant soft tissue infection of the hand and/or a significant arthritis.  She is going to need antibiotics.  It was tempting to blame this on an intravenous at the hospital, although I see no overt sign that this is true.  This is going to need cross-clinical follow-up.

## 2013-07-03 ENCOUNTER — Non-Acute Institutional Stay (SKILLED_NURSING_FACILITY): Payer: Medicaid Other | Admitting: Adult Health

## 2013-07-03 DIAGNOSIS — R259 Unspecified abnormal involuntary movements: Secondary | ICD-10-CM

## 2013-07-03 DIAGNOSIS — R251 Tremor, unspecified: Secondary | ICD-10-CM

## 2013-07-13 ENCOUNTER — Encounter: Payer: Self-pay | Admitting: Internal Medicine

## 2013-09-03 NOTE — Progress Notes (Signed)
Patient ID: [name], female   DOB: [birth year], 62 y.o.   MRN: [id number]  [facility]  Allergies  Allergen Reactions  . Codeine   . Morphine And Related   . Penicillins     Chief Complaint  Patient presents with  . Medical Managment of Chronic Issues    HPI  She is being seen for the management of her chronic illnesses. Overall her status remains unchanged. She will decline lab draws; she has not allowed a lab draw in an extended period of time. She is complaining of increased edema in her lower extremities. We have talked about this at great length; she would like to take lasix daily; she does understand that I will have to order blood work and she will have to allow the staff to draw her blood in order to safely administer her medications; she verbalize understanding.  Past Medical History  Diagnosis Date  . Hypertension   . Thyroid disease     Hypothyroidism  . CHF (congestive heart failure)   . Epilepsy   . Venous insufficiency     No past surgical history on file.  Filed Vitals:   04/24/13 1608  BP: 128/52  Pulse: 74  Weight: 182 lb (82.555 kg)     LABS REVIEWED: SHE WILL DECLINE LAB WORK   NONE RECENT     MEDICATIONS Lopressor 25 mg daily Synthroid 200 mcg daily depakote 250 mg in the am and 500 mg in the pm Baclofen 10 mg three times daily Vit d 2000 units daily ultracet every 6 hours as needed   Review of Systems  Constitutional: Negative for malaise/fatigue.  Respiratory: Negative for cough and shortness of breath.   Cardiovascular: Positive for leg swelling. Negative for chest pain and palpitations.  Gastrointestinal: Negative for heartburn and abdominal pain.  Musculoskeletal: Negative for myalgias and back pain.  Skin: Negative.        Has chronic skin issues with lower legs  Neurological: Negative for headaches.  Psychiatric/Behavioral: Negative for depression. The patient is not nervous/anxious.     .Physical Exam  Constitutional: She  is oriented to person, place, and time. She appears well-developed and well-nourished.  thin  Neck: Neck supple. No JVD present.  Cardiovascular: Normal rate, regular rhythm and intact distal pulses.   Respiratory: Effort normal and breath sounds normal.  GI: Soft. Bowel sounds are normal. She exhibits no distension. There is no tenderness.  Musculoskeletal: She exhibits edema.  Unable to move lower extremities; has 3+ lower extremity edema   Neurological: She is alert and oriented to person, place, and time.  Skin: Skin is warm and dry.  Has unna boots to lower extremities.   Psychiatric: She has a normal mood and affect.   ASSESSMENT AND PLAN  Hypertension: will continue lopressor daily and will monitor  Seizure: will continue depakote and will monitor her status there are no recent reports of seizure activity present.   Hypothyroidism: will continue her synthroid and will monitor  Chronic pain: will continue her baclofen 10 mg three times daily and ultracet every 6 hours as needed for pain   Edema: will begin lasix 20 mg daily in 2 weeks will check cbc; cmp; tsh depakote vit d and will monitor her status   Time spent with patient 50 minutes

## 2013-09-11 ENCOUNTER — Non-Acute Institutional Stay (SKILLED_NURSING_FACILITY): Payer: Medicaid Other | Admitting: Adult Health

## 2013-09-11 DIAGNOSIS — E1310 Other specified diabetes mellitus with ketoacidosis without coma: Secondary | ICD-10-CM

## 2013-09-11 DIAGNOSIS — E1110 Type 2 diabetes mellitus with ketoacidosis without coma: Secondary | ICD-10-CM

## 2013-09-11 DIAGNOSIS — G8929 Other chronic pain: Secondary | ICD-10-CM

## 2013-09-11 DIAGNOSIS — I5032 Chronic diastolic (congestive) heart failure: Secondary | ICD-10-CM

## 2013-09-11 DIAGNOSIS — E039 Hypothyroidism, unspecified: Secondary | ICD-10-CM

## 2013-09-11 DIAGNOSIS — G40909 Epilepsy, unspecified, not intractable, without status epilepticus: Secondary | ICD-10-CM

## 2013-09-11 DIAGNOSIS — I10 Essential (primary) hypertension: Secondary | ICD-10-CM

## 2013-09-11 DIAGNOSIS — I509 Heart failure, unspecified: Secondary | ICD-10-CM

## 2013-09-24 ENCOUNTER — Emergency Department (HOSPITAL_COMMUNITY): Payer: Medicaid Other

## 2013-09-24 ENCOUNTER — Encounter (HOSPITAL_COMMUNITY): Payer: Self-pay | Admitting: Emergency Medicine

## 2013-09-24 ENCOUNTER — Inpatient Hospital Stay (HOSPITAL_COMMUNITY)
Admission: EM | Admit: 2013-09-24 | Discharge: 2013-09-29 | DRG: 871 | Disposition: A | Discharge status: Skilled Nursing Facility | Payer: Medicaid Other | Attending: Internal Medicine | Admitting: Internal Medicine

## 2013-09-24 DIAGNOSIS — A419 Sepsis, unspecified organism: Secondary | ICD-10-CM | POA: Diagnosis present

## 2013-09-24 DIAGNOSIS — J189 Pneumonia, unspecified organism: Secondary | ICD-10-CM

## 2013-09-24 DIAGNOSIS — I5032 Chronic diastolic (congestive) heart failure: Secondary | ICD-10-CM

## 2013-09-24 DIAGNOSIS — I471 Supraventricular tachycardia: Secondary | ICD-10-CM

## 2013-09-24 DIAGNOSIS — Z8701 Personal history of pneumonia (recurrent): Secondary | ICD-10-CM

## 2013-09-24 DIAGNOSIS — I872 Venous insufficiency (chronic) (peripheral): Secondary | ICD-10-CM

## 2013-09-24 DIAGNOSIS — F39 Unspecified mood [affective] disorder: Secondary | ICD-10-CM | POA: Diagnosis present

## 2013-09-24 DIAGNOSIS — D649 Anemia, unspecified: Secondary | ICD-10-CM

## 2013-09-24 DIAGNOSIS — G40909 Epilepsy, unspecified, not intractable, without status epilepticus: Secondary | ICD-10-CM | POA: Diagnosis present

## 2013-09-24 DIAGNOSIS — Z880 Allergy status to penicillin: Secondary | ICD-10-CM

## 2013-09-24 DIAGNOSIS — L02419 Cutaneous abscess of limb, unspecified: Secondary | ICD-10-CM | POA: Diagnosis present

## 2013-09-24 DIAGNOSIS — J9600 Acute respiratory failure, unspecified whether with hypoxia or hypercapnia: Secondary | ICD-10-CM | POA: Diagnosis present

## 2013-09-24 DIAGNOSIS — IMO0002 Reserved for concepts with insufficient information to code with codable children: Secondary | ICD-10-CM | POA: Diagnosis present

## 2013-09-24 DIAGNOSIS — R739 Hyperglycemia, unspecified: Secondary | ICD-10-CM | POA: Diagnosis present

## 2013-09-24 DIAGNOSIS — Z96649 Presence of unspecified artificial hip joint: Secondary | ICD-10-CM

## 2013-09-24 DIAGNOSIS — I509 Heart failure, unspecified: Secondary | ICD-10-CM | POA: Diagnosis present

## 2013-09-24 DIAGNOSIS — R4182 Altered mental status, unspecified: Secondary | ICD-10-CM

## 2013-09-24 DIAGNOSIS — E1165 Type 2 diabetes mellitus with hyperglycemia: Secondary | ICD-10-CM | POA: Diagnosis present

## 2013-09-24 DIAGNOSIS — I878 Other specified disorders of veins: Secondary | ICD-10-CM

## 2013-09-24 DIAGNOSIS — Z6831 Body mass index (BMI) 31.0-31.9, adult: Secondary | ICD-10-CM

## 2013-09-24 DIAGNOSIS — J9601 Acute respiratory failure with hypoxia: Secondary | ICD-10-CM | POA: Diagnosis present

## 2013-09-24 DIAGNOSIS — M8660 Other chronic osteomyelitis, unspecified site: Secondary | ICD-10-CM

## 2013-09-24 DIAGNOSIS — E870 Hyperosmolality and hypernatremia: Secondary | ICD-10-CM

## 2013-09-24 DIAGNOSIS — Z9010 Acquired absence of unspecified breast and nipple: Secondary | ICD-10-CM

## 2013-09-24 DIAGNOSIS — E1110 Type 2 diabetes mellitus with ketoacidosis without coma: Secondary | ICD-10-CM

## 2013-09-24 DIAGNOSIS — E872 Acidosis, unspecified: Secondary | ICD-10-CM | POA: Diagnosis present

## 2013-09-24 DIAGNOSIS — I10 Essential (primary) hypertension: Secondary | ICD-10-CM | POA: Diagnosis present

## 2013-09-24 DIAGNOSIS — E039 Hypothyroidism, unspecified: Secondary | ICD-10-CM | POA: Diagnosis present

## 2013-09-24 DIAGNOSIS — IMO0001 Reserved for inherently not codable concepts without codable children: Secondary | ICD-10-CM | POA: Diagnosis present

## 2013-09-24 DIAGNOSIS — Z993 Dependence on wheelchair: Secondary | ICD-10-CM

## 2013-09-24 DIAGNOSIS — R748 Abnormal levels of other serum enzymes: Secondary | ICD-10-CM | POA: Diagnosis present

## 2013-09-24 DIAGNOSIS — Z66 Do not resuscitate: Secondary | ICD-10-CM | POA: Diagnosis present

## 2013-09-24 HISTORY — DX: Essential tremor: G25.0

## 2013-09-24 HISTORY — DX: Non-pressure chronic ulcer of unspecified part of unspecified lower leg with unspecified severity: L97.909

## 2013-09-24 HISTORY — DX: Morbid (severe) obesity due to excess calories: E66.01

## 2013-09-24 HISTORY — DX: Varicose veins of unspecified lower extremity with ulcer of unspecified site: I83.009

## 2013-09-24 LAB — COMPREHENSIVE METABOLIC PANEL
ALT: 8 U/L (ref 0–35)
AST: 33 U/L (ref 0–37)
Albumin: 2.3 g/dL — ABNORMAL LOW (ref 3.5–5.2)
Alkaline Phosphatase: 184 U/L — ABNORMAL HIGH (ref 39–117)
CO2: 26 mEq/L (ref 19–32)
Chloride: 96 mEq/L (ref 96–112)
Potassium: 4.2 mEq/L (ref 3.5–5.1)
Total Bilirubin: 0.2 mg/dL — ABNORMAL LOW (ref 0.3–1.2)

## 2013-09-24 LAB — VALPROIC ACID LEVEL: Valproic Acid Lvl: 56.1 ug/mL (ref 50.0–100.0)

## 2013-09-24 LAB — CBC WITH DIFFERENTIAL/PLATELET
Basophils Absolute: 0 10*3/uL (ref 0.0–0.1)
Basophils Relative: 0 % (ref 0–1)
Eosinophils Relative: 0 % (ref 0–5)
HCT: 39.3 % (ref 36.0–46.0)
Hemoglobin: 12.9 g/dL (ref 12.0–15.0)
Lymphocytes Relative: 5 % — ABNORMAL LOW (ref 12–46)
Lymphs Abs: 1.2 10*3/uL (ref 0.7–4.0)
MCHC: 32.8 g/dL (ref 30.0–36.0)
MCV: 91.2 fL (ref 78.0–100.0)
Monocytes Absolute: 0.9 10*3/uL (ref 0.1–1.0)
Neutro Abs: 22.4 10*3/uL — ABNORMAL HIGH (ref 1.7–7.7)
Neutrophils Relative %: 91 % — ABNORMAL HIGH (ref 43–77)
RBC: 4.31 MIL/uL (ref 3.87–5.11)
RDW: 15.1 % (ref 11.5–15.5)
WBC: 24.6 10*3/uL — ABNORMAL HIGH (ref 4.0–10.5)

## 2013-09-24 LAB — GLUCOSE, CAPILLARY: Glucose-Capillary: 121 mg/dL — ABNORMAL HIGH (ref 70–99)

## 2013-09-24 LAB — URINALYSIS, ROUTINE W REFLEX MICROSCOPIC
Hgb urine dipstick: NEGATIVE
Ketones, ur: NEGATIVE mg/dL
Protein, ur: NEGATIVE mg/dL
Urobilinogen, UA: 1 mg/dL (ref 0.0–1.0)

## 2013-09-24 LAB — CK: Total CK: 31 U/L (ref 7–177)

## 2013-09-24 LAB — PRO B NATRIURETIC PEPTIDE: Pro B Natriuretic peptide (BNP): 684.6 pg/mL — ABNORMAL HIGH (ref 0–125)

## 2013-09-24 MED ORDER — SODIUM CHLORIDE 0.9 % IV SOLN
1000.0000 mL | Freq: Once | INTRAVENOUS | Status: AC
  Administered 2013-09-24: 1000 mL via INTRAVENOUS

## 2013-09-24 MED ORDER — ENOXAPARIN SODIUM 40 MG/0.4ML ~~LOC~~ SOLN
40.0000 mg | SUBCUTANEOUS | Status: DC
  Administered 2013-09-24 – 2013-09-26 (×3): 40 mg via SUBCUTANEOUS
  Filled 2013-09-24 (×6): qty 0.4

## 2013-09-24 MED ORDER — SACCHAROMYCES BOULARDII 250 MG PO CAPS
250.0000 mg | ORAL_CAPSULE | Freq: Every day | ORAL | Status: DC | PRN
  Filled 2013-09-24: qty 1

## 2013-09-24 MED ORDER — INSULIN ASPART 100 UNIT/ML ~~LOC~~ SOLN: 0.0000 [IU] | SUBCUTANEOUS | Status: DC

## 2013-09-24 MED ORDER — INSULIN ASPART 100 UNIT/ML ~~LOC~~ SOLN: 0.0000 [IU] | Freq: Every day | SUBCUTANEOUS | Status: DC

## 2013-09-24 MED ORDER — METOPROLOL TARTRATE 1 MG/ML IV SOLN: 5.0000 mg | Freq: Four times a day (QID) | INTRAVENOUS | Status: DC | PRN

## 2013-09-24 MED ORDER — INSULIN GLARGINE 100 UNIT/ML ~~LOC~~ SOLN
45.0000 [IU] | Freq: Every day | SUBCUTANEOUS | Status: DC
  Filled 2013-09-24: qty 0.45

## 2013-09-24 MED ORDER — VANCOMYCIN HCL IN DEXTROSE 1-5 GM/200ML-% IV SOLN
1000.0000 mg | Freq: Once | INTRAVENOUS | Status: AC
  Administered 2013-09-24: 1000 mg via INTRAVENOUS
  Filled 2013-09-24: qty 200

## 2013-09-24 MED ORDER — CHLORHEXIDINE GLUCONATE CLOTH 2 % EX PADS
6.0000 | MEDICATED_PAD | Freq: Every day | CUTANEOUS | Status: DC
  Administered 2013-09-25 – 2013-09-28 (×4): 6 via TOPICAL

## 2013-09-24 MED ORDER — BIOTENE DRY MOUTH MT LIQD
15.0000 mL | Freq: Two times a day (BID) | OROMUCOSAL | Status: DC
  Administered 2013-09-24 – 2013-09-28 (×5): 15 mL via OROMUCOSAL

## 2013-09-24 MED ORDER — AZTREONAM 2 G IJ SOLR
2.0000 g | Freq: Once | INTRAMUSCULAR | Status: AC
  Administered 2013-09-24: 2 g via INTRAVENOUS
  Filled 2013-09-24: qty 2

## 2013-09-24 MED ORDER — LORAZEPAM 2 MG/ML IJ SOLN
1.0000 mg | Freq: Once | INTRAMUSCULAR | Status: AC
  Administered 2013-09-24: 1 mg via INTRAVENOUS
  Filled 2013-09-24: qty 1

## 2013-09-24 MED ORDER — INSULIN ASPART 100 UNIT/ML ~~LOC~~ SOLN
0.0000 [IU] | Freq: Three times a day (TID) | SUBCUTANEOUS | Status: DC
  Administered 2013-09-24: 3 [IU] via SUBCUTANEOUS

## 2013-09-24 MED ORDER — DEXTROSE 50 % IV SOLN
INTRAVENOUS | Status: AC
  Filled 2013-09-24: qty 50

## 2013-09-24 MED ORDER — ONDANSETRON HCL 4 MG PO TABS: 4.0000 mg | ORAL_TABLET | Freq: Four times a day (QID) | ORAL | Status: DC | PRN

## 2013-09-24 MED ORDER — SODIUM CHLORIDE 0.9 % IJ SOLN
3.0000 mL | Freq: Two times a day (BID) | INTRAMUSCULAR | Status: DC
  Administered 2013-09-24 – 2013-09-26 (×4): 3 mL via INTRAVENOUS

## 2013-09-24 MED ORDER — VANCOMYCIN HCL 500 MG IV SOLR
500.0000 mg | INTRAVENOUS | Status: AC
  Administered 2013-09-24: 500 mg via INTRAVENOUS
  Filled 2013-09-24: qty 500

## 2013-09-24 MED ORDER — ALUM & MAG HYDROXIDE-SIMETH 200-200-20 MG/5ML PO SUSP: 30.0000 mL | Freq: Four times a day (QID) | ORAL | Status: DC | PRN

## 2013-09-24 MED ORDER — TRAMADOL-ACETAMINOPHEN 37.5-325 MG PO TABS
1.0000 | ORAL_TABLET | Freq: Four times a day (QID) | ORAL | Status: DC | PRN
  Filled 2013-09-24 (×2): qty 1

## 2013-09-24 MED ORDER — VANCOMYCIN HCL IN DEXTROSE 1-5 GM/200ML-% IV SOLN
1000.0000 mg | Freq: Two times a day (BID) | INTRAVENOUS | Status: DC
  Administered 2013-09-25 – 2013-09-28 (×7): 1000 mg via INTRAVENOUS
  Filled 2013-09-24 (×9): qty 200

## 2013-09-24 MED ORDER — LEVOTHYROXINE SODIUM 200 MCG PO TABS
200.0000 ug | ORAL_TABLET | Freq: Every day | ORAL | Status: DC
  Administered 2013-09-24 – 2013-09-29 (×6): 200 ug via ORAL
  Filled 2013-09-24 (×7): qty 1

## 2013-09-24 MED ORDER — ACETAMINOPHEN 500 MG PO TABS
1000.0000 mg | ORAL_TABLET | Freq: Once | ORAL | Status: AC
  Administered 2013-09-24: 1000 mg via ORAL
  Filled 2013-09-24: qty 2

## 2013-09-24 MED ORDER — DEXTROSE 50 % IV SOLN
25.0000 mL | Freq: Once | INTRAVENOUS | Status: AC | PRN
  Administered 2013-09-24: 25 mL via INTRAVENOUS

## 2013-09-24 MED ORDER — INSULIN ASPART 100 UNIT/ML ~~LOC~~ SOLN: 10.0000 [IU] | Freq: Three times a day (TID) | SUBCUTANEOUS | Status: DC

## 2013-09-24 MED ORDER — MUPIROCIN 2 % EX OINT
1.0000 "application " | TOPICAL_OINTMENT | Freq: Two times a day (BID) | CUTANEOUS | Status: DC
  Administered 2013-09-24 – 2013-09-28 (×9): 1 via NASAL
  Filled 2013-09-24: qty 22

## 2013-09-24 MED ORDER — ONDANSETRON HCL 4 MG/2ML IJ SOLN: 4.0000 mg | Freq: Four times a day (QID) | INTRAMUSCULAR | Status: DC | PRN

## 2013-09-24 MED ORDER — DILTIAZEM HCL 100 MG IV SOLR
5.0000 mg/h | INTRAVENOUS | Status: DC
  Filled 2013-09-24: qty 100

## 2013-09-24 MED ORDER — CALCIUM CARBONATE ANTACID 500 MG PO CHEW
1.0000 | CHEWABLE_TABLET | Freq: Every day | ORAL | Status: DC | PRN
  Filled 2013-09-24: qty 1

## 2013-09-24 MED ORDER — ACETAMINOPHEN 325 MG PO TABS
650.0000 mg | ORAL_TABLET | Freq: Four times a day (QID) | ORAL | Status: DC | PRN
  Administered 2013-09-24 – 2013-09-26 (×6): 650 mg via ORAL
  Filled 2013-09-24 (×6): qty 2

## 2013-09-24 MED ORDER — DEXTROSE 5 % IV SOLN
2.0000 g | Freq: Three times a day (TID) | INTRAVENOUS | Status: DC
  Administered 2013-09-24 – 2013-09-28 (×12): 2 g via INTRAVENOUS
  Filled 2013-09-24 (×13): qty 2

## 2013-09-24 MED ORDER — DIVALPROEX SODIUM 500 MG PO DR TAB
500.0000 mg | DELAYED_RELEASE_TABLET | Freq: Two times a day (BID) | ORAL | Status: DC
  Administered 2013-09-24 – 2013-09-28 (×10): 500 mg via ORAL
  Filled 2013-09-24 (×12): qty 1

## 2013-09-24 MED ORDER — DEXTROSE 5 % IV SOLN
1.0000 g | Freq: Three times a day (TID) | INTRAVENOUS | Status: DC
  Filled 2013-09-24: qty 1

## 2013-09-24 MED ORDER — SODIUM CHLORIDE 0.9 % IV SOLN
INTRAVENOUS | Status: DC
  Administered 2013-09-24 – 2013-09-25 (×3): via INTRAVENOUS

## 2013-09-24 MED ORDER — METOPROLOL TARTRATE 25 MG PO TABS
25.0000 mg | ORAL_TABLET | Freq: Every day | ORAL | Status: DC
  Filled 2013-09-24: qty 1

## 2013-09-24 MED ORDER — SODIUM CHLORIDE 0.9 % IV SOLN
1000.0000 mL | INTRAVENOUS | Status: DC
  Administered 2013-09-24: 1000 mL via INTRAVENOUS

## 2013-09-24 MED ORDER — METOPROLOL TARTRATE 25 MG PO TABS
25.0000 mg | ORAL_TABLET | Freq: Every day | ORAL | Status: DC
  Administered 2013-09-26 – 2013-09-28 (×3): 25 mg via ORAL
  Filled 2013-09-24 (×6): qty 1

## 2013-09-24 MED ORDER — BACLOFEN 10 MG PO TABS
10.0000 mg | ORAL_TABLET | Freq: Three times a day (TID) | ORAL | Status: DC
  Administered 2013-09-24 – 2013-09-28 (×14): 10 mg via ORAL
  Filled 2013-09-24 (×17): qty 1

## 2013-09-24 NOTE — ED Notes (Signed)
Per EMS, pt was at breakfast and vomited once-green mucous-B/L LE cellulitis-dressing changed on Monday

## 2013-09-24 NOTE — ED Provider Notes (Signed)
CSN: [id number]     Arrival date & time 09/24/13  1007 History   First MD Initiated Contact with Patient 09/24/13 1012     Chief Complaint  Patient presents with  . Fever   (Consider location/radiation/quality/duration/timing/severity/associated sxs/prior Treatment) HPI  This is a 21 female with a history of hypertension, thyroid disease, and CHF as well as chronic lower extremity cellulitis who presents with fever and emesis. Per EMS report, they were called to the patient's living facility. She had one episode of nonbloody green emesis. She was noted to have a temperature of 104.5 at that time. She has chronic cellulitis and has wound dressing changes twice a week. Was last seen on Monday. She's not currently on any antibiotics.  The patient has no complaints. EMS states that she was satting in the high 80s on room air and they placed her on 2 L of nasal cannula. Patient does not use home oxygen.  Patient's room air sat first was 83%. Initial temperature was 105.7.  Patient denies any headache, chest pain, shortness breath, abdominal pain, urinary symptoms, focal weakness or numbness.  Past Medical History  Diagnosis Date  . Hypertension   . Thyroid disease     Hypothyroidism  . CHF (congestive heart failure)     Status post 2-D echocardiogram 08/12/2010, EF 60-65%, grade 1 diastolic dysfunction  . Epilepsy   . Venous insufficiency   . Morbid obesity   . Venous stasis ulcers   . Essential tremor    Past Surgical History  Procedure Laterality Date  . Bilateral cataract surgery with lens implants    . Right total hip replacement    . Left hip hemiarthroplasty    . Right knee open reduction and internal fixation of patella fracture    . Abdominal hysterectomy     No family history on file. History  Substance Use Topics  . Smoking status: Never Smoker   . Smokeless tobacco: Never Used  . Alcohol Use: No   OB History   Grav Para Term Preterm Abortions TAB SAB Ect Mult Living               Review of Systems  Constitutional: Positive for fever. Negative for chills.  HENT: Negative for congestion.   Respiratory: Negative for cough, chest tightness and shortness of breath.   Cardiovascular: Negative for chest pain.  Gastrointestinal: Positive for nausea and vomiting. Negative for abdominal pain.  Genitourinary: Negative for dysuria.  Musculoskeletal: Negative for back pain.  Skin: Positive for wound.       Chronic lower extremity cellulitis  Neurological: Negative for seizures and headaches.  Psychiatric/Behavioral: Negative for confusion.  All other systems reviewed and are negative.    Allergies  Codeine; Morphine and related; and Penicillins  Home Medications   No current outpatient prescriptions on file. BP 153/44  Pulse 85  Temp(Src) 100 F (37.8 C) (Core (Comment))  Resp 25  Ht 5\' 4"  (1.626 m)  Wt 179 lb 7.3 oz (81.4 kg)  BMI 30.79 kg/m2  SpO2 99% Physical Exam  Nursing note and vitals reviewed. Constitutional: She is oriented to person, place, and time. No distress.  Chronically ill-appearing, anxious  HENT:  Head: Normocephalic and atraumatic.  Right Ear: External ear normal.  Left Ear: External ear normal.  Mucous membranes dry  Eyes: Pupils are equal, round, and reactive to light.  Neck: Neck supple.  Cardiovascular: Regular rhythm and normal heart sounds.   No murmur heard. Tachycardia  Pulmonary/Chest: Effort normal. No  respiratory distress. She has no wheezes.  Coarse breath sounds bilaterally, fair movement  Abdominal: Soft. Bowel sounds are normal. There is no tenderness.  Musculoskeletal:  Bilateral lower extremities notable for left greater than right chronic wounds above the knee. Mild erythema noted over the anterior left thigh. 3+ edema bilateral lower extremities.  Neurological: She is alert and oriented to person, place, and time.  Skin: Skin is warm and dry.  See skin findings above  Psychiatric: She has a normal  mood and affect.    ED Course  Procedures (including critical care time) Labs Review Labs Reviewed  CBC WITH DIFFERENTIAL - Abnormal; Notable for the following:    WBC 24.6 (*)    Platelets 438 (*)    Neutrophils Relative % 91 (*)    Neutro Abs 22.4 (*)    Lymphocytes Relative 5 (*)    All other components within normal limits  COMPREHENSIVE METABOLIC PANEL - Abnormal; Notable for the following:    Glucose, Bld 159 (*)    Albumin 2.3 (*)    Alkaline Phosphatase 184 (*)    Total Bilirubin 0.2 (*)    All other components within normal limits  PRO B NATRIURETIC PEPTIDE - Abnormal; Notable for the following:    Pro B Natriuretic peptide (BNP) 684.6 (*)    All other components within normal limits  URINALYSIS, ROUTINE W REFLEX MICROSCOPIC - Abnormal; Notable for the following:    pH 8.5 (*)    All other components within normal limits  CG4 I-STAT (LACTIC ACID) - Abnormal; Notable for the following:    Lactic Acid, Venous 2.93 (*)    All other components within normal limits  CULTURE, BLOOD (ROUTINE X 2)  CULTURE, BLOOD (ROUTINE X 2)  URINE CULTURE  MRSA PCR SCREENING  CK  TSH  HEMOGLOBIN A1C  VALPROIC ACID LEVEL   Imaging Review Dg Chest Port 1 View  09/24/2013   CLINICAL DATA:  62 year old female hypertension, fever and tremor.  EXAM: PORTABLE CHEST - 1 VIEW  COMPARISON:  08/15/2010 and earlier.  FINDINGS: Portable AP semi upright view at 1048 hrs. Bibasilar hypoventilation, but improved compared to prior. There is confluent left lung base opacity. Postoperative changes to the left axilla again noted. Normal cardiac size and mediastinal contours. Visualized tracheal air column is within normal limits. No pneumothorax or edema. No large effusion.  IMPRESSION: Confluent left lung base opacity suspicious for pneumonia in this setting.   Electronically Signed   By: Augusto Gamble M.D.   On: 09/24/2013 11:05    EKG Interpretation   None      EKG independently reviewed by myself:  Sinus tachycardia with a rate of 129, no evidence of ST elevation MDM   1. Healthcare-associated pneumonia   2. Sepsis    Is a 56 are old female who presents tachycardic and febrile. She is also hypoxic on exam. She was placed on 2 L nasal cannula. Patient was given Tylenol. Sepsis alert was initiated.  Patient was given vancomycin  And aztreonam.  Lactate is 2. Patient has never been hypotensive. X-ray is concerning for pneumonia. Patient will be admitted for sepsis to the step down unit.    Shon Baton, MD 09/24/13 [phone]

## 2013-09-24 NOTE — Progress Notes (Signed)
[id number]/Keyonni Percival, RN, BSN, CCM [phone] Chart Reviewed for discharge and hospital needs. Discharge needs at time of review:  None Review of patient progress due on [id number]. 

## 2013-09-24 NOTE — Progress Notes (Signed)
Patient from [facility]; patient is alert and oriented and stated that she has a do not resuscitate order.  No DNR paperwork was sent from [facility].  Lacinda Axon was contacted and paperwork requested.  Dr. Darnelle Catalan made aware; patient code status changed to DNR per Dr. Darnelle Catalan.  Kinnie Feil, RN

## 2013-09-24 NOTE — ED Notes (Signed)
Bed: RESB Expected date:  Expected time:  Means of arrival:  Comments: EMS - fever 

## 2013-09-24 NOTE — H&P (Addendum)
[facility] History and Physical  [name] FAO:[id number] DOB: 1951-08-18 DOA: 09/24/2013  Referring physician: Dr. Wilkie Aye PCP: Terald Sleeper, MD   Chief Complaint: Fever with vomiting.  History of Present Illness: [name] is an 62 y.o. female with PMH of chronic lower extremity venous insufficiency/stasis ulcers, morbid obesity, chronic diastolic congestive heart failure with last echocardiogram done 08/12/2010 showing EF of 60-65% and grade 1 diastolic dysfunction was brought to the hospital from her nursing home via EMS where she was noted to have one episode of nonbloody green emesis and a temperature documented at 104.5. Her chronic lower extremity wounds are unchanged over baseline and her Unna boot gets changed twice weekly. Upon initial evaluation by EMS, she was found to be hypoxic with oxygen saturations in the mid-to high 80s. Upon arrival in the ER, her room air saturation was 83%. Initial temperature was 105.7. The patient specifically denies any dyspnea, cough, fevers, nausea/vomiting. She is completely oriented to the date, place and herself. Initial workup in the emergency department does reveal chest x-ray findings consistent with healthcare associated pneumonia.  Review of Systems: *Caveat: The patient denies N/V but she was noted to have vomiting PTA* Constitutional: No fever, no chills;  Appetite normal; No weight loss, no weight gain, no fatigue.  HEENT: No blurry vision, no diplopia, no pharyngitis, no dysphagia CV: No chest pain, no palpitations, no PND.  Resp: No SOB, no cough, no pleuritic pain. GI: No nausea, no vomiting, no diarrhea, no melena, no hematochezia, no constipation.  GU: No dysuria, no hematuria, no frequency, no urgency. MSK: no myalgias, no arthralgias.  Neuro:  No headache, no focal neurological deficits, no history of seizures.  Psych: No depression, no anxiety.  Endo: No heat intolerance, no cold intolerance, no polyuria, no polydipsia   Skin: No rashes, no skin lesions.  Heme: No easy bruising.  Travel history: None.  Past Medical History Past Medical History  Diagnosis Date  . Hypertension   . Thyroid disease     Hypothyroidism  . CHF (congestive heart failure)     Status post 2-D echocardiogram 08/12/2010, EF 60-65%, grade 1 diastolic dysfunction  . Epilepsy   . Venous insufficiency   . Morbid obesity   . Venous stasis ulcers   . Essential tremor      Past Surgical History Past Surgical History  Procedure Laterality Date  . Bilateral cataract surgery with lens implants    . Right total hip replacement    . Left hip hemiarthroplasty    . Right knee open reduction and internal fixation of patella fracture    . Abdominal hysterectomy       Social History: History   Social History  . Marital Status: Single    Spouse Name: N/A    Number of Children: N/A  . Years of Education: N/A   Occupational History  . Not on file.   Social History Main Topics  . Smoking status: Never Smoker   . Smokeless tobacco: Never Used  . Alcohol Use: No  . Drug Use: No  . Sexual Activity: Not on file   Other Topics Concern  . Not on file   Social History Narrative   Resident of [facility] skilled nursing facility. Nonambulatory. Wheelchair-bound. Divorced.    Family History:  + for CAD.  Allergies: Codeine; Morphine and related; and Penicillins  Meds: Prior to Admission medications   Medication Sig Start Date End Date Taking? Authorizing Provider  acetaminophen (TYLENOL) 325 MG tablet  Take 650 mg by mouth every 6 (six) hours as needed for pain (Pain).   Yes Historical Provider, MD  baclofen (LIORESAL) 10 MG tablet Take 10 mg by mouth 3 (three) times daily.   Yes Historical Provider, MD  calcium carbonate (TUMS - DOSED IN MG ELEMENTAL CALCIUM) 500 MG chewable tablet Chew 1 tablet by mouth daily as needed for heartburn (Pt request).   Yes Historical Provider, MD  divalproex (DEPAKOTE) 500 MG DR tablet Take 500  mg by mouth 2 (two) times daily.   Yes Historical Provider, MD  furosemide (LASIX) 20 MG tablet Take 20 mg by mouth daily. Takes in the evening   Yes Historical Provider, MD  insulin glargine (LANTUS) 100 UNIT/ML injection Inject 45 Units into the skin at bedtime.   Yes Historical Provider, MD  insulin lispro (HUMALOG) 100 UNIT/ML injection Inject 10 Units into the skin 3 (three) times daily before meals.   Yes Historical Provider, MD  metFORMIN (GLUCOPHAGE) 500 MG tablet Take 500 mg by mouth 2 (two) times daily with a meal.   Yes Historical Provider, MD  promethazine (PHENERGAN) 25 MG tablet Take 25 mg by mouth every 4 (four) hours as needed for nausea (nausea).   Yes Historical Provider, MD  saccharomyces boulardii (FLORASTOR) 250 MG capsule Take 250 mg by mouth daily as needed (diarrhea).   Yes Historical Provider, MD  traMADol-acetaminophen (ULTRACET) 37.5-325 MG per tablet Take 1 tablet by mouth every 6 (six) hours as needed for pain (pain).   Yes Historical Provider, MD  levothyroxine (SYNTHROID, LEVOTHROID) 200 MCG tablet Take 200 mcg by mouth daily.    Historical Provider, MD  metoprolol tartrate (LOPRESSOR) 25 MG tablet Take 25 mg by mouth daily.    Historical Provider, MD    Physical Exam: Filed Vitals:   09/24/13 1018 09/24/13 1058  BP: 132/70 114/78  Pulse: 95 146  Temp: 105.7 F (40.9 C)   TempSrc: Rectal   Resp: 20   SpO2: 83%      Physical Exam: Blood pressure 114/78, pulse 146, temperature 105.7 F (40.9 C), temperature source Rectal, resp. rate 20, SpO2 83.00%. Gen: No acute distress. Morbidly obese. Head: Normocephalic, atraumatic. Eyes: PERRL, EOMI, sclerae nonicteric. Mouth: Oropharynx clear with dry mucous membranes. Poor dentition. Neck: Supple, no thyromegaly, no lymphadenopathy, no jugular venous distention. Chest: Lungs diminished throughout. CV: Heart sounds are regular with tachycardia. No murmurs, rubs, or gallops. Abdomen: Soft, nontender, nondistended  with normal active bowel sounds. Extremities: Extremities with significant erythema and swelling. Boot on the right lower extremity. Weeping edema. Skin: Warm and dry. Venous stasis ulcers and changes to the lower extremities bilaterally. Neuro: Alert and oriented times 3; cranial nerves II through XII grossly intact. Psych: Mood normal, affect flat.  Labs on Admission:  Basic Metabolic Panel:  Recent Labs Lab 09/24/13 1040  NA 135  K 4.2  CL 96  CO2 26  GLUCOSE 159*  BUN 16  CREATININE 0.66  CALCIUM 9.0   Liver Function Tests:  Recent Labs Lab 09/24/13 1040  AST 33  ALT 8  ALKPHOS 184*  BILITOT 0.2*  PROT 7.4  ALBUMIN 2.3*   CBC:  Recent Labs Lab 09/24/13 1040  WBC 24.6*  NEUTROABS 22.4*  HGB 12.9  HCT 39.3  MCV 91.2  PLT 438*   Cardiac Enzymes:  Recent Labs Lab 09/24/13 1040  CKTOTAL 31    BNP (last 3 results)  Recent Labs  09/24/13 1040  PROBNP 684.6*   CBG: No results found  for this basename: GLUCAP,  in the last 168 hours  Radiological Exams on Admission: Dg Chest Port 1 View  09/24/2013   CLINICAL DATA:  62 year old female hypertension, fever and tremor.  EXAM: PORTABLE CHEST - 1 VIEW  COMPARISON:  08/15/2010 and earlier.  FINDINGS: Portable AP semi upright view at 1048 hrs. Bibasilar hypoventilation, but improved compared to prior. There is confluent left lung base opacity. Postoperative changes to the left axilla again noted. Normal cardiac size and mediastinal contours. Visualized tracheal air column is within normal limits. No pneumothorax or edema. No large effusion.  IMPRESSION: Confluent left lung base opacity suspicious for pneumonia in this setting.   Electronically Signed   By: Augusto Gamble M.D.   On: 09/24/2013 11:05    EKG: Independently reviewed. Although read as atrial fibrillation at 129 beats per minute with PVCs and nonspecific polarization abnormality, diffuse leads with prolonged QT interval, I see P waves and the patient's  heart rate sounds regular on exam.  Assessment/Plan Principal Problem:   Sepsis rule out healthcare associated pneumonia versus cellulitis as source with lactic acidosis -Patient meets criteria for sepsis with fever, leukocytosis, and elevated heart rate. -Source thought to be from healthcare associated pneumonia versus cellulitis. The patient is largely asymptomatic other than her episode of nausea/vomiting. She has an abnormal chest x-ray and chronic lower extremity wounds, either of which may be the source of her sepsis. The hypoxia suggests that this is from healthcare associated pneumonia. -Monitor and step down unit. Currently hemodynamically stable. -Empiric aztreonam and vancomycin ordered. -Gently hydrate given history of chronic diastolic heart failure. -Monitor lactic acid level. Active Problems:   Acute hypoxic respiratory failure -Supplemental oxygen ordered. Antibiotics to clear out healthcare associated pneumonia ordered.   Hypertension -Currently hemodynamically stable. Continue metoprolol as blood pressure allows. Hold Lasix.   Hypothyroidism -Continue Synthroid. Followup TSH.   Lower extremity venous stasis -Wound care consult.   Seizure disorder -Continue Depakote. Check Depakote levels.   Elevated alkaline phosphatase level -Hydrate and recheck in the morning.   Uncontrolled Type 2 diabetes/Hyperglycemia -Continue Lantus and lispro. Hold metformin. Add SSI, insulin resistant scale. -Check hemoglobin A1c.   Chronic diastolic CHF (congestive heart failure) -Watch I and O. balance closely. Gently hydrate and hold Lasix for now.  Code Status: Full. Family Communication: No family at bedside. Disposition Plan: SNF.  Time spent: 1 hour.  Winona Sison [facility] Pager [phone]  If 7PM-7AM, please contact night-coverage [email or web address] Password [email or web address] 09/24/2013, 12:51 PM

## 2013-09-24 NOTE — Progress Notes (Signed)
ANTIBIOTIC CONSULT NOTE - FOLLOW UP  Pharmacy Consult for aztreonam and vancomycin Indication: sepsis; r/o HCAP vs cellulits  Allergies  Allergen Reactions  . Codeine   . Morphine And Related   . Penicillins     Patient Measurements:   On 06/09/13,  Wt 82.2 kg Ht 163 cm  Vital Signs: Temp: 105.7 F (40.9 C) (10/09 1018) Temp src: Rectal (10/09 1018) BP: 114/78 mmHg (10/09 1058) Pulse Rate: 146 (10/09 1058) Intake/Output from previous day:   Intake/Output from this shift:    Labs:  Recent Labs  09/24/13 1040  WBC 24.6*  HGB 12.9  PLT 438*  CREATININE 0.66   Estimated CrCl 29mL/min/72kg    Microbiology: No results found for this or any previous visit (from the past 720 hour(s)).  Anti-infectives   Start     Dose/Rate Route Frequency Ordered Stop   09/25/13 0100  vancomycin (VANCOCIN) IVPB 1000 mg/200 mL premix     1,000 mg 200 mL/hr over 60 Minutes Intravenous Every 12 hours 09/24/13 1155     09/24/13 1800  aztreonam (AZACTAM) 1 g in dextrose 5 % 50 mL IVPB     1 g 100 mL/hr over 30 Minutes Intravenous Every 8 hours 09/24/13 1155     09/24/13 1200  vancomycin (VANCOCIN) 500 mg in sodium chloride 0.9 % 100 mL IVPB    Comments:  Give in addition to 1000mg  already ordered, to make total of 1500mg  on 09/24/13.   500 mg 100 mL/hr over 60 Minutes Intravenous STAT 09/24/13 1152 09/24/13 1312   09/24/13 1100  aztreonam (AZACTAM) 2 g in dextrose 5 % 50 mL IVPB     2 g 100 mL/hr over 30 Minutes Intravenous  Once 09/24/13 1026 09/24/13 1215   09/24/13 1100  vancomycin (VANCOCIN) IVPB 1000 mg/200 mL premix     1,000 mg 200 mL/hr over 60 Minutes Intravenous  Once 09/24/13 1026 09/24/13 1208      Assessment: 62 y/o F with sepsis, r/o HCAP, r/o cellulitis.  Received vancomycin 1 gram and aztreonam 2 grams in ED.  To continue vancomycin and aztreonam with pharmacy dosing.  Goal of Therapy:  Eradication of infection Adjust antibiotic dosages for weight and renal  function Vancomycin trough 15-20  Plan:  1. Additional vancomycin 500 mg IV x1 (to make 1500 mg total loading dose), then 1000 mg IV q12h 2. Aztreonam 2 g IV q8h 3. Follow serum creatinine. 4. Check vancomycin trough at steady-state.  Elie Goody, PharmD, BCPS Pager: [phone] 09/24/2013  1:37 PM

## 2013-09-25 DIAGNOSIS — D649 Anemia, unspecified: Secondary | ICD-10-CM

## 2013-09-25 DIAGNOSIS — J189 Pneumonia, unspecified organism: Secondary | ICD-10-CM

## 2013-09-25 LAB — GLUCOSE, CAPILLARY
Glucose-Capillary: 60 mg/dL — ABNORMAL LOW (ref 70–99)
Glucose-Capillary: 89 mg/dL (ref 70–99)
Glucose-Capillary: 90 mg/dL (ref 70–99)
Glucose-Capillary: 138 mg/dL — ABNORMAL HIGH (ref 70–99)
Glucose-Capillary: 120 mg/dL — ABNORMAL HIGH (ref 70–99)
Glucose-Capillary: 165 mg/dL — ABNORMAL HIGH (ref 70–99)

## 2013-09-25 LAB — COMPREHENSIVE METABOLIC PANEL
ALT: 5 U/L (ref 0–35)
AST: 22 U/L (ref 0–37)
Albumin: 1.7 g/dL — ABNORMAL LOW (ref 3.5–5.2)
Alkaline Phosphatase: 131 U/L — ABNORMAL HIGH (ref 39–117)
CO2: 24 mEq/L (ref 19–32)
Chloride: 100 mEq/L (ref 96–112)
Creatinine, Ser: 0.69 mg/dL (ref 0.50–1.10)
GFR calc non Af Amer: >90 mL/min (ref >90)
Potassium: 3.9 mEq/L (ref 3.5–5.1)
Total Bilirubin: 0.2 mg/dL — ABNORMAL LOW (ref 0.3–1.2)

## 2013-09-25 LAB — CBC
MCH: 30 pg (ref 26.0–34.0)
MCHC: 32.9 g/dL (ref 30.0–36.0)
MCV: 91.2 fL (ref 78.0–100.0)
Platelets: 379 10*3/uL (ref 150–400)
RDW: 15.4 % (ref 11.5–15.5)

## 2013-09-25 LAB — URINE CULTURE: Colony Count: NO GROWTH

## 2013-09-25 LAB — LACTIC ACID, PLASMA: Lactic Acid, Venous: 2.3 mmol/L — ABNORMAL HIGH (ref 0.5–2.2)

## 2013-09-25 MED ORDER — ADULT MULTIVITAMIN W/MINERALS CH
1.0000 | ORAL_TABLET | Freq: Every day | ORAL | Status: DC
  Administered 2013-09-25: 1 via ORAL
  Filled 2013-09-25 (×5): qty 1

## 2013-09-25 MED ORDER — INSULIN GLARGINE 100 UNIT/ML ~~LOC~~ SOLN: 58.0000 [IU] | Freq: Every day | SUBCUTANEOUS | Status: DC

## 2013-09-25 MED ORDER — INSULIN GLARGINE 100 UNIT/ML ~~LOC~~ SOLN
45.0000 [IU] | Freq: Every day | SUBCUTANEOUS | Status: DC
  Administered 2013-09-25 – 2013-09-28 (×4): 45 [IU] via SUBCUTANEOUS
  Filled 2013-09-25 (×5): qty 0.45

## 2013-09-25 MED ORDER — GLUCERNA SHAKE PO LIQD
237.0000 mL | Freq: Every day | ORAL | Status: DC | PRN
  Filled 2013-09-25: qty 237

## 2013-09-25 MED ORDER — INSULIN ASPART 100 UNIT/ML ~~LOC~~ SOLN
0.0000 [IU] | Freq: Three times a day (TID) | SUBCUTANEOUS | Status: DC
  Administered 2013-09-25: 3 [IU] via SUBCUTANEOUS
  Administered 2013-09-26: 5 [IU] via SUBCUTANEOUS

## 2013-09-25 MED ORDER — INSULIN ASPART 100 UNIT/ML ~~LOC~~ SOLN: 0.0000 [IU] | Freq: Every day | SUBCUTANEOUS | Status: DC

## 2013-09-25 NOTE — Progress Notes (Addendum)
[facility] PROGRESS NOTE  [name] QIO:[id number] DOB: 1951-10-05 DOA: 09/24/2013 PCP: Terald Sleeper, MD  Brief narrative: [name] is an 62 y.o. female with PMH of chronic lower extremity venous insufficiency/stasis ulcers, morbid obesity, chronic diastolic congestive heart failure with last echocardiogram done 08/12/2010 showing EF of 60-65% and grade 1 diastolic dysfunction was brought to the hospital from her nursing home via EMS where she was noted to have one episode of nonbloody green emesis and a temperature documented at 104.5. Upon initial evaluation by EMS, she was found to be hypoxic with oxygen saturations in the mid-to high 80s. Upon arrival in the ER, her room air saturation was 83%. Initial temperature was 105.7.  Initial workup in the emergency department does reveal chest x-ray findings consistent with healthcare associated pneumonia.  Assessment/Plan: Principal Problem:  Sepsis rule out healthcare associated pneumonia versus cellulitis as source with lactic acidosis  -Source of sepsis thought to be from healthcare associated pneumonia versus cellulitis. She has an abnormal chest x-ray and chronic lower extremity wounds, either of which may be the source of her sepsis. The hypoxia suggests that this is from healthcare associated pneumonia.  -Remains hemodynamically stable, although she did have some low diastolic pressures over the past 24 hours.  -Continue empiric aztreonam and vancomycin ordered.  -Serum lactic acid 2.93---> 2.3. Active Problems:  Acute hypoxic respiratory failure  -Continue supplemental oxygen and empiric antibiotics for healthcare associated pneumonia.  Hypertension  -Currently hemodynamically stable. Continue metoprolol as blood pressure allows. Hold Lasix.  Hypothyroidism / Low TSH  -Continue Synthroid. TSH low. Check free T4.  Lower extremity venous stasis  -Wound care consulted. Refused to let either myself or the wound care RN  remove her RLE Radio broadcast assistant.   -Psychiatric evaluation requested to determine competency. Seizure disorder  -Continue Depakote. Depakote level within normal limits at 56.1.  Elevated alkaline phosphatase level  -Trending down with hydration.  Uncontrolled Type 2 diabetes/Hyperglycemia  -Lantus held yesterday (NPO).  Diet advanced this morning.  Resume home dose Lantus.  Change SSI to resistant scale Q AC/HS. Chronic diastolic CHF (congestive heart failure)  -Watch I and O. balance closely. Gently hydrate and hold Lasix for now. Normocytic anemia -Drop in hemoglobin likely dilutional. Monitor hemoglobin.  Code Status: DNR Family Communication: No family at bedside. Disposition Plan: SNF.   Medical Consultants:  Psychiatry.  Other Consultants:  Wound care  Anti-infectives:  Vancomycin 09/24/2013--->  Aztreonam 09/24/2013--->  HPI/Subjective: [name] refused to let me remove her right lower extremity Unna boot to inspect her skin. She tells me "it's my right ". No nausea or vomiting overnight. Still febrile. Denies shortness of breath/cough.  Objective: Filed Vitals:   09/24/13 2000 09/25/13 0000 09/25/13 0200 09/25/13 0400  BP: 110/36   157/41  Pulse: 85 93 95 88  Temp: 101.3 F (38.5 C) 103.5 F (39.7 C) 102 F (38.9 C) 101.3 F (38.5 C)  TempSrc: Core (Comment) Core (Comment) Core (Comment) Core (Comment)  Resp: 28 28 27 31   Height:      Weight:    81.6 kg (179 lb 14.3 oz)  SpO2: 97% 100% 97% 100%    Intake/Output Summary (Last 24 hours) at 09/25/13 0705 Last data filed at 09/25/13 0600  Gross per 24 hour  Intake 1378.75 ml  Output    415 ml  Net 963.75 ml    Exam: Gen:  Tremulous Cardiovascular:  Tachycardic, regular Respiratory:  Lungs diminished Gastrointestinal:  Abdomen soft, NT/ND, + BS  Extremities:  Venous stasis with lower extremity edema. Right lower extremity Unna boot intact  Data Reviewed: Basic Metabolic Panel:  Recent Labs Lab  09/24/13 1040 09/25/13 0310  NA 135 135  K 4.2 3.9  CL 96 100  CO2 26 24  GLUCOSE 159* 118*  BUN 16 17  CREATININE 0.66 0.69  CALCIUM 9.0 8.3*   GFR Estimated Creatinine Clearance: 73.5 ml/min (by C-G formula based on Cr of 0.69). Liver Function Tests:  Recent Labs Lab 09/24/13 1040 09/25/13 0310  AST 33 22  ALT 8 5  ALKPHOS 184* 131*  BILITOT 0.2* 0.2*  PROT 7.4 5.8*  ALBUMIN 2.3* 1.7*   CBC:  Recent Labs Lab 09/24/13 1040 09/25/13 0310  WBC 24.6* 22.3*  NEUTROABS 22.4*  --   HGB 12.9 10.6*  HCT 39.3 32.2*  MCV 91.2 91.2  PLT 438* 379   Cardiac Enzymes:  Recent Labs Lab 09/24/13 1040  CKTOTAL 31   BNP (last 3 results)  Recent Labs  09/24/13 1040  PROBNP 684.6*   CBG:  Recent Labs Lab 09/24/13 1649 09/24/13 2126 09/25/13 0405  GLUCAP 121* 79 104*   Hgb A1c  Recent Labs  09/24/13 1040  HGBA1C 6.2*   Thyroid function studies  Recent Labs  09/24/13 1040  TSH 0.188*   Microbiology Recent Results (from the past 240 hour(s))  MRSA PCR SCREENING     Status: Abnormal   Collection Time    09/24/13  1:59 PM      Result Value Range Status   MRSA by PCR POSITIVE (*) NEGATIVE Final   Comment:            The GeneXpert MRSA Assay (FDA     approved for NASAL specimens     only), is one component of a     comprehensive MRSA colonization     surveillance program. It is not     intended to diagnose MRSA     infection nor to guide or     monitor treatment for     MRSA infections.     RESULT CALLED TO, READ BACK BY AND VERIFIED WITH:     STACY PHELPS,RN [id number] @ 1630 BY J SCOTTON     Procedures and Diagnostic Studies: Dg Chest Port 1 View  09/24/2013   CLINICAL DATA:  62 year old female hypertension, fever and tremor.  EXAM: PORTABLE CHEST - 1 VIEW  COMPARISON:  08/15/2010 and earlier.  FINDINGS: Portable AP semi upright view at 1048 hrs. Bibasilar hypoventilation, but improved compared to prior. There is confluent left lung base  opacity. Postoperative changes to the left axilla again noted. Normal cardiac size and mediastinal contours. Visualized tracheal air column is within normal limits. No pneumothorax or edema. No large effusion.  IMPRESSION: Confluent left lung base opacity suspicious for pneumonia in this setting.   Electronically Signed   By: Augusto Gamble M.D.   On: 09/24/2013 11:05    Scheduled Meds: . antiseptic oral rinse  15 mL Mouth Rinse BID  . aztreonam  2 g Intravenous Q8H  . baclofen  10 mg Oral TID  . Chlorhexidine Gluconate Cloth  6 each Topical Q0600  . divalproex  500 mg Oral BID  . enoxaparin (LOVENOX) injection  40 mg Subcutaneous Q24H  . insulin aspart  0-20 Units Subcutaneous Q4H  . levothyroxine  200 mcg Oral QAC breakfast  . metoprolol tartrate  25 mg Oral Daily  . mupirocin ointment  1 application Nasal BID  . sodium chloride  3 mL Intravenous Q12H  . vancomycin  1,000 mg Intravenous Q12H   Continuous Infusions: . sodium chloride 75 mL/hr at 09/25/13 0003    Time spent: 35 minutes.  The patient is critically ill and requires complex decision-making.   LOS: 1 day   RAMA,CHRISTINA  [facility] Pager [phone].   *Please note that the hospitalists switch teams on Wednesdays. Please call the flow manager at [phone] if you are having difficulty reaching the hospitalist taking care of this patient as she can update you and provide the most up-to-date pager number of provider caring for the patient. If 8PM-8AM, please contact night-coverage at [email or web address], password [facility]  09/25/2013, 7:05 AM

## 2013-09-25 NOTE — Consult Note (Addendum)
WOC wound consult note Reason for Consult: Wound care consult requested for buttocks and right leg.  Pt wearing Una boot to right leg for reported stasis ulcers.  Pt refuses to have Una boot removed to assess wounds to leg and denies offer to turn to have buttock area assessed.  She has refused to allow wound assessments from bedside nurse, primary physician, and WOC nurse. She answers all questions appropriately and states, "it's my right to refuse to have you look." Please re-consult if further assistance is needed or if pt decides to allow wound consult to be performed. Thank-you,  Cammie Mcgee MSN, RN, CWOCN, [place], CNS [phone]

## 2013-09-25 NOTE — Progress Notes (Signed)
INITIAL NUTRITION ASSESSMENT  DOCUMENTATION CODES Per approved criteria  -Obesity Unspecified   INTERVENTION: Add Glucerna Shake po daily prn, each supplement provides 220 kcal and 10 grams of protein, please encourage if meal intake is poor to maximize wound healing. Add MVI daily. RD to continue to follow nutrition care plan.  NUTRITION DIAGNOSIS: Increased nutrient needs related to wound healing as evidenced by estimated needs.   Goal: Intake to meet >90% of estimated nutrition needs.  Monitor:  weight trends, lab trends, I/O's, PO intake, supplement tolerance  Reason for Assessment: Malnutrition Screening Tool  62 y.o. female  Admitting Dx: Sepsis  ASSESSMENT: PMHx significant for chronic lower extremity venous insufficiency/stasis ulcers, morbid obesity, chronic diastolic CHF. From [facility]. Admitted with nonbloody green emesis and a temperature of 104.5. Work-up reveals sepsis from HCAP vs cellulitis.  Currently ordered for Carbohydrate Modified Medium diet and is eating 100% of meals. Pt noted to have a stage II on both R + L buttocks. Pt reports that she is eating well and was eating well PTA. Consumed 2 slices of pizza for lunch. Confirms that her weight is stable. Pt is at nutrition risk 2/2 skin breakdown.   Height: Ht Readings from Last 1 Encounters:  09/24/13 5\' 4"  (1.626 m)    Weight: Wt Readings from Last 1 Encounters:  09/25/13 179 lb 14.3 oz (81.6 kg)    Ideal Body Weight: 120 lb  % Ideal Body Weight: 149%  Wt Readings from Last 10 Encounters:  09/25/13 179 lb 14.3 oz (81.6 kg)  06/11/13 181 lb 3.5 oz (82.2 kg)  04/24/13 182 lb (82.555 kg)    Usual Body Weight: 180 lb  % Usual Body Weight: 99%  BMI:  Body mass index is 30.86 kg/(m^2). Obese Class I  Estimated Nutritional Needs: Kcal: 1500 - 1800 Protein: 95 - 110 g  Fluid: 1.8 - 2 liters  Skin: stage II pressure ulcer on R + L buttocks  Diet Order: Carb Control  EDUCATION  NEEDS: -No education needs identified at this time   Intake/Output Summary (Last 24 hours) at 09/25/13 1300 Last data filed at 09/25/13 1100  Gross per 24 hour  Intake 2043.75 ml  Output    650 ml  Net 1393.75 ml    Last BM: 10/9  Labs:   Recent Labs Lab 09/24/13 1040 09/25/13 0310  NA 135 135  K 4.2 3.9  CL 96 100  CO2 26 24  BUN 16 17  CREATININE 0.66 0.69  CALCIUM 9.0 8.3*  GLUCOSE 159* 118*    CBG (last 3)   Recent Labs  09/25/13 0405 09/25/13 0743 09/25/13 1236  GLUCAP 104* 89 138*    Scheduled Meds: . antiseptic oral rinse  15 mL Mouth Rinse BID  . aztreonam  2 g Intravenous Q8H  . baclofen  10 mg Oral TID  . Chlorhexidine Gluconate Cloth  6 each Topical Q0600  . divalproex  500 mg Oral BID  . enoxaparin (LOVENOX) injection  40 mg Subcutaneous Q24H  . insulin aspart  0-20 Units Subcutaneous TID WC  . insulin aspart  0-5 Units Subcutaneous QHS  . insulin glargine  45 Units Subcutaneous QHS  . levothyroxine  200 mcg Oral QAC breakfast  . metoprolol tartrate  25 mg Oral Daily  . mupirocin ointment  1 application Nasal BID  . sodium chloride  3 mL Intravenous Q12H  . vancomycin  1,000 mg Intravenous Q12H    Continuous Infusions: . sodium chloride 75 mL/hr at 09/25/13  0700    Past Medical History  Diagnosis Date  . Hypertension   . Thyroid disease     Hypothyroidism  . CHF (congestive heart failure)     Status post 2-D echocardiogram 08/12/2010, EF 60-65%, grade 1 diastolic dysfunction  . Epilepsy   . Venous insufficiency   . Morbid obesity   . Venous stasis ulcers   . Essential tremor     Past Surgical History  Procedure Laterality Date  . Bilateral cataract surgery with lens implants    . Right total hip replacement    . Left hip hemiarthroplasty    . Right knee open reduction and internal fixation of patella fracture    . Abdominal hysterectomy      Jarold Motto MS, RD, LDN Pager: [phone] After-hours pager: [phone]

## 2013-09-26 ENCOUNTER — Inpatient Hospital Stay (HOSPITAL_COMMUNITY): Payer: Medicaid Other

## 2013-09-26 DIAGNOSIS — G40909 Epilepsy, unspecified, not intractable, without status epilepticus: Secondary | ICD-10-CM

## 2013-09-26 DIAGNOSIS — J9600 Acute respiratory failure, unspecified whether with hypoxia or hypercapnia: Secondary | ICD-10-CM

## 2013-09-26 DIAGNOSIS — I509 Heart failure, unspecified: Secondary | ICD-10-CM

## 2013-09-26 DIAGNOSIS — F39 Unspecified mood [affective] disorder: Secondary | ICD-10-CM

## 2013-09-26 DIAGNOSIS — E1310 Other specified diabetes mellitus with ketoacidosis without coma: Secondary | ICD-10-CM

## 2013-09-26 DIAGNOSIS — I10 Essential (primary) hypertension: Secondary | ICD-10-CM

## 2013-09-26 DIAGNOSIS — I5032 Chronic diastolic (congestive) heart failure: Secondary | ICD-10-CM

## 2013-09-26 DIAGNOSIS — R4182 Altered mental status, unspecified: Secondary | ICD-10-CM

## 2013-09-26 LAB — BASIC METABOLIC PANEL
CO2: 25 mEq/L (ref 19–32)
Calcium: 8.9 mg/dL (ref 8.4–10.5)
Creatinine, Ser: 0.51 mg/dL (ref 0.50–1.10)
GFR calc Af Amer: >90 mL/min (ref >90)
GFR calc non Af Amer: >90 mL/min (ref >90)
Potassium: 3.7 mEq/L (ref 3.5–5.1)
Sodium: 135 mEq/L (ref 135–145)

## 2013-09-26 LAB — GLUCOSE, CAPILLARY
Glucose-Capillary: 138 mg/dL — ABNORMAL HIGH (ref 70–99)
Glucose-Capillary: 65 mg/dL — ABNORMAL LOW (ref 70–99)

## 2013-09-26 LAB — CBC
MCV: 92.3 fL (ref 78.0–100.0)
Platelets: 354 10*3/uL (ref 150–400)
RBC: 3.91 MIL/uL (ref 3.87–5.11)
RDW: 15.6 % — ABNORMAL HIGH (ref 11.5–15.5)
WBC: 23.4 10*3/uL — ABNORMAL HIGH (ref 4.0–10.5)

## 2013-09-26 LAB — PRO B NATRIURETIC PEPTIDE: Pro B Natriuretic peptide (BNP): 3477 pg/mL — ABNORMAL HIGH (ref 0–125)

## 2013-09-26 LAB — URINALYSIS, ROUTINE W REFLEX MICROSCOPIC
Bilirubin Urine: NEGATIVE
Glucose, UA: NEGATIVE mg/dL
Ketones, ur: NEGATIVE mg/dL
Protein, ur: NEGATIVE mg/dL
pH: 6 (ref 5.0–8.0)

## 2013-09-26 LAB — URINE MICROSCOPIC-ADD ON

## 2013-09-26 LAB — STREP PNEUMONIAE URINARY ANTIGEN: Strep Pneumo Urinary Antigen: NEGATIVE

## 2013-09-26 MED ORDER — FUROSEMIDE 10 MG/ML IJ SOLN
20.0000 mg | Freq: Once | INTRAMUSCULAR | Status: AC
  Administered 2013-09-26: 20 mg via INTRAVENOUS
  Filled 2013-09-26: qty 2

## 2013-09-26 NOTE — Consult Note (Signed)
Reason for Consult: Capacity evaluation and sepsis and multiple medical problems Referring Physician: Dr. Aundra Dubin is an 62 y.o. female.  HPI: Patient is seen and chart reviewed. Patient stated that she was upset with some staff members because she was not respected and she likes to be moved to different floor. She stated that her physician agree with her sister and she was upset. She has stated that her sister has mental illness and she was not a good historian or support to her. She denied symptoms of depression, anxiety and psychosis. She endorses having periods of irritability and getting upset.   [name] is an 62 y.o. female with PMH of chronic lower extremity venous insufficiency/stasis ulcers, morbid obesity, chronic diastolic congestive heart failure with last echocardiogram done 08/12/2010 showing EF of 60-65% and grade 1 diastolic dysfunction was brought to the hospital from her nursing home via EMS where she was noted to have one episode of nonbloody green emesis and a temperature documented at 104.5. Upon initial evaluation by EMS, she was found to be hypoxic with oxygen saturations in the mid-to high 80s. Upon arrival in the ER, her room air saturation was 83%. Initial temperature was 105.7. Initial workup in the emergency department does reveal chest x-ray findings consistent with healthcare associated pneumonia   MSE: Patient has fine mood and appropriate affect. She has normal speech and thought process. She has denied SI/HI and psychosis. She has fair insight, judgment and impulse control.   Past Medical History  Diagnosis Date  . Hypertension   . Thyroid disease     Hypothyroidism  . CHF (congestive heart failure)     Status post 2-D echocardiogram 08/12/2010, EF 60-65%, grade 1 diastolic dysfunction  . Epilepsy   . Venous insufficiency   . Morbid obesity   . Venous stasis ulcers   . Essential tremor     Past Surgical History  Procedure Laterality Date  .  Bilateral cataract surgery with lens implants    . Right total hip replacement    . Left hip hemiarthroplasty    . Right knee open reduction and internal fixation of patella fracture    . Abdominal hysterectomy      No family history on file.  Social History:  reports that she has never smoked. She has never used smokeless tobacco. She reports that she does not drink alcohol or use illicit drugs.  Allergies:  Allergies  Allergen Reactions  . Codeine   . Morphine And Related   . Penicillins     Medications: I have reviewed the patient's current medications.  Results for orders placed during the hospital encounter of 09/24/13 (from the past 48 hour(s))  GLUCOSE, CAPILLARY     Status: Abnormal   Collection Time    09/24/13  4:49 PM      Result Value Range   Glucose-Capillary 121 (*) 70 - 99 mg/dL   Comment 1 Documented in Chart     Comment 2 Notify RN    GLUCOSE, CAPILLARY     Status: None   Collection Time    09/24/13  9:26 PM      Result Value Range   Glucose-Capillary 79  70 - 99 mg/dL   Comment 1 Documented in Chart     Comment 2 Notify RN    GLUCOSE, CAPILLARY     Status: Abnormal   Collection Time    09/24/13 11:41 PM      Result Value Range  Glucose-Capillary 60 (*) 70 - 99 mg/dL  GLUCOSE, CAPILLARY     Status: None   Collection Time    09/25/13 12:05 AM      Result Value Range   Glucose-Capillary 90  70 - 99 mg/dL  COMPREHENSIVE METABOLIC PANEL     Status: Abnormal   Collection Time    09/25/13  3:10 AM      Result Value Range   Sodium 135  135 - 145 mEq/L   Potassium 3.9  3.5 - 5.1 mEq/L   Chloride 100  96 - 112 mEq/L   CO2 24  19 - 32 mEq/L   Glucose, Bld 118 (*) 70 - 99 mg/dL   BUN 17  6 - 23 mg/dL   Creatinine, Ser 1.61  0.50 - 1.10 mg/dL   Calcium 8.3 (*) 8.4 - 10.5 mg/dL   Total Protein 5.8 (*) 6.0 - 8.3 g/dL   Albumin 1.7 (*) 3.5 - 5.2 g/dL   AST 22  0 - 37 U/L   ALT 5  0 - 35 U/L   Alkaline Phosphatase 131 (*) 39 - 117 U/L   Total Bilirubin  0.2 (*) 0.3 - 1.2 mg/dL   GFR calc non Af Amer >90  >90 mL/min   GFR calc Af Amer >90  >90 mL/min   Comment: (NOTE)     The eGFR has been calculated using the CKD EPI equation.     This calculation has not been validated in all clinical situations.     eGFR's persistently <90 mL/min signify possible Chronic Kidney     Disease.  CBC     Status: Abnormal   Collection Time    09/25/13  3:10 AM      Result Value Range   WBC 22.3 (*) 4.0 - 10.5 K/uL   RBC 3.53 (*) 3.87 - 5.11 MIL/uL   Hemoglobin 10.6 (*) 12.0 - 15.0 g/dL   Comment: DELTA CHECK NOTED     REPEATED TO VERIFY   HCT 32.2 (*) 36.0 - 46.0 %   MCV 91.2  78.0 - 100.0 fL   MCH 30.0  26.0 - 34.0 pg   MCHC 32.9  30.0 - 36.0 g/dL   RDW 09.6  04.5 - 40.9 %   Platelets 379  150 - 400 K/uL  LACTIC ACID, PLASMA     Status: Abnormal   Collection Time    09/25/13  3:10 AM      Result Value Range   Lactic Acid, Venous 2.3 (*) 0.5 - 2.2 mmol/L  GLUCOSE, CAPILLARY     Status: Abnormal   Collection Time    09/25/13  4:05 AM      Result Value Range   Glucose-Capillary 104 (*) 70 - 99 mg/dL  GLUCOSE, CAPILLARY     Status: None   Collection Time    09/25/13  7:43 AM      Result Value Range   Glucose-Capillary 89  70 - 99 mg/dL   Comment 1 Notify RN    T4, FREE     Status: None   Collection Time    09/25/13  8:55 AM      Result Value Range   Free T4 1.58  0.80 - 1.80 ng/dL   Comment: Performed at [facility]  GLUCOSE, CAPILLARY     Status: Abnormal   Collection Time    09/25/13 12:36 PM      Result Value Range   Glucose-Capillary 138 (*) 70 - 99 mg/dL  GLUCOSE, CAPILLARY     Status: Abnormal   Collection Time    09/25/13  5:52 PM      Result Value Range   Glucose-Capillary 120 (*) 70 - 99 mg/dL  GLUCOSE, CAPILLARY     Status: Abnormal   Collection Time    09/25/13 10:38 PM      Result Value Range   Glucose-Capillary 171 (*) 70 - 99 mg/dL  GLUCOSE, CAPILLARY     Status: Abnormal   Collection Time    09/25/13  11:22 PM      Result Value Range   Glucose-Capillary 165 (*) 70 - 99 mg/dL  BASIC METABOLIC PANEL     Status: Abnormal   Collection Time    09/26/13  3:50 AM      Result Value Range   Sodium 135  135 - 145 mEq/L   Potassium 3.7  3.5 - 5.1 mEq/L   Chloride 101  96 - 112 mEq/L   CO2 25  19 - 32 mEq/L   Glucose, Bld 147 (*) 70 - 99 mg/dL   BUN 15  6 - 23 mg/dL   Creatinine, Ser 9.60  0.50 - 1.10 mg/dL   Calcium 8.9  8.4 - 45.4 mg/dL   GFR calc non Af Amer >90  >90 mL/min   GFR calc Af Amer >90  >90 mL/min   Comment: (NOTE)     The eGFR has been calculated using the CKD EPI equation.     This calculation has not been validated in all clinical situations.     eGFR's persistently <90 mL/min signify possible Chronic Kidney     Disease.  CBC     Status: Abnormal   Collection Time    09/26/13  3:50 AM      Result Value Range   WBC 23.4 (*) 4.0 - 10.5 K/uL   RBC 3.91  3.87 - 5.11 MIL/uL   Hemoglobin 11.5 (*) 12.0 - 15.0 g/dL   HCT 09.8  11.9 - 14.7 %   MCV 92.3  78.0 - 100.0 fL   MCH 29.4  26.0 - 34.0 pg   MCHC 31.9  30.0 - 36.0 g/dL   RDW 82.9 (*) 56.2 - 13.0 %   Platelets 354  150 - 400 K/uL  PRO B NATRIURETIC PEPTIDE     Status: Abnormal   Collection Time    09/26/13  3:50 AM      Result Value Range   Pro B Natriuretic peptide (BNP) 3477.0 (*) 0 - 125 pg/mL  GLUCOSE, CAPILLARY     Status: None   Collection Time    09/26/13  8:24 AM      Result Value Range   Glucose-Capillary 89  70 - 99 mg/dL   Comment 1 Documented in Chart     Comment 2 Notify RN    STREP PNEUMONIAE URINARY ANTIGEN     Status: None   Collection Time    09/26/13  8:45 AM      Result Value Range   Strep Pneumo Urinary Antigen NEGATIVE  NEGATIVE   Comment: PERFORMED AT [facility]                Infection due to S. pneumoniae     cannot be absolutely ruled out     since the antigen present     may be below the detection limit     of the test.     Performed at [facility]  GLUCOSE,  CAPILLARY     Status: Abnormal   Collection Time    09/26/13 12:24 PM      Result Value Range   Glucose-Capillary 138 (*) 70 - 99 mg/dL   Comment 1 Documented in Chart     Comment 2 Notify RN    URINALYSIS, ROUTINE W REFLEX MICROSCOPIC     Status: Abnormal   Collection Time    09/26/13  1:51 PM      Result Value Range   Color, Urine YELLOW  YELLOW   APPearance CLEAR  CLEAR   Specific Gravity, Urine 1.016  1.005 - 1.030   pH 6.0  5.0 - 8.0   Glucose, UA NEGATIVE  NEGATIVE mg/dL   Hgb urine dipstick TRACE (*) NEGATIVE   Bilirubin Urine NEGATIVE  NEGATIVE   Ketones, ur NEGATIVE  NEGATIVE mg/dL   Protein, ur NEGATIVE  NEGATIVE mg/dL   Urobilinogen, UA 1.0  0.0 - 1.0 mg/dL   Nitrite NEGATIVE  NEGATIVE   Leukocytes, UA NEGATIVE  NEGATIVE  URINE MICROSCOPIC-ADD ON     Status: None   Collection Time    09/26/13  1:51 PM      Result Value Range   Squamous Epithelial / LPF RARE  RARE   WBC, UA 0-2  <3 WBC/hpf   RBC / HPF 0-2  <3 RBC/hpf    Dg Chest Port 1 View  09/26/2013   CLINICAL DATA:  Cough. History of pneumonia.  EXAM: PORTABLE CHEST - 1 VIEW  COMPARISON:  09/24/2013  FINDINGS: Left lung base opacity is stable from the prior exam. Right lung base [place] may be mildly increased although the differences most likely positional. Lung base opacity may reflect infiltrate or atelectasis or a combination.  There is vascular congestion without overt pulmonary edema.  The cardiac silhouette is normal in size. The mediastinum is normal in contour.  Status post left mastectomy.  IMPRESSION: 1. Persistent left lung base opacity. This is consistent with pneumonia in the proper clinical setting. It may reflect atelectasis. 2. Right lung base opacity appears mildly increased from the previous day's study although this may be technical/positional and not a true change. This also could reflect atelectasis or infiltrate. Atelectasis is favored. 3. Vascular congestion without overt edema.   Electronically  Signed   By: Amie Portland M.D.   On: 09/26/2013 08:17    Positive for anxiety and bad mood Blood pressure 139/39, pulse 93, temperature 99.7 F (37.6 C), temperature source Core (Comment), resp. rate 28, height 5\' 4"  (1.626 m), weight 84.1 kg (185 lb 6.5 oz), SpO2 94.00%.   Assessment/Plan: Mood disorder NOS Patient has capacity to make her own medical decision and living arrangements Recommended no psychotropic medications Appreciate psych consultation and will sign off at this time  Adam Sanjuan,JANARDHAHA R. 09/26/2013, 4:07 PM

## 2013-09-26 NOTE — Progress Notes (Signed)
Patient refuses wound care and refuses to be turned.  Patient education reinforced concerning the likely consequences of not turning and receiving wound care. Patient spoke to her husband of three years over the telephone. Patient stated "My husband told me I should not get wound care".   Patient frequently "stares off" and doesn't respond to questions while doing so.

## 2013-09-26 NOTE — Progress Notes (Signed)
Patient has refused turning this shift.  Educated patient on importance of turning side to side due to already compromised skin integrity.  Patient will not allow me to look at wounds on buttocks or under leg wrap.  Encouraged patient to turn numerous times throughout the night, however, patient refused and slept with blanket over her head, stating "Don't bother me, let me sleep".  Will continue to monitor.

## 2013-09-26 NOTE — Progress Notes (Signed)
Patient refused to be turned / touched to assess skin appropriately. Patient refused to have sacrum examined or feet lower extremities assessed. Patient is alert and oriented. Patient received extensive education with viewing of pictures (r/t presure ucler staging and her potential for extensive damage from not turning) to emphasis the importance of turning left to right especially with sacral pressure ulcers present. Education/ viewing of picture did not change patient mind. Patient also refused to elevate legs on pillow to assist with swelling reduction. Rt toes swollen and continues to weep discharge. Will cont to monitor and encourage application of appropriate interventional measures.

## 2013-09-26 NOTE — Progress Notes (Addendum)
[facility] PROGRESS NOTE  [name] ZOX:[id number] DOB: 06/09/1951 DOA: 09/24/2013 PCP: Terald Sleeper, MD  Brief narrative: [name] is an 62 y.o. female with PMH of chronic lower extremity venous insufficiency/stasis ulcers, morbid obesity, chronic diastolic congestive heart failure with last echocardiogram done 08/12/2010 showing EF of 60-65% and grade 1 diastolic dysfunction was brought to the hospital from her nursing home via EMS where she was noted to have one episode of nonbloody green emesis and a temperature documented at 104.5. Upon initial evaluation by EMS, she was found to be hypoxic with oxygen saturations in the mid-to high 80s. Upon arrival in the ER, her room air saturation was 83%. Initial temperature was 105.7.  Initial workup in the emergency department does reveal chest x-ray findings consistent with healthcare associated pneumonia.  Assessment/Plan: Principal Problem:  Sepsis rule out healthcare associated pneumonia versus cellulitis as source with lactic acidosis  -Source of sepsis thought to be from healthcare associated pneumonia versus cellulitis. She has an abnormal chest x-ray and chronic lower extremity wounds, either of which may be the source of her sepsis. The hypoxia suggests that this is from healthcare associated pneumonia.  -Remains hemodynamically stable, fever continues - reculture today (urine and blood)  - repeat CXR shows continued presence of PNA  -Continue empiric aztreonam and vancomycin .  - urine legionella and strep pneumo ag pending - stopping IVF today. Encourage PO hydration  - per Dr Darnelle Catalan notes, psych has been consulted to address the patients mental capacity. Awaiting this eval as patient continues to refuse to allow evaluation of her B/L ankle wounds  Active Problems:  Acute hypoxic respiratory failure  -Continue supplemental oxygen and empiric antibiotics for healthcare associated pneumonia.  Hypertension  -Currently  hemodynamically stable. Continue metoprolol as blood pressure allows. IV Lasix OTO today  Hypothyroidism / Low TSH  -Continue Synthroid. TSH low. Check free T4.  Lower extremity venous stasis  -Wound care consult refused by patient -Psychiatric evaluation requested to determine competency. Seizure disorder  -Continue Depakote. Depakote level within normal limits at 56.1.  Elevated alkaline phosphatase level  -Trending down with hydration. Recheck in AM Uncontrolled Type 2 diabetes/Hyperglycemia  - controlled on home dose Lantus and SSI to resistant scale Q AC/HS. Chronic diastolic CHF (congestive heart failure)  - BNP grossly elevated since admission. Weight up 6lbs. Stopping IVF. Lasix 20mg  IV OTO today. reeval tomorrow   Normocytic anemia -stabilized   Code Status: DNR Family Communication: No family at bedside. Disposition Plan: SNF.   Medical Consultants:  Psychiatry.  Other Consultants:  Wound care refused   Anti-infectives:  Vancomycin 09/24/2013--->  Aztreonam 09/24/2013--->  HPI/Subjective: [name] states she is doing well. States having BMs, no abd pain, no dyspnea, no cough.  Asking when she can go home. No complaints this AM. When discussing case w/ her family member at bedside, wound  C/s was agreed upon.   Objective: Filed Vitals:   09/25/13 2000 09/25/13 2200 09/26/13 0000 09/26/13 0400  BP:  116/38 100/46 152/39  Pulse:  82 88 97  Temp:  99 F (37.2 C) 99.7 F (37.6 C) 101.1 F (38.4 C)  TempSrc: Core (Comment) Core (Comment) Core (Comment) Core (Comment)  Resp:  24 26 30   Height:      Weight:    185 lb 6.5 oz (84.1 kg)  SpO2:  99% 100% 100%    Intake/Output Summary (Last 24 hours) at 09/26/13 0723 Last data filed at 09/26/13 0600  Gross per 24  hour  Intake   2440 ml  Output    925 ml  Net   1515 ml    Exam: Gen:  Tremulous Cardiovascular:  Tachycardic, regular Respiratory:  Lungs diminished Gastrointestinal:  Abdomen soft, NT/ND,  + BS Extremities:  Venous stasis with lower extremity edema. Right lower extremity Unna boot intact  Data Reviewed: Basic Metabolic Panel:  Recent Labs Lab 09/24/13 1040 09/25/13 0310 09/26/13 0350  NA 135 135 135  K 4.2 3.9 3.7  CL 96 100 101  CO2 26 24 25   GLUCOSE 159* 118* 147*  BUN 16 17 15   CREATININE 0.66 0.69 0.51  CALCIUM 9.0 8.3* 8.9   GFR Estimated Creatinine Clearance: 74.6 ml/min (by C-G formula based on Cr of 0.51). Liver Function Tests:  Recent Labs Lab 09/24/13 1040 09/25/13 0310  AST 33 22  ALT 8 5  ALKPHOS 184* 131*  BILITOT 0.2* 0.2*  PROT 7.4 5.8*  ALBUMIN 2.3* 1.7*   CBC:  Recent Labs Lab 09/24/13 1040 09/25/13 0310 09/26/13 0350  WBC 24.6* 22.3* 23.4*  NEUTROABS 22.4*  --   --   HGB 12.9 10.6* 11.5*  HCT 39.3 32.2* 36.1  MCV 91.2 91.2 92.3  PLT 438* 379 354   Cardiac Enzymes:  Recent Labs Lab 09/24/13 1040  CKTOTAL 31   BNP (last 3 results)  Recent Labs  09/24/13 1040  PROBNP 684.6*   CBG:  Recent Labs Lab 09/25/13 0405 09/25/13 0743 09/25/13 1236 09/25/13 1752 09/25/13 2322  GLUCAP 104* 89 138* 120* 165*   Hgb A1c  Recent Labs  09/24/13 1040  HGBA1C 6.2*   Thyroid function studies  Recent Labs  09/24/13 1040  TSH 0.188*   Microbiology Recent Results (from the past 240 hour(s))  CULTURE, BLOOD (ROUTINE X 2)     Status: None   Collection Time    09/24/13 10:40 AM      Result Value Range Status   Specimen Description BLOOD RIGHT ANTECUBITAL   Final   Special Requests BOTTLES DRAWN AEROBIC AND ANAEROBIC 5CC   Final   Culture  Setup Time     Final   Value: 09/24/2013 14:45     Performed at [facility]   Culture     Final   Value:        BLOOD CULTURE RECEIVED NO GROWTH TO DATE CULTURE WILL BE HELD FOR 5 DAYS BEFORE ISSUING A FINAL NEGATIVE REPORT     Performed at [facility]   Report Status PENDING   Incomplete  CULTURE, BLOOD (ROUTINE X 2)     Status: None   Collection Time     09/24/13 10:42 AM      Result Value Range Status   Specimen Description BLOOD RIGHT FOREARM   Final   Special Requests BOTTLES DRAWN AEROBIC AND ANAEROBIC 3CC   Final   Culture  Setup Time     Final   Value: 09/24/2013 14:45     Performed at [facility]   Culture     Final   Value:        BLOOD CULTURE RECEIVED NO GROWTH TO DATE CULTURE WILL BE HELD FOR 5 DAYS BEFORE ISSUING A FINAL NEGATIVE REPORT     Performed at [facility]   Report Status PENDING   Incomplete  URINE CULTURE     Status: None   Collection Time    09/24/13 11:12 AM      Result Value Range Status   Specimen  Description URINE, CATHETERIZED   Final   Special Requests NONE   Final   Culture  Setup Time     Final   Value: 09/24/2013 14:43     Performed at [facility]   Colony Count     Final   Value: NO GROWTH     Performed at [facility]   Culture     Final   Value: NO GROWTH     Performed at [facility]   Report Status 09/25/2013 FINAL   Final  MRSA PCR SCREENING     Status: Abnormal   Collection Time    09/24/13  1:59 PM      Result Value Range Status   MRSA by PCR POSITIVE (*) NEGATIVE Final   Comment:            The GeneXpert MRSA Assay (FDA     approved for NASAL specimens     only), is one component of a     comprehensive MRSA colonization     surveillance program. It is not     intended to diagnose MRSA     infection nor to guide or     monitor treatment for     MRSA infections.     RESULT CALLED TO, READ BACK BY AND VERIFIED WITH:     STACY PHELPS,RN [id number] @ 1630 BY J SCOTTON     Procedures and Diagnostic Studies: Dg Chest Port 1 View  09/24/2013   CLINICAL DATA:  62 year old female hypertension, fever and tremor.  EXAM: PORTABLE CHEST - 1 VIEW  COMPARISON:  08/15/2010 and earlier.  FINDINGS: Portable AP semi upright view at 1048 hrs. Bibasilar hypoventilation, but improved compared to prior. There is confluent left lung base opacity. Postoperative  changes to the left axilla again noted. Normal cardiac size and mediastinal contours. Visualized tracheal air column is within normal limits. No pneumothorax or edema. No large effusion.  IMPRESSION: Confluent left lung base opacity suspicious for pneumonia in this setting.   Electronically Signed   By: Augusto Gamble M.D.   On: 09/24/2013 11:05    Scheduled Meds: . antiseptic oral rinse  15 mL Mouth Rinse BID  . aztreonam  2 g Intravenous Q8H  . baclofen  10 mg Oral TID  . Chlorhexidine Gluconate Cloth  6 each Topical Q0600  . divalproex  500 mg Oral BID  . enoxaparin (LOVENOX) injection  40 mg Subcutaneous Q24H  . insulin aspart  0-20 Units Subcutaneous TID WC  . insulin aspart  0-5 Units Subcutaneous QHS  . insulin glargine  45 Units Subcutaneous QHS  . levothyroxine  200 mcg Oral QAC breakfast  . metoprolol tartrate  25 mg Oral Daily  . multivitamin with minerals  1 tablet Oral Daily  . mupirocin ointment  1 application Nasal BID  . sodium chloride  3 mL Intravenous Q12H  . vancomycin  1,000 mg Intravenous Q12H   Continuous Infusions: . sodium chloride 75 mL/hr at 09/25/13 1405    Time spent: 37 minutes.  The patient is critically ill and requires complex decision-making.   LOS: 2 days   Alysia Penna  [facility] Pager [phone]  *Please note that the hospitalists switch teams on Wednesdays. Please call the flow manager at [phone] if you are having difficulty reaching the hospitalist taking care of this patient as she can update you and provide the most up-to-date pager number of provider caring for the patient. If 8PM-8AM, please contact night-coverage at  [email or web address], password [facility]  09/26/2013, 7:23 AM

## 2013-09-26 NOTE — Progress Notes (Signed)
Patient received education related to lovenox and the importance of receiving the sq injection in the love handles (either side) of the abdomen. Patient request it to be given in thigh otherwise refuses to rec medication. Currently patient has not received this medication.

## 2013-09-26 NOTE — Plan of Care (Signed)
Problem: Phase I Progression Outcomes Goal: Voiding-avoid urinary catheter unless indicated Outcome: Not Progressing Patient will not allow turning.  High risk for additional skin breakdown.

## 2013-09-26 NOTE — Progress Notes (Signed)
Patient continues to refuse repositioning of feet or turning.  Patient continues to refuse wound care.  Patient education has been reinforced concerning the consequences of not allowing turning.  Patient's buttock area is red and does not blanche.  Each side of upper buttock has small stage two pressure ulcers.  These problems were viewed the one time the patient allowed turning for leaking urinary catheter and incontinent stool.  Will continue to provide emotional support and encouragement.
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
## 2013-09-27 DIAGNOSIS — A419 Sepsis, unspecified organism: Principal | ICD-10-CM

## 2013-09-27 DIAGNOSIS — E039 Hypothyroidism, unspecified: Secondary | ICD-10-CM

## 2013-09-27 DIAGNOSIS — E870 Hyperosmolality and hypernatremia: Secondary | ICD-10-CM

## 2013-09-27 DIAGNOSIS — I872 Venous insufficiency (chronic) (peripheral): Secondary | ICD-10-CM

## 2013-09-27 LAB — LEGIONELLA ANTIGEN, URINE: Legionella Antigen, Urine: NEGATIVE

## 2013-09-27 LAB — GLUCOSE, CAPILLARY
Glucose-Capillary: 59 mg/dL — ABNORMAL LOW (ref 70–99)
Glucose-Capillary: 93 mg/dL (ref 70–99)
Glucose-Capillary: 100 mg/dL — ABNORMAL HIGH (ref 70–99)

## 2013-09-27 LAB — PRO B NATRIURETIC PEPTIDE: Pro B Natriuretic peptide (BNP): 3775 pg/mL — ABNORMAL HIGH (ref 0–125)

## 2013-09-27 LAB — COMPREHENSIVE METABOLIC PANEL
Alkaline Phosphatase: 217 U/L — ABNORMAL HIGH (ref 39–117)
BUN: 13 mg/dL (ref 6–23)
CO2: 24 mEq/L (ref 19–32)
Creatinine, Ser: 0.44 mg/dL — ABNORMAL LOW (ref 0.50–1.10)
GFR calc Af Amer: >90 mL/min (ref >90)
Glucose, Bld: 90 mg/dL (ref 70–99)
Potassium: 3.3 mEq/L — ABNORMAL LOW (ref 3.5–5.1)
Total Bilirubin: 0.1 mg/dL — ABNORMAL LOW (ref 0.3–1.2)
Total Protein: 5.9 g/dL — ABNORMAL LOW (ref 6.0–8.3)

## 2013-09-27 LAB — CBC
HCT: 33.6 % — ABNORMAL LOW (ref 36.0–46.0)
Hemoglobin: 10.7 g/dL — ABNORMAL LOW (ref 12.0–15.0)
MCH: 29.1 pg (ref 26.0–34.0)
MCHC: 31.8 g/dL (ref 30.0–36.0)
MCV: 91.3 fL (ref 78.0–100.0)
RBC: 3.68 MIL/uL — ABNORMAL LOW (ref 3.87–5.11)

## 2013-09-27 LAB — URINE CULTURE
Colony Count: NO GROWTH
Culture: NO GROWTH

## 2013-09-27 MED ORDER — FUROSEMIDE 20 MG PO TABS
20.0000 mg | ORAL_TABLET | Freq: Every day | ORAL | Status: DC
  Administered 2013-09-27: 20 mg via ORAL
  Filled 2013-09-27: qty 1

## 2013-09-27 MED ORDER — POTASSIUM CHLORIDE CRYS ER 20 MEQ PO TBCR
20.0000 meq | EXTENDED_RELEASE_TABLET | Freq: Every day | ORAL | Status: DC
  Administered 2013-09-27 – 2013-09-28 (×2): 20 meq via ORAL
  Filled 2013-09-27 (×3): qty 1

## 2013-09-27 MED ORDER — FUROSEMIDE 20 MG PO TABS
20.0000 mg | ORAL_TABLET | Freq: Every day | ORAL | Status: DC
Start: 2013-09-27 — End: 2013-09-29
  Administered 2013-09-27 – 2013-09-28 (×2): 20 mg via ORAL
  Filled 2013-09-27 (×3): qty 1

## 2013-09-27 NOTE — Progress Notes (Signed)
[facility] PROGRESS NOTE  [name] ZOX:[id number] DOB: [birth year] DOA: 09/24/2013 PCP: Terald Sleeper, MD  Brief narrative: [name] is an 62 y.o. female with PMH of chronic lower extremity venous insufficiency/stasis ulcers, morbid obesity, chronic diastolic congestive heart failure with last echocardiogram done 08/12/2010 showing EF of 60-65% and grade 1 diastolic dysfunction was brought to the hospital from her nursing home via EMS where she was noted to have one episode of nonbloody green emesis and a temperature documented at 104.5. Upon initial evaluation by EMS, she was found to be hypoxic with oxygen saturations in the mid-to high 80s. Upon arrival in the ER, her room air saturation was 83%. Initial temperature was 105.7.  Initial workup in the emergency department does reveal chest x-ray findings consistent with healthcare associated pneumonia.  Assessment/Plan: Principal Problem:  Sepsis rule out healthcare associated pneumonia versus cellulitis as source with lactic acidosis  -Source of sepsis thought to be from healthcare associated pneumonia versus cellulitis. She has an abnormal chest x-ray and chronic lower extremity wounds, either of which may be the source of her sepsis. The hypoxia suggests that this is from healthcare associated pneumonia.  - treating the PNA. Pt refusing treatment of the LE wounds  -On empiric aztreonam (PCN allergy) and vancomycin .  -Remains hemodynamically stable, fever curve improving but febrile in past 24 hours  - cultures remain NGTD  - repeat CXR 10/11 showed continued presence of PNA. May consider repeat on 10/13  - urine legionella and strep pneumo ag pending - stopped IVF 2/2 volume overload. Encourage PO hydration  - pt continually refuses wound checks of her feet and psych has deemed her mentally competent to make decisions, so I and the nursing staff continue to stress the importance of treating the LE wounds locally before the  consequences medically escalate but she continues to decline and we respect her wishes  Active Problems:  Acute hypoxic respiratory failure  -Continue supplemental oxygen and empiric antibiotics for healthcare associated pneumonia.  - [place] O2 as patient cooperates  Hypertension  -Currently hemodynamically stable. Continue metoprolol as blood pressure allows. S/p IV lasix on 10/11. Resuming home lasix today Hypothyroidism / Low TSH  -Continue Synthroid. TSH low.  free T4 normal Lower extremity venous stasis  -Wound care consult refused by patient -Psychiatric evaluation deemed patient mentally capable of decision making. Seizure disorder  -Continue Depakote. Depakote level within normal limits at 56.1.  Elevated alkaline phosphatase level  -remains 190-210 Uncontrolled Type 2 diabetes/Hyperglycemia  - controlled on home dose Lantus and SSI to resistant scale Q AC/HS. Chronic diastolic CHF (congestive heart failure)  - BNP grossly elevated since admission. Weight up 6lbs. Stopped IVF . S/p IV lasix on 10/11. Resuming home lasix today. Daily BNP   Normocytic anemia -stabilized   Code Status: DNR Family Communication: No family at bedside. Disposition Plan: SNF.   Medical Consultants:  Psychiatry.  Other Consultants:  Wound care refused   Anti-infectives:  Vancomycin 09/24/2013--->  Aztreonam 09/24/2013--->  HPI/Subjective: [name] has no complaints this AM   Objective: Filed Vitals:   09/26/13 2300 09/27/13 0400 09/27/13 0700 09/27/13 0827  BP: 131/38 103/37  146/56  Pulse:  79 80 83  Temp:  97 F (36.1 C) 97 F (36.1 C) 97.3 F (36.3 C)  TempSrc:      Resp:  13 19 19   Height:      Weight:  189 lb 2.5 oz (85.8 kg)    SpO2:  90% 96%  95%    Intake/Output Summary (Last 24 hours) at 09/27/13 0957 Last data filed at 09/27/13 0700  Gross per 24 hour  Intake   1230 ml  Output   1900 ml  Net   -670 ml    Exam: Gen:  Tremulous Cardiovascular:  Tachycardic,  regular Respiratory:  Lungs diminished Gastrointestinal:  Abdomen soft, NT/ND, + BS Extremities:  Venous stasis dermatitis with lower extremity edema. B/L lower extremity Unna boot intact. Purulent discharge from L foot from what can be seen on exam based on patient compliance  Data Reviewed: Basic Metabolic Panel:  Recent Labs Lab 09/24/13 1040 09/25/13 0310 09/26/13 0350 09/27/13 0340  NA 135 135 135 138  K 4.2 3.9 3.7 3.3*  CL 96 100 101 106  CO2 26 24 25 24   GLUCOSE 159* 118* 147* 90  BUN 16 17 15 13   CREATININE 0.66 0.69 0.51 0.44*  CALCIUM 9.0 8.3* 8.9 8.5   GFR Estimated Creatinine Clearance: 75.3 ml/min (by C-G formula based on Cr of 0.44). Liver Function Tests:  Recent Labs Lab 09/24/13 1040 09/25/13 0310 09/27/13 0340  AST 33 22 39*  ALT 8 5 11   ALKPHOS 184* 131* 217*  BILITOT 0.2* 0.2* 0.1*  PROT 7.4 5.8* 5.9*  ALBUMIN 2.3* 1.7* 1.6*   CBC:  Recent Labs Lab 09/24/13 1040 09/25/13 0310 09/26/13 0350 09/27/13 0340  WBC 24.6* 22.3* 23.4* 19.0*  NEUTROABS 22.4*  --   --   --   HGB 12.9 10.6* 11.5* 10.7*  HCT 39.3 32.2* 36.1 33.6*  MCV 91.2 91.2 92.3 91.3  PLT 438* 379 354 371   Cardiac Enzymes:  Recent Labs Lab 09/24/13 1040  CKTOTAL 31   BNP (last 3 results)  Recent Labs  09/24/13 1040 09/26/13 0350 09/27/13 0340  PROBNP 684.6* 3477.0* 3775.0*   CBG:  Recent Labs Lab 09/26/13 0824 09/26/13 1224 09/26/13 1610 09/26/13 1629 09/26/13 2110  GLUCAP 89 138* 65* 83 119*   Hgb A1c  Recent Labs  09/24/13 1040  HGBA1C 6.2*   Thyroid function studies  Recent Labs  09/24/13 1040  TSH 0.188*   Microbiology Recent Results (from the past 240 hour(s))  CULTURE, BLOOD (ROUTINE X 2)     Status: None   Collection Time    09/24/13 10:40 AM      Result Value Range Status   Specimen Description BLOOD RIGHT ANTECUBITAL   Final   Special Requests BOTTLES DRAWN AEROBIC AND ANAEROBIC 5CC   Final   Culture  Setup Time     Final    Value: 09/24/2013 14:45     Performed at [facility]   Culture     Final   Value:        BLOOD CULTURE RECEIVED NO GROWTH TO DATE CULTURE WILL BE HELD FOR 5 DAYS BEFORE ISSUING A FINAL NEGATIVE REPORT     Performed at [facility]   Report Status PENDING   Incomplete  CULTURE, BLOOD (ROUTINE X 2)     Status: None   Collection Time    09/24/13 10:42 AM      Result Value Range Status   Specimen Description BLOOD RIGHT FOREARM   Final   Special Requests BOTTLES DRAWN AEROBIC AND ANAEROBIC 3CC   Final   Culture  Setup Time     Final   Value: 09/24/2013 14:45     Performed at [facility]   Culture     Final   Value:  BLOOD CULTURE RECEIVED NO GROWTH TO DATE CULTURE WILL BE HELD FOR 5 DAYS BEFORE ISSUING A FINAL NEGATIVE REPORT     Performed at [facility]   Report Status PENDING   Incomplete  URINE CULTURE     Status: None   Collection Time    09/24/13 11:12 AM      Result Value Range Status   Specimen Description URINE, CATHETERIZED   Final   Special Requests NONE   Final   Culture  Setup Time     Final   Value: 09/24/2013 14:43     Performed at [facility] Count     Final   Value: NO GROWTH     Performed at [facility]   Culture     Final   Value: NO GROWTH     Performed at [facility]   Report Status 09/25/2013 FINAL   Final  MRSA PCR SCREENING     Status: Abnormal   Collection Time    09/24/13  1:59 PM      Result Value Range Status   MRSA by PCR POSITIVE (*) NEGATIVE Final   Comment:            The GeneXpert MRSA Assay (FDA     approved for NASAL specimens     only), is one component of a     comprehensive MRSA colonization     surveillance program. It is not     intended to diagnose MRSA     infection nor to guide or     monitor treatment for     MRSA infections.     RESULT CALLED TO, READ BACK BY AND VERIFIED WITH:     STACY PHELPS,RN [id number] @ 1630 BY J SCOTTON     Procedures and  Diagnostic Studies: Dg Chest Port 1 View  09/24/2013   CLINICAL DATA:  62 year old female hypertension, fever and tremor.  EXAM: PORTABLE CHEST - 1 VIEW  COMPARISON:  08/15/2010 and earlier.  FINDINGS: Portable AP semi upright view at 1048 hrs. Bibasilar hypoventilation, but improved compared to prior. There is confluent left lung base opacity. Postoperative changes to the left axilla again noted. Normal cardiac size and mediastinal contours. Visualized tracheal air column is within normal limits. No pneumothorax or edema. No large effusion.  IMPRESSION: Confluent left lung base opacity suspicious for pneumonia in this setting.   Electronically Signed   By: Augusto Gamble M.D.   On: 09/24/2013 11:05    Scheduled Meds: . antiseptic oral rinse  15 mL Mouth Rinse BID  . aztreonam  2 g Intravenous Q8H  . baclofen  10 mg Oral TID  . Chlorhexidine Gluconate Cloth  6 each Topical Q0600  . divalproex  500 mg Oral BID  . enoxaparin (LOVENOX) injection  40 mg Subcutaneous Q24H  . furosemide  20 mg Oral Daily  . insulin aspart  0-20 Units Subcutaneous TID WC  . insulin aspart  0-5 Units Subcutaneous QHS  . insulin glargine  45 Units Subcutaneous QHS  . levothyroxine  200 mcg Oral QAC breakfast  . metoprolol tartrate  25 mg Oral Daily  . multivitamin with minerals  1 tablet Oral Daily  . mupirocin ointment  1 application Nasal BID  . sodium chloride  3 mL Intravenous Q12H  . vancomycin  1,000 mg Intravenous Q12H   Continuous Infusions:    Time spent: 35 minutes    LOS: 3 days   [name]  [facility] Pager [phone]  *Please note that the hospitalists switch teams on Wednesdays. Please call the flow manager at [phone] if you are having difficulty reaching the hospitalist taking care of this patient as she can update you and provide the most up-to-date pager number of provider caring for the patient. If 8PM-8AM, please contact night-coverage at [email or web address], password  [facility]  09/27/2013, 9:57 AM

## 2013-09-27 NOTE — Progress Notes (Addendum)
Patient transferred from ICU, alert, denies pain, oriented patient to room/unit, unable to perform full skin assessment because patient refused turning and will not let me remove the heel protector boot to assess her feet, noticed +3 edema on BLE and right leg wrapped with [facility], left upper leg noted with redness and warm to touch. Re-educated patient on the importance of assessing skin/wound and know how to treat it appropriately, she stated she understands and wants to be left alone. Will continue to F/U with plan of care.

## 2013-09-27 NOTE — Clinical Social Work Psychosocial (Signed)
Clinical Social Work Department  BRIEF PSYCHOSOCIAL ASSESSMENT  Patient: [name]  Account Number: [id number]  Admit date: 09/24/13 Clinical Social Worker Sabino [name], MSW Date/Time: 09/27/2013 3:30 Referred by: Physician Date Referred:  Referred for   SNF Placement-returning  Other Referral:  Interview type: Patient  Other interview type: PSYCHOSOCIAL DATA  Living Status: [facility] Admitted from facility: [facility] Level of care: SNF Primary support name: [name] Primary support relationship to patient: Sister Degree of support available:  Strong and vested  CURRENT CONCERNS  Current Concerns   Post-Acute Placement   Other Concerns:  SOCIAL WORK ASSESSMENT / PLAN  CSW met with pt re: PT recommendation for SNF.   Pt lives  [place]  CSW explained placement process and answered questions.   Pt reports she is agreeable to returning to [facility]    CSW completed FL2 and sent clinicals to [place]    Assessment/plan status: Information/Referral to [facility]  Other assessment/ plan:  Information/referral to community resources:  SNF     PATIENT'S/FAMILY'S RESPONSE TO PLAN OF CARE:  Pt  reports she is agreeable to returning to [facility]       Pt verbalized understanding of placement process and appreciation for CSW assist.   Sabino [name], MSW [phone]

## 2013-09-27 NOTE — Progress Notes (Signed)
Patient has been reassessed times 3 times to rotate in bed, and to allow assessment of sacrum. Patient refuses. Patient rt foot is weeping purulent drainage , placed an vaseline/4x4 gauze under foot to absorb some of the drainage. Secretion have been causing foot to stick to sheet.

## 2013-09-27 NOTE — Progress Notes (Signed)
Pt continues to refuse turning for assessments and for pressure relief for the stage II ulcers on her buttocks. She also continues to refuse to have her legs assessed, moved, or elevated; though she denies any pain this am. Pt was reminded of the importance of turning to help prevent further breakdown of her skin, but she still refuses to be turned. Will continue to assess pt and to attempt to evaluate her skin.   Arta Bruce [facility] 09/27/2013

## 2013-09-27 NOTE — Progress Notes (Signed)
Patient is alert and oriented. Patient is currently on roomair but when falls asleep with drop to 87-88% on room air. Encourage to deep breath.

## 2013-09-27 NOTE — Progress Notes (Signed)
Report called to [facility] on 4th floor. Will transfer pt to RM [id number].   Arta Bruce [facility]

## 2013-09-27 NOTE — Progress Notes (Addendum)
Hypoglycemic Event  CBG: 55 at 0820  Treatment: 120cc apple juice first, then 4oz of whole milk  Symptoms: pale, shaking but pt does have essential tremors  Follow-up CBG: [id number] CBG Result:59  Possible Reasons for Event: poor po intake, lantus dose given last night  Comments/MD notified:  After first recheck gave pt 4oz of whole milk and then pt's breakfast tray arrived. Assisted pt with eating breakfast and then rechecked CBG at about 0945 and it was 93. Will continue to monitor pf for s/s of hypoglycemia.  Pt was very difficult in treating her hypoglycemia. She did not want to drink or eat any of the options available to her to help increase her blood sugar. It was explained multiple times that it was important for her to have oral intake to increase her blood sugar because it would last longer than medication would. Pt was stubborn and refusing at times, but after much encouragement, coaching, and educating pt finally drank a container of juice and later a half a carton of milk.   Arta Bruce [facility]  Remember to initiate Hypoglycemia Order Set & complete

## 2013-09-27 NOTE — Progress Notes (Signed)
ANTIBIOTIC CONSULT NOTE - FOLLOW UP  Pharmacy Consult for aztreonam and vancomycin Indication: sepsis; r/o HCAP vs cellulits  Allergies  Allergen Reactions  . Codeine   . Morphine And Related   . Penicillins     Patient Measurements: Height: 5\' 4"  (162.6 cm) Weight: 189 lb 2.5 oz (85.8 kg) IBW/kg (Calculated) : 54.7  Vital Signs: Temp: 97.3 F (36.3 C) (10/12 0827) BP: 146/56 mmHg (10/12 0827) Pulse Rate: 83 (10/12 0827) Intake/Output from previous day: 10/11 0701 - 10/12 0700 In: 1390 [P.O.:320; IV Piggyback:550] Out: 1900 [Urine:1900]  Labs:  Recent Labs  09/25/13 0310 09/26/13 0350 09/27/13 0340  WBC 22.3* 23.4* 19.0*  HGB 10.6* 11.5* 10.7*  PLT 379 354 371  CREATININE 0.69 0.51 0.44*   Estimated CrCl 13mL/min/72kg    Microbiology: 10/9 blood x2: ngtd 10/9 urine: NGF 10/9 MRSA PCR: POSITIVE 10/11 blood x2: collected 1011 urine: collected 10/11 S pneumo Ur Ag: negative 10/11 Legionella Ur Ag: in process  Antiinfectives: 10/9 >>aztreonam>> 10/9 >>vancomycin>>   Assessment: 62 y/o F admitted 10/9 with sepsis, r/o HCAP, r/o cellulitis. 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 Hypoxia is resolving and patient currently off O2 supplementation with a normalized respiratory rate.  To continue vancomycin and aztreonam with pharmacy dosing.  Today is day 4 vancomycin and aztreonam  Pt continues to spike fevers, Tm/24h 101.8  WBC have decreased slightly to 19K  SCr stable at 0.5, CrCl~ 70 ml/min  UOP good at 0.9 ml/kg/hr  Noted patient refusing to have her LE wounds assessed, also refusing to be turned  Goal of Therapy:  Eradication of infection Adjust antibiotic dosages for weight and renal function Vancomycin trough 15-20  Plan:  - continue aztreonam 2g IV q8h - continue vancomycin 1g IV q12h - vancomycin trough at steady state if indicated - follow-up clinical course, culture  results, renal function - follow-up antibiotic de-escalation and length of therapy  Thank you for the consult.  Tomi Bamberger, PharmD Clinical Pharmacist Pager: [phone] Pharmacy: [phone] 09/27/2013 11:06 AM

## 2013-09-28 LAB — COMPREHENSIVE METABOLIC PANEL
ALT: 12 U/L (ref 0–35)
AST: 36 U/L (ref 0–37)
Alkaline Phosphatase: 235 U/L — ABNORMAL HIGH (ref 39–117)
BUN: 14 mg/dL (ref 6–23)
CO2: 22 mEq/L (ref 19–32)
Chloride: 104 mEq/L (ref 96–112)
Creatinine, Ser: 0.42 mg/dL — ABNORMAL LOW (ref 0.50–1.10)
GFR calc Af Amer: >90 mL/min (ref >90)
GFR calc non Af Amer: >90 mL/min (ref >90)
Potassium: 4.1 mEq/L (ref 3.5–5.1)
Total Bilirubin: <0.1 mg/dL — ABNORMAL LOW (ref 0.3–1.2)

## 2013-09-28 LAB — CBC
HCT: 33.7 % — ABNORMAL LOW (ref 36.0–46.0)
MCV: 91.3 fL (ref 78.0–100.0)
Platelets: 364 10*3/uL (ref 150–400)
RBC: 3.69 MIL/uL — ABNORMAL LOW (ref 3.87–5.11)
WBC: 12.1 10*3/uL — ABNORMAL HIGH (ref 4.0–10.5)

## 2013-09-28 LAB — GLUCOSE, CAPILLARY

## 2013-09-28 MED ORDER — LEVOFLOXACIN 750 MG PO TABS
750.0000 mg | ORAL_TABLET | Freq: Every day | ORAL | Status: DC
  Administered 2013-09-28: 750 mg via ORAL
  Filled 2013-09-28 (×2): qty 1

## 2013-09-28 MED ORDER — LISINOPRIL 2.5 MG PO TABS
2.5000 mg | ORAL_TABLET | Freq: Every day | ORAL | Status: DC
  Administered 2013-09-28: 2.5 mg via ORAL
  Filled 2013-09-28 (×2): qty 1

## 2013-09-28 NOTE — Progress Notes (Signed)
Patient continue to refuse repositioning/turning, will on let us change/clean her after she had a bowel movement but unable to assess wound because the patient will not turn far enough and kept saying put me down. Will not let staff change or readjust heel protector boot. BLE +3 edema, redness around the  left thigh area. Will  Continue to F/U with plan of care.

## 2013-09-28 NOTE — Progress Notes (Signed)
[facility] PROGRESS NOTE  [name] ZOX:[id number] DOB: 07/13/1951 DOA: 09/24/2013 PCP: Terald Sleeper, MD  Brief narrative: [name] is an 62 y.o. female with PMH of chronic lower extremity venous insufficiency/stasis ulcers, morbid obesity, chronic diastolic congestive heart failure with last echocardiogram done 08/12/2010 showing EF of 60-65% and grade 1 diastolic dysfunction was brought to the hospital from her nursing home via EMS where she was noted to have one episode of nonbloody green emesis and a temperature documented at 104.5. Upon initial evaluation by EMS, she was found to be hypoxic with oxygen saturations in the mid-to high 80s. Upon arrival in the ER, her room air saturation was 83%. Initial temperature was 105.7.  Initial workup in the emergency department does reveal chest x-ray findings consistent with healthcare associated pneumonia.  Assessment/Plan: Principal Problem:  Sepsis rule out healthcare associated pneumonia versus cellulitis as source with lactic acidosis  -Source of sepsis thought to be from healthcare associated pneumonia versus cellulitis. She has an abnormal chest x-ray and chronic lower extremity wounds, either of which may be the source of her sepsis. The hypoxia suggests that this is from healthcare associated pneumonia.  - treating the PNA. Pt refusing treatment of the LE wounds  -On empiric aztreonam (PCN allergy) and vancomycin .  -Remains hemodynamically stable, fever curve improving but febrile in past 24 hours  - cultures remain NGTD  - repeat CXR 10/11 showed continued presence of PNA. May consider repeat on 10/13  - urine legionella and strep pneumo ag pending - stopped IVF 2/2 volume overload. Encourage PO hydration  - pt continually refuses wound checks of her feet and psych has deemed her mentally competent to make decisions, so I and the nursing staff continue to stress the importance of treating the LE wounds locally before the  consequences medically escalate but she continues to decline and we respect her wishes    Acute hypoxic respiratory failure  -Continue supplemental oxygen and empiric antibiotics for healthcare associated pneumonia.  - [place] O2 as patient cooperates   Hypertension  -Currently hemodynamically stable. Continue metoprolol as blood pressure allows. S/p IV lasix on 10/11. Resuming home lasix today  Hypothyroidism / Low TSH  -Continue Synthroid. TSH low.  free T4 normal  Lower extremity venous stasis  -Wound care consult refused by patient -Psychiatric evaluation deemed patient mentally capable of decision making.  Seizure disorder  -Continue Depakote. Depakote level within normal limits at 56.1.   Elevated alkaline phosphatase level  -remains 190-210  Uncontrolled Type 2 diabetes/Hyperglycemia  - controlled on home dose Lantus and SSI to resistant scale Q AC/HS.  Chronic diastolic CHF (congestive heart failure)  - BNP grossly elevated since admission. Weight up 6lbs. Stopped IVF . S/p IV lasix on 10/11. Resuming home lasix today. Daily BNP   Normocytic anemia -stabilized   Code Status: DNR Family Communication: No family at bedside. Disposition Plan: SNF in AM   Medical Consultants:  Psychiatry.  Other Consultants:  Wound care refused   Anti-infectives:  Vancomycin 09/24/2013--->  Aztreonam 09/24/2013--->  HPI/Subjective: [name] is asking to go back to SNF No SOB, no CP   Objective: Filed Vitals:   09/27/13 1143 09/27/13 2103 09/28/13 0551 09/28/13 0904  BP: 153/70 160/80 132/47 128/48  Pulse: 88 97 78 79  Temp: 98.6 F (37 C) 97.6 F (36.4 C) 98.2 F (36.8 C)   TempSrc:  Oral Oral   Resp: 25 19 18    Height:      Weight:  84.324 kg (185 lb 14.4 oz)   SpO2: 100% 94% 96%     Intake/Output Summary (Last 24 hours) at 09/28/13 1201 Last data filed at 09/28/13 1008  Gross per 24 hour  Intake    550 ml  Output   1200 ml  Net   -650 ml    Exam: Gen:   Tremulous Cardiovascular:  Tachycardic, regular Respiratory:  Lungs diminished Gastrointestinal:  Abdomen soft, NT/ND, + BS Extremities:  Venous stasis dermatitis with lower extremity edema. B/L lower extremity Unna boot intact. Purulent discharge from L foot from what can be seen on exam based on patient compliance  Data Reviewed: Basic Metabolic Panel:  Recent Labs Lab 09/24/13 1040 09/25/13 0310 09/26/13 0350 09/27/13 0340 09/28/13 0512  NA 135 135 135 138 138  K 4.2 3.9 3.7 3.3* 4.1  CL 96 100 101 106 104  CO2 26 24 25 24 22   GLUCOSE 159* 118* 147* 90 105*  BUN 16 17 15 13 14   CREATININE 0.66 0.69 0.51 0.44* 0.42*  CALCIUM 9.0 8.3* 8.9 8.5 8.6   GFR Estimated Creatinine Clearance: 74.6 ml/min (by C-G formula based on Cr of 0.42). Liver Function Tests:  Recent Labs Lab 09/24/13 1040 09/25/13 0310 09/27/13 0340 09/28/13 0512  AST 33 22 39* 36  ALT 8 5 11 12   ALKPHOS 184* 131* 217* 235*  BILITOT 0.2* 0.2* 0.1* <0.1*  PROT 7.4 5.8* 5.9* 5.7*  ALBUMIN 2.3* 1.7* 1.6* 1.5*   CBC:  Recent Labs Lab 09/24/13 1040 09/25/13 0310 09/26/13 0350 09/27/13 0340 09/28/13 0512  WBC 24.6* 22.3* 23.4* 19.0* 12.1*  NEUTROABS 22.4*  --   --   --   --   HGB 12.9 10.6* 11.5* 10.7* 10.6*  HCT 39.3 32.2* 36.1 33.6* 33.7*  MCV 91.2 91.2 92.3 91.3 91.3  PLT 438* 379 354 371 364   Cardiac Enzymes:  Recent Labs Lab 09/24/13 1040  CKTOTAL 31   BNP (last 3 results)  Recent Labs  09/26/13 0350 09/27/13 0340 09/28/13 0512  PROBNP 3477.0* 3775.0* 1335.0*   CBG:  Recent Labs Lab 09/27/13 0954 09/27/13 1143 09/27/13 1703 09/27/13 2101 09/28/13 0736  GLUCAP 93 100* 115* 100* 80   Hgb A1c No results found for this basename: HGBA1C,  in the last 72 hours Thyroid function studies No results found for this basename: TSH, T4TOTAL, FREET3, T3FREE, THYROIDAB,  in the last 72 hours Microbiology Recent Results (from the past 240 hour(s))  CULTURE, BLOOD (ROUTINE X 2)      Status: None   Collection Time    09/24/13 10:40 AM      Result Value Range Status   Specimen Description BLOOD RIGHT ANTECUBITAL   Final   Special Requests BOTTLES DRAWN AEROBIC AND ANAEROBIC 5CC   Final   Culture  Setup Time     Final   Value: 09/24/2013 14:45     Performed at [facility]   Culture     Final   Value:        BLOOD CULTURE RECEIVED NO GROWTH TO DATE CULTURE WILL BE HELD FOR 5 DAYS BEFORE ISSUING A FINAL NEGATIVE REPORT     Performed at [facility]   Report Status PENDING   Incomplete  CULTURE, BLOOD (ROUTINE X 2)     Status: None   Collection Time    09/24/13 10:42 AM      Result Value Range Status   Specimen Description BLOOD RIGHT FOREARM   Final   Special  Requests BOTTLES DRAWN AEROBIC AND ANAEROBIC 3CC   Final   Culture  Setup Time     Final   Value: 09/24/2013 14:45     Performed at [facility]   Culture     Final   Value:        BLOOD CULTURE RECEIVED NO GROWTH TO DATE CULTURE WILL BE HELD FOR 5 DAYS BEFORE ISSUING A FINAL NEGATIVE REPORT     Performed at [facility]   Report Status PENDING   Incomplete  URINE CULTURE     Status: None   Collection Time    09/24/13 11:12 AM      Result Value Range Status   Specimen Description URINE, CATHETERIZED   Final   Special Requests NONE   Final   Culture  Setup Time     Final   Value: 09/24/2013 14:43     Performed at [facility] Count     Final   Value: NO GROWTH     Performed at [facility]   Culture     Final   Value: NO GROWTH     Performed at [facility]   Report Status 09/25/2013 FINAL   Final  MRSA PCR SCREENING     Status: Abnormal   Collection Time    09/24/13  1:59 PM      Result Value Range Status   MRSA by PCR POSITIVE (*) NEGATIVE Final   Comment:            The GeneXpert MRSA Assay (FDA     approved for NASAL specimens     only), is one component of a     comprehensive MRSA colonization     surveillance program. It  is not     intended to diagnose MRSA     infection nor to guide or     monitor treatment for     MRSA infections.     RESULT CALLED TO, READ BACK BY AND VERIFIED WITH:     STACY PHELPS,RN [id number] @ 1630 BY J SCOTTON  CULTURE, BLOOD (ROUTINE X 2)     Status: None   Collection Time    09/26/13 12:20 PM      Result Value Range Status   Specimen Description BLOOD RIGHT ARM   Final   Special Requests BOTTLES DRAWN AEROBIC AND ANAEROBIC 2.5CC   Final   Culture  Setup Time     Final   Value: 09/26/2013 20:05     Performed at [facility]   Culture     Final   Value:        BLOOD CULTURE RECEIVED NO GROWTH TO DATE CULTURE WILL BE HELD FOR 5 DAYS BEFORE ISSUING A FINAL NEGATIVE REPORT     Performed at [facility]   Report Status PENDING   Incomplete  CULTURE, BLOOD (ROUTINE X 2)     Status: None   Collection Time    09/26/13 12:37 PM      Result Value Range Status   Specimen Description BLOOD RIGHT HAND   Final   Special Requests BOTTLES DRAWN AEROBIC AND ANAEROBIC 2.5CC   Final   Culture  Setup Time     Final   Value: 09/26/2013 20:05     Performed at [facility]   Culture     Final   Value:        BLOOD CULTURE RECEIVED NO GROWTH TO DATE  CULTURE WILL BE HELD FOR 5 DAYS BEFORE ISSUING A FINAL NEGATIVE REPORT     Performed at [facility]   Report Status PENDING   Incomplete  URINE CULTURE     Status: None   Collection Time    09/26/13  1:51 PM      Result Value Range Status   Specimen Description URINE, CATHETERIZED   Final   Special Requests NONE   Final   Culture  Setup Time     Final   Value: 09/26/2013 20:47     Performed at [facility]   Colony Count     Final   Value: NO GROWTH     Performed at [facility]   Culture     Final   Value: NO GROWTH     Performed at [facility]   Report Status 09/27/2013 FINAL   Final     Procedures and Diagnostic Studies: Dg Chest Port 1 View  09/24/2013   CLINICAL DATA:   62 year old female hypertension, fever and tremor.  EXAM: PORTABLE CHEST - 1 VIEW  COMPARISON:  08/15/2010 and earlier.  FINDINGS: Portable AP semi upright view at 1048 hrs. Bibasilar hypoventilation, but improved compared to prior. There is confluent left lung base opacity. Postoperative changes to the left axilla again noted. Normal cardiac size and mediastinal contours. Visualized tracheal air column is within normal limits. No pneumothorax or edema. No large effusion.  IMPRESSION: Confluent left lung base opacity suspicious for pneumonia in this setting.   Electronically Signed   By: Augusto Gamble M.D.   On: 09/24/2013 11:05    Scheduled Meds: . antiseptic oral rinse  15 mL Mouth Rinse BID  . baclofen  10 mg Oral TID  . Chlorhexidine Gluconate Cloth  6 each Topical Q0600  . divalproex  500 mg Oral BID  . enoxaparin (LOVENOX) injection  40 mg Subcutaneous Q24H  . furosemide  20 mg Oral QHS  . insulin aspart  0-20 Units Subcutaneous TID WC  . insulin aspart  0-5 Units Subcutaneous QHS  . insulin glargine  45 Units Subcutaneous QHS  . levothyroxine  200 mcg Oral QAC breakfast  . lisinopril  2.5 mg Oral Daily  . metoprolol tartrate  25 mg Oral Daily  . multivitamin with minerals  1 tablet Oral Daily  . mupirocin ointment  1 application Nasal BID  . potassium chloride  20 mEq Oral Daily  . sodium chloride  3 mL Intravenous Q12H   Continuous Infusions:    Time spent: 35 minutes    LOS: 4 days   Marlin Canary  [facility] Pager [phone]  *Please note that the hospitalists switch teams on Wednesdays. Please call the flow manager at [phone] if you are having difficulty reaching the hospitalist taking care of this patient as she can update you and provide the most up-to-date pager number of provider caring for the patient. If 8PM-8AM, please contact night-coverage at [email or web address], password [facility]  09/28/2013, 12:01 PM

## 2013-09-28 NOTE — Progress Notes (Signed)
Patient has been refusing turning this shift and has also refused to be bathed. Patient refuses to allow RN to take off heel protectors to assess wounds that seem to be weeping. Patient also refuses to elevate heels to reduce swelling in her legs. Patient once again educated on the importance of proper wound care and preventing further skin damage. Patient continues to refuse. Per psychiatry note, patient has been deemed competent to make all medical decisions for herself. Will continue to monitor and encourage patient.  Leeroy Cha

## 2013-09-28 NOTE — Progress Notes (Signed)
The patient's sister "Carney Bern" called the RN to see how the patient was doing. The password "sis" was used. The patient was very happy to know that her sister had called. Will continue to monitor the patient.

## 2013-09-28 NOTE — Progress Notes (Signed)
ANTIBIOTIC CONSULT NOTE - FOLLOW UP  Pharmacy Consult for Levaquin Indication: pna  Allergies  Allergen Reactions  . Codeine   . Morphine And Related   . Penicillins     Patient Measurements: Height: 5\' 4"  (162.6 cm) Weight: 185 lb 14.4 oz (84.324 kg) IBW/kg (Calculated) : 54.7  Vital Signs: Temp: 98.2 F (36.8 C) (10/13 0551) Temp src: Oral (10/13 0551) BP: 128/48 mmHg (10/13 0904) Pulse Rate: 79 (10/13 0904) Intake/Output from previous day: 10/12 0701 - 10/13 0700 In: 600 [P.O.:480; IV Piggyback:120] Out: 700 [Urine:700]  Labs:  Recent Labs  09/26/13 0350 09/27/13 0340 09/28/13 0512  WBC 23.4* 19.0* 12.1*  HGB 11.5* 10.7* 10.6*  PLT 354 371 364  CREATININE 0.51 0.44* 0.42*   Estimated CrCl 75 mL/min     Microbiology: 10/9 MRSA PCR: POSITIVE 10/9 blood x2: ngtd 10/9 urine: NGF 10/11 blood x2: ngtd 1011 urine: NGF 10/11 S pneumo Ur Ag: negative 10/11 Legionella Ur Ag: negative  Antiinfectives: 10/9 >>aztreonam>> 10/13 10/9 >>vancomycin>>  10/13 10/13 >> Levaquin >>   Assessment: 62 y/o F admitted 10/9 with sepsis, r/o HCAP, r/o cellulitis. 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 Hypoxia is resolving and patient currently off O2 supplementation with a normalized respiratory rate.  Pharmacy is asked to continue vancomycin and aztreonam on 10/9 and now switch to levaquin on 10/13  Day #5 vancomycin and aztreonam, changed to Levaquin today.  Afebrile  WBC improving, decreased to 12k  SCr stable at 0.5, CrCl~ 75 ml/min  Noted patient refusing to have her LE wounds assessed, also refusing to be turned  Goal of Therapy:  Appropriate abx dosing, eradication of infection.   Plan:   Levaquin 750mg  PO q24h   Follow-up clinical course, culture results, renal function  Follow-up antibiotic de-escalation and length of therapy  Thank you for the consult.  Lynann Beaver PharmD,  BCPS Pager [phone] 09/28/2013 12:25 PM

## 2013-09-29 ENCOUNTER — Other Ambulatory Visit: Payer: Self-pay | Admitting: *Deleted

## 2013-09-29 LAB — GLUCOSE, CAPILLARY
Glucose-Capillary: 48 mg/dL — ABNORMAL LOW (ref 70–99)
Glucose-Capillary: 80 mg/dL (ref 70–99)

## 2013-09-29 MED ORDER — TRAMADOL-ACETAMINOPHEN 37.5-325 MG PO TABS: 1.0000 | ORAL_TABLET | Freq: Four times a day (QID) | ORAL | Status: DC | PRN

## 2013-09-29 MED ORDER — INSULIN ASPART 100 UNIT/ML ~~LOC~~ SOLN: 0.0000 [IU] | Freq: Three times a day (TID) | SUBCUTANEOUS | Status: DC

## 2013-09-29 MED ORDER — BACLOFEN 10 MG PO TABS: 10.0000 mg | ORAL_TABLET | Freq: Three times a day (TID) | ORAL | Status: DC

## 2013-09-29 MED ORDER — INSULIN GLARGINE 100 UNIT/ML ~~LOC~~ SOLN
40.0000 [IU] | Freq: Every day | SUBCUTANEOUS | Status: DC
  Filled 2013-09-29: qty 0.4

## 2013-09-29 MED ORDER — INSULIN ASPART 100 UNIT/ML ~~LOC~~ SOLN: 0.0000 [IU] | Freq: Every day | SUBCUTANEOUS | Status: DC

## 2013-09-29 MED ORDER — TRAMADOL-ACETAMINOPHEN 37.5-325 MG PO TABS: ORAL_TABLET | ORAL | Status: DC

## 2013-09-29 MED ORDER — LISINOPRIL 2.5 MG PO TABS: 2.5000 mg | ORAL_TABLET | Freq: Every day | ORAL | Status: DC

## 2013-09-29 MED ORDER — ADULT MULTIVITAMIN W/MINERALS CH: 1.0000 | ORAL_TABLET | Freq: Every day | ORAL | Status: DC

## 2013-09-29 MED ORDER — LEVOFLOXACIN 750 MG PO TABS: 750.0000 mg | ORAL_TABLET | Freq: Every day | ORAL | Status: DC

## 2013-09-29 MED ORDER — INSULIN GLARGINE 100 UNIT/ML ~~LOC~~ SOLN: 40.0000 [IU] | Freq: Every day | SUBCUTANEOUS | Status: DC

## 2013-09-29 MED ORDER — GLUCERNA SHAKE PO LIQD: 237.0000 mL | Freq: Every day | ORAL | Status: DC | PRN

## 2013-09-29 NOTE — Progress Notes (Signed)
Patient refuses to allow writer to assess skin or edema status.  Patient is refusing to be repositioned or cleaned up.  Patient educated about importance of getting urine off skin due to breakdown.  Patient states " I know all about that and I do not want to be bothered." Will attempt at later time to change patient.

## 2013-09-29 NOTE — Progress Notes (Signed)
Patient refused for staff to reposition her every 2 hours. Patient even refused for staff to check for incontinence every 2 hours. Patient did allow staff to check for incontinence and clean patient once.

## 2013-09-29 NOTE — Progress Notes (Signed)
Hypoglycemic Event  CBG: 48  Treatment: 15 GM carbohydrate snack  Symptoms: None  Follow-up CBG: [id number] CBG Result:80  Possible Reasons for Event: Unknown  Comments/MD notified:Dr. Benjamine Mola notified.      [name] [place]  Remember to initiate Hypoglycemia Order Set & complete

## 2013-09-29 NOTE — Discharge Summary (Signed)
Physician Discharge Summary  [name] YQM:[id number] DOB: 12-12-1951 DOA: 09/24/2013  PCP: Terald Sleeper, MD  Admit date: 09/24/2013 Discharge date: 09/29/2013  Time spent: 35 minutes  Recommendations for Outpatient Follow-up:  1. Wound care at nursing home 2. Check blood sugar and adjust medications 3. Cbc, bmp 1 week 4. levaquin for 5 more days  Discharge Diagnoses:  Principal Problem:   Sepsis rule out healthcare associated pneumonia versus cellulitis as source with lactic acidosis Active Problems:   Hypothyroidism   Lower extremity venous stasis   Seizure disorder   Elevated alkaline phosphatase level   Hyperglycemia   Chronic diastolic CHF (congestive heart failure)   Hypertension   Uncontrolled type 2 diabetes mellitus with insulin therapy   Acute respiratory failure with hypoxia   Normocytic anemia   Discharge Condition: improved  Diet recommendation: carb modified  Filed Weights   09/26/13 0400 09/27/13 0400 09/28/13 0551  Weight: 84.1 kg (185 lb 6.5 oz) 85.8 kg (189 lb 2.5 oz) 84.324 kg (185 lb 14.4 oz)    History of present illness:  [name] is an 62 y.o. female with PMH of chronic lower extremity venous insufficiency/stasis ulcers, morbid obesity, chronic diastolic congestive heart failure with last echocardiogram done 08/12/2010 showing EF of 60-65% and grade 1 diastolic dysfunction was brought to the hospital from her nursing home via EMS where she was noted to have one episode of nonbloody green emesis and a temperature documented at 104.5. Her chronic lower extremity wounds are unchanged over baseline and her Unna boot gets changed twice weekly. Upon initial evaluation by EMS, she was found to be hypoxic with oxygen saturations in the mid-to high 80s. Upon arrival in the ER, her room air saturation was 83%. Initial temperature was 105.7. The patient specifically denies any dyspnea, cough, fevers, nausea/vomiting. She is completely oriented to the  date, place and herself. Initial workup in the emergency department does reveal chest x-ray findings consistent with healthcare associated pneumonia.   Hospital Course:  Sepsis rule out healthcare associated pneumonia versus cellulitis as source with lactic acidosis  -Source of sepsis thought to be from healthcare associated pneumonia versus cellulitis. She has an abnormal chest x-ray and chronic lower extremity wounds, either of which may be the source of her sepsis. The hypoxia suggests that this is from healthcare associated pneumonia.  - treating the PNA. Pt refusing treatment of the LE wounds  -On empiric aztreonam (PCN allergy) and vancomycin for 4 days then changed to PO abx .  -Remains hemodynamically stable, fever curve improving but febrile in past 24 hours  - cultures remain NGTD  - stopped IVF 2/2 volume overload. Encourage PO hydration  - pt continually refuses wound checks of her feet and psych has deemed her mentally competent to make decisions, so I and the nursing staff continue to stress the importance of treating the LE wounds locally before the consequences medically escalate but she continues to decline and we respect her wishes   Acute hypoxic respiratory failure  -off O2   Hypertension  -Currently hemodynamically stable. Continue metoprolol as blood pressure allows. S/p IV lasix on 10/11. Resuming home lasix  -ACE added  Hypothyroidism / Low TSH  -Continue Synthroid. TSH low. free T4 normal   Lower extremity venous stasis  -Wound care consult refused by patient  -Psychiatric evaluation deemed patient mentally capable of decision making.   Seizure disorder  -Continue Depakote. Depakote level within normal limits at 56.1.   Elevated alkaline phosphatase level  -  remains 190-210   Uncontrolled Type 2 diabetes/Hyperglycemia  - controlled on home dose Lantus (minor decrease as patient had episode of AM hypoglycemia) and SSI to resistant scale Q AC/HS.   Chronic  diastolic CHF (congestive heart failure)  - BNP grossly elevated since admission. Weight up 6lbs. Stopped IVF . S/p IV lasix on 10/11. Resuming home lasix today. Daily BNP   Normocytic anemia  -stabilized    Procedures:  none  Consultations:  psych  Discharge Exam: Filed Vitals:   09/29/13 0607  BP: 115/80  Pulse: 96  Temp: 97.8 F (36.6 C)  Resp: 18    General: pleasant/cooperative Cardiovascular: rrr Respiratory: decreased b/l  Discharge Instructions  Discharge Orders   Future Orders Complete By Expires   Diet Carb Modified  As directed    Discharge instructions  As directed    Comments:     Wound care for legs per nursing home- patient refused in hospital Cbc, bmp 1 week- patient refused in hospital   Increase activity slowly  As directed        Medication List    STOP taking these medications       insulin lispro 100 UNIT/ML injection  Commonly known as:  HUMALOG      TAKE these medications       acetaminophen 325 MG tablet  Commonly known as:  TYLENOL  Take 650 mg by mouth every 6 (six) hours as needed for pain (Pain).     baclofen 10 MG tablet  Commonly known as:  LIORESAL  Take 1 tablet (10 mg total) by mouth 3 (three) times daily.     calcium carbonate 500 MG chewable tablet  Commonly known as:  TUMS - dosed in mg elemental calcium  Chew 1 tablet by mouth daily as needed for heartburn (Pt request).     divalproex 500 MG DR tablet  Commonly known as:  DEPAKOTE  Take 500 mg by mouth 2 (two) times daily.     feeding supplement (GLUCERNA SHAKE) Liqd  Take 237 mLs by mouth daily as needed (poor meal intake; encourage supplement if pt consumes <50% of meal).     furosemide 20 MG tablet  Commonly known as:  LASIX  Take 20 mg by mouth daily. Takes in the evening     insulin aspart 100 UNIT/ML injection  Commonly known as:  novoLOG  Inject 0-20 Units into the skin 3 (three) times daily with meals.     insulin aspart 100 UNIT/ML injection   Commonly known as:  novoLOG  Inject 0-5 Units into the skin at bedtime.     insulin glargine 100 UNIT/ML injection  Commonly known as:  LANTUS  Inject 0.4 mLs (40 Units total) into the skin at bedtime.     levofloxacin 750 MG tablet  Commonly known as:  LEVAQUIN  Take 1 tablet (750 mg total) by mouth daily.     levothyroxine 200 MCG tablet  Commonly known as:  SYNTHROID, LEVOTHROID  Take 200 mcg by mouth daily.     lisinopril 2.5 MG tablet  Commonly known as:  PRINIVIL,ZESTRIL  Take 1 tablet (2.5 mg total) by mouth daily.     metFORMIN 500 MG tablet  Commonly known as:  GLUCOPHAGE  Take 500 mg by mouth 2 (two) times daily with a meal.     metoprolol tartrate 25 MG tablet  Commonly known as:  LOPRESSOR  Take 25 mg by mouth daily.     multivitamin with minerals Tabs tablet  Take 1 tablet by mouth daily.     promethazine 25 MG tablet  Commonly known as:  PHENERGAN  Take 25 mg by mouth every 4 (four) hours as needed for nausea (nausea).     saccharomyces boulardii 250 MG capsule  Commonly known as:  FLORASTOR  Take 250 mg by mouth daily as needed (diarrhea).     traMADol-acetaminophen 37.5-325 MG per tablet  Commonly known as:  ULTRACET  Take 1 tablet by mouth every 6 (six) hours as needed for pain (pain).       Allergies  Allergen Reactions  . Codeine   . Morphine And Related   . Penicillins       The results of significant diagnostics from this hospitalization (including imaging, microbiology, ancillary and laboratory) are listed below for reference.    Significant Diagnostic Studies: Dg Chest Port 1 View  09/26/2013   CLINICAL DATA:  Cough. History of pneumonia.  EXAM: PORTABLE CHEST - 1 VIEW  COMPARISON:  09/24/2013  FINDINGS: Left lung base opacity is stable from the prior exam. Right lung base [place] may be mildly increased although the differences most likely positional. Lung base opacity may reflect infiltrate or atelectasis or a combination.  There is  vascular congestion without overt pulmonary edema.  The cardiac silhouette is normal in size. The mediastinum is normal in contour.  Status post left mastectomy.  IMPRESSION: 1. Persistent left lung base opacity. This is consistent with pneumonia in the proper clinical setting. It may reflect atelectasis. 2. Right lung base opacity appears mildly increased from the previous day's study although this may be technical/positional and not a true change. This also could reflect atelectasis or infiltrate. Atelectasis is favored. 3. Vascular congestion without overt edema.   Electronically Signed   By: Amie Portland M.D.   On: 09/26/2013 08:17   Dg Chest Port 1 View  09/24/2013   CLINICAL DATA:  62 year old female hypertension, fever and tremor.  EXAM: PORTABLE CHEST - 1 VIEW  COMPARISON:  08/15/2010 and earlier.  FINDINGS: Portable AP semi upright view at 1048 hrs. Bibasilar hypoventilation, but improved compared to prior. There is confluent left lung base opacity. Postoperative changes to the left axilla again noted. Normal cardiac size and mediastinal contours. Visualized tracheal air column is within normal limits. No pneumothorax or edema. No large effusion.  IMPRESSION: Confluent left lung base opacity suspicious for pneumonia in this setting.   Electronically Signed   By: Augusto Gamble M.D.   On: 09/24/2013 11:05    Microbiology: Recent Results (from the past 240 hour(s))  CULTURE, BLOOD (ROUTINE X 2)     Status: None   Collection Time    09/24/13 10:40 AM      Result Value Range Status   Specimen Description BLOOD RIGHT ANTECUBITAL   Final   Special Requests BOTTLES DRAWN AEROBIC AND ANAEROBIC 5CC   Final   Culture  Setup Time     Final   Value: 09/24/2013 14:45     Performed at [facility]   Culture     Final   Value:        BLOOD CULTURE RECEIVED NO GROWTH TO DATE CULTURE WILL BE HELD FOR 5 DAYS BEFORE ISSUING A FINAL NEGATIVE REPORT     Performed at [facility]   Report Status  PENDING   Incomplete  CULTURE, BLOOD (ROUTINE X 2)     Status: None   Collection Time    09/24/13 10:42 AM  Result Value Range Status   Specimen Description BLOOD RIGHT FOREARM   Final   Special Requests BOTTLES DRAWN AEROBIC AND ANAEROBIC 3CC   Final   Culture  Setup Time     Final   Value: 09/24/2013 14:45     Performed at [facility]   Culture     Final   Value:        BLOOD CULTURE RECEIVED NO GROWTH TO DATE CULTURE WILL BE HELD FOR 5 DAYS BEFORE ISSUING A FINAL NEGATIVE REPORT     Performed at [facility]   Report Status PENDING   Incomplete  URINE CULTURE     Status: None   Collection Time    09/24/13 11:12 AM      Result Value Range Status   Specimen Description URINE, CATHETERIZED   Final   Special Requests NONE   Final   Culture  Setup Time     Final   Value: 09/24/2013 14:43     Performed at [facility] Count     Final   Value: NO GROWTH     Performed at [facility]   Culture     Final   Value: NO GROWTH     Performed at [facility]   Report Status 09/25/2013 FINAL   Final  MRSA PCR SCREENING     Status: Abnormal   Collection Time    09/24/13  1:59 PM      Result Value Range Status   MRSA by PCR POSITIVE (*) NEGATIVE Final   Comment:            The GeneXpert MRSA Assay (FDA     approved for NASAL specimens     only), is one component of a     comprehensive MRSA colonization     surveillance program. It is not     intended to diagnose MRSA     infection nor to guide or     monitor treatment for     MRSA infections.     RESULT CALLED TO, READ BACK BY AND VERIFIED WITH:     STACY PHELPS,RN [id number] @ 1630 BY J SCOTTON  CULTURE, BLOOD (ROUTINE X 2)     Status: None   Collection Time    09/26/13 12:20 PM      Result Value Range Status   Specimen Description BLOOD RIGHT ARM   Final   Special Requests BOTTLES DRAWN AEROBIC AND ANAEROBIC 2.5CC   Final   Culture  Setup Time     Final   Value: 09/26/2013  20:05     Performed at [facility]   Culture     Final   Value:        BLOOD CULTURE RECEIVED NO GROWTH TO DATE CULTURE WILL BE HELD FOR 5 DAYS BEFORE ISSUING A FINAL NEGATIVE REPORT     Performed at [facility]   Report Status PENDING   Incomplete  CULTURE, BLOOD (ROUTINE X 2)     Status: None   Collection Time    09/26/13 12:37 PM      Result Value Range Status   Specimen Description BLOOD RIGHT HAND   Final   Special Requests BOTTLES DRAWN AEROBIC AND ANAEROBIC 2.5CC   Final   Culture  Setup Time     Final   Value: 09/26/2013 20:05     Performed at [facility]   Culture     Final  Value:        BLOOD CULTURE RECEIVED NO GROWTH TO DATE CULTURE WILL BE HELD FOR 5 DAYS BEFORE ISSUING A FINAL NEGATIVE REPORT     Performed at [facility]   Report Status PENDING   Incomplete  URINE CULTURE     Status: None   Collection Time    09/26/13  1:51 PM      Result Value Range Status   Specimen Description URINE, CATHETERIZED   Final   Special Requests NONE   Final   Culture  Setup Time     Final   Value: 09/26/2013 20:47     Performed at [facility] Count     Final   Value: NO GROWTH     Performed at [facility]   Culture     Final   Value: NO GROWTH     Performed at [facility]   Report Status 09/27/2013 FINAL   Final     Labs: Basic Metabolic Panel:  Recent Labs Lab 09/24/13 1040 09/25/13 0310 09/26/13 0350 09/27/13 0340 09/28/13 0512  NA 135 135 135 138 138  K 4.2 3.9 3.7 3.3* 4.1  CL 96 100 101 106 104  CO2 26 24 25 24 22   GLUCOSE 159* 118* 147* 90 105*  BUN 16 17 15 13 14   CREATININE 0.66 0.69 0.51 0.44* 0.42*  CALCIUM 9.0 8.3* 8.9 8.5 8.6   Liver Function Tests:  Recent Labs Lab 09/24/13 1040 09/25/13 0310 09/27/13 0340 09/28/13 0512  AST 33 22 39* 36  ALT 8 5 11 12   ALKPHOS 184* 131* 217* 235*  BILITOT 0.2* 0.2* 0.1* <0.1*  PROT 7.4 5.8* 5.9* 5.7*  ALBUMIN 2.3* 1.7* 1.6* 1.5*    No results found for this basename: LIPASE, AMYLASE,  in the last 168 hours No results found for this basename: AMMONIA,  in the last 168 hours CBC:  Recent Labs Lab 09/24/13 1040 09/25/13 0310 09/26/13 0350 09/27/13 0340 09/28/13 0512  WBC 24.6* 22.3* 23.4* 19.0* 12.1*  NEUTROABS 22.4*  --   --   --   --   HGB 12.9 10.6* 11.5* 10.7* 10.6*  HCT 39.3 32.2* 36.1 33.6* 33.7*  MCV 91.2 91.2 92.3 91.3 91.3  PLT 438* 379 354 371 364   Cardiac Enzymes:  Recent Labs Lab 09/24/13 1040  CKTOTAL 31   BNP: BNP (last 3 results)  Recent Labs  09/26/13 0350 09/27/13 0340 09/28/13 0512  PROBNP 3477.0* 3775.0* 1335.0*   CBG:  Recent Labs Lab 09/28/13 0736 09/28/13 1206 09/28/13 1643 09/28/13 2110 09/29/13 0752  GLUCAP 80 80 94 97 48*       Signed:  Torrell Krutz  [facility] 09/29/2013, 8:00 AM

## 2013-09-29 NOTE — Progress Notes (Signed)
Patient is set to discharge back to [facility] today. Patient aware. Discharge packet in [place] - RN, Abie aware. PTAR scheduled for 10:30a pickup (Service Request Id: [id number]).   Unice Bailey, LCSW [facility] Clinical Social Worker cell #: [phone]

## 2013-09-29 NOTE — Progress Notes (Signed)
Patient refused all morning labs. PCP on call was notiifed

## 2013-09-29 NOTE — Progress Notes (Signed)
Patient refused to have daily weight checked. Will continue to monitor pt.

## 2013-09-30 LAB — CULTURE, BLOOD (ROUTINE X 2)
Culture: NO GROWTH
Culture: NO GROWTH

## 2013-10-02 ENCOUNTER — Non-Acute Institutional Stay (SKILLED_NURSING_FACILITY): Payer: Medicaid Other | Admitting: Adult Health

## 2013-10-02 DIAGNOSIS — E039 Hypothyroidism, unspecified: Secondary | ICD-10-CM

## 2013-10-02 DIAGNOSIS — G8929 Other chronic pain: Secondary | ICD-10-CM

## 2013-10-02 DIAGNOSIS — E1310 Other specified diabetes mellitus with ketoacidosis without coma: Secondary | ICD-10-CM

## 2013-10-02 DIAGNOSIS — I5032 Chronic diastolic (congestive) heart failure: Secondary | ICD-10-CM

## 2013-10-02 DIAGNOSIS — I509 Heart failure, unspecified: Secondary | ICD-10-CM

## 2013-10-02 DIAGNOSIS — J189 Pneumonia, unspecified organism: Secondary | ICD-10-CM

## 2013-10-02 DIAGNOSIS — G40909 Epilepsy, unspecified, not intractable, without status epilepticus: Secondary | ICD-10-CM

## 2013-10-02 DIAGNOSIS — E1110 Type 2 diabetes mellitus with ketoacidosis without coma: Secondary | ICD-10-CM

## 2013-10-02 DIAGNOSIS — I10 Essential (primary) hypertension: Secondary | ICD-10-CM

## 2013-10-02 LAB — CULTURE, BLOOD (ROUTINE X 2): Culture: NO GROWTH

## 2013-10-05 ENCOUNTER — Encounter: Payer: Self-pay | Admitting: Adult Health

## 2013-10-05 DIAGNOSIS — G8929 Other chronic pain: Secondary | ICD-10-CM | POA: Insufficient documentation

## 2013-10-05 NOTE — Progress Notes (Signed)
Patient ID: [name], female   DOB: March 11, 1951, 62 y.o.   MRN: [id number]  [place]  Allergies  Allergen Reactions  . Codeine   . Morphine And Related   . Penicillins      Chief Complaint  Patient presents with  . Hospitalization Follow-up    HPI: She is a long term resident of this facility who spiked a sudden fever with chills and was hospitalized for pneumonia. She is presently on levaquin 750 mg daily through 10-06-13. She is not voicing complaints at this time. She is not complaining of any shortness of breath or coughing at this time.    Past Medical History  Diagnosis Date  . Hypertension   . Thyroid disease     Hypothyroidism  . CHF (congestive heart failure)     Status post 2-D echocardiogram 08/12/2010, EF 60-65%, grade 1 diastolic dysfunction  . Epilepsy   . Venous insufficiency   . Morbid obesity   . Venous stasis ulcers   . Essential tremor     Past Surgical History  Procedure Laterality Date  . Bilateral cataract surgery with lens implants    . Right total hip replacement    . Left hip hemiarthroplasty    . Right knee open reduction and internal fixation of patella fracture    . Abdominal hysterectomy      VITAL SIGNS BP 116/62  Pulse 63  Ht 5\' 4"  (1.626 m)  Wt 182 lb (82.555 kg)  BMI 31.22 kg/m2   Patient's Medications  New Prescriptions   No medications on file  Previous Medications   ACETAMINOPHEN (TYLENOL) 325 MG TABLET    Take 650 mg by mouth every 6 (six) hours as needed for pain (Pain).   BACLOFEN (LIORESAL) 10 MG TABLET    Take 1 tablet (10 mg total) by mouth 3 (three) times daily.   CALCIUM CARBONATE (TUMS - DOSED IN MG ELEMENTAL CALCIUM) 500 MG CHEWABLE TABLET    Chew 1 tablet by mouth daily as needed for heartburn (Pt request).   DIVALPROEX (DEPAKOTE) 500 MG DR TABLET    Take 500 mg by mouth 2 (two) times daily.   FEEDING SUPPLEMENT, GLUCERNA SHAKE, (GLUCERNA SHAKE) LIQD    Take 237 mLs by mouth daily as needed (poor meal intake;  encourage supplement if pt consumes <50% of meal).   FUROSEMIDE (LASIX) 20 MG TABLET    Take 20 mg by mouth daily. Takes in the evening   INSULIN GLARGINE (LANTUS) 100 UNIT/ML INJECTION    Inject 0.4 mLs (40 Units total) into the skin at bedtime.   LEVOFLOXACIN (LEVAQUIN) 750 MG TABLET    Take 1 tablet (750 mg total) by mouth daily.   LEVOTHYROXINE (SYNTHROID, LEVOTHROID) 200 MCG TABLET    Take 200 mcg by mouth daily.   LISINOPRIL (PRINIVIL,ZESTRIL) 2.5 MG TABLET    Take 1 tablet (2.5 mg total) by mouth daily.   METFORMIN (GLUCOPHAGE) 500 MG TABLET    Take 500 mg by mouth 2 (two) times daily with a meal.   METOPROLOL TARTRATE (LOPRESSOR) 25 MG TABLET    Take 25 mg by mouth daily.   MULTIPLE VITAMIN (MULTIVITAMIN WITH MINERALS) TABS TABLET    Take 1 tablet by mouth daily.   PROMETHAZINE (PHENERGAN) 25 MG TABLET    Take 25 mg by mouth every 4 (four) hours as needed for nausea (nausea).   SACCHAROMYCES BOULARDII (FLORASTOR) 250 MG CAPSULE    Take 250 mg by mouth daily as needed (diarrhea).  TRAMADOL-ACETAMINOPHEN (ULTRACET) 37.5-325 MG PER TABLET    Take one tablet by mouth every 6 hours as needed for pain  Modified Medications   Modified Medication Previous Medication   INSULIN ASPART (NOVOLOG) 100 UNIT/ML INJECTION insulin aspart (NOVOLOG) 100 UNIT/ML injection      Inject 10 Units into the skin 3 (three) times daily with meals.    Inject 0-20 Units into the skin 3 (three) times daily with meals.   INSULIN ASPART (NOVOLOG) 100 UNIT/ML INJECTION insulin aspart (NOVOLOG) 100 UNIT/ML injection      Inject 6 Units into the skin at bedtime.    Inject 0-5 Units into the skin at bedtime.  Discontinued Medications   No medications on file    SIGNIFICANT DIAGNOSTIC EXAMS  09-24-13: chest x-ray: Confluent left lung base opacity suspicious for pneumonia in this setting.   09-26-13: chest x-ray: 1. Persistent left lung base opacity. This is consistent with pneumonia in the proper clinical setting. It  may reflect atelectasis. 2. Right lung base opacity appears mildly increased from the previous day's study although this may be technical/positional and not a true change. This also could reflect atelectasis or infiltrate. Atelectasis is favored.3. Vascular congestion without overt edema.    LABS REVIEWED: SHE WILL DECLINE LABS  MOST TIMES.   07-21-13: wbc 6.6 ;hgb 12.3; hct 37.8; mcv 91.5; plt 388; glucose 39; bun 13; creat 0.46; k+ 6.4; na++137 alk phos 123; albumin 3.0; hgb a1c 10.0; depakote 81.7  09-24-13: tsh 0.188; bnp 684.6;  09-25-13: free t4: 1.58; hgb a1c 6.2; urine culture: no growth  09-28-13: wbc 12.1 hgb 10.6; hct 33.7; 91.3; plt 364; glucose 105; bun 14; creat 0.42; k+4.1; na++138; alk phos 135 t.bili <0.1; bnp: 1335.0   Review of Systems  Constitutional: Positive for malaise/fatigue. Negative for fever and chills.  Respiratory: Negative for shortness of breath and wheezing.   Cardiovascular: Positive for leg swelling. Negative for chest pain and palpitations.       Her swelling is chronic in nature   Gastrointestinal: Negative for heartburn, abdominal pain and constipation.  Musculoskeletal: Negative for back pain and myalgias.  Skin:       Has unna boot to the right lower extremity. Has dry skin   Neurological: Negative for headaches.  Psychiatric/Behavioral: Negative for depression. The patient does not have insomnia.     Physical Exam  Constitutional: She is oriented to person, place, and time.  frail  Neck: Neck supple. No JVD present.  Cardiovascular: Normal rate, regular rhythm and intact distal pulses.   Respiratory: Effort normal. No respiratory distress. She has no wheezes.  Breath sounds diminished  GI: Soft. Bowel sounds are normal. She exhibits no distension. There is no tenderness.  Musculoskeletal: She exhibits edema.  Has bilateral resting tremor present. Has full rang of motion to her upper extremities; unable to move her lower extremities.    Neurological: She is alert and oriented to person, place, and time.  Skin: Skin is warm and dry.  Has chronic lymph edema present. Has dry skin to her lower extremities; has unna boot to her right lower extremity her right toes has slightly red without warmth or open areas present.        ASSESSMENT/ PLAN:   1. Pneumonia: will have her complete her abt and will continue to monitor her status  2. Diabetes: will continue her novolog 10 units with meals 5 units at hs; 40 units lantus daily and metformin 500 mg twice daily and will continue to  monitor her status   3. Chronic heart failure: will continue her lasix 20 mg daily and will monitor her status   4. Hypertension: is well managed; will continue her lisinopril 2.5 mg daily and lopressor 25 mg daily and will continue to monitor her status   5. Seizure disorder: will continue her depakote 500 mg twice daily; there are no reports of seizure activity present.   6. Chronic pain: will continue her baclofen 10 mg three times daily for her spasticity in her lower extremities and will continue her ultracet every 6 hours as needed and will monitor   7. Hypothyroidism: her tsh is slightly low with a normal free t4; will continue synthroid 200 mcg daily and will monitor her status.   Time spent with patient 50 minutes.

## 2013-10-06 ENCOUNTER — Non-Acute Institutional Stay (SKILLED_NURSING_FACILITY): Payer: Medicaid Other | Admitting: Internal Medicine

## 2013-10-06 DIAGNOSIS — J189 Pneumonia, unspecified organism: Secondary | ICD-10-CM

## 2013-10-06 DIAGNOSIS — I872 Venous insufficiency (chronic) (peripheral): Secondary | ICD-10-CM

## 2013-10-06 DIAGNOSIS — E1129 Type 2 diabetes mellitus with other diabetic kidney complication: Secondary | ICD-10-CM

## 2013-10-06 DIAGNOSIS — I10 Essential (primary) hypertension: Secondary | ICD-10-CM

## 2013-10-06 NOTE — Progress Notes (Signed)
Patient ID: [name], female   DOB: 22-Jan-1951, 62 y.o.   MRN: [id number]  Facility; [facility] Chief complaint; admission to SNF post admit to [facility] from 10/9-10/14. This represents a readmission to the facility   History; this is a 62 year old woman with a past medical history of diabetes with noncompliance. She was previously admitted to hospital in June with a hyperosmolar nonketotic state with a blood glucose over thousand. Since then she appears to be marginally compliant with her diabetic regimen. She was admitted on this occasion with fever to a temperature of over 104. On arrival to the ER her temperature was 105.7. She was thought to have health care associated pneumonia  colitis. She was empirically treated with vancomycin and history and am and then changed to by mouth antibiotics. She received IV fluid the. Her cultures remained negative. She was noncompliant with wound care in the hospital.  Past Medical History  Diagnosis Date  . Hypertension   . Thyroid disease     Hypothyroidism  . CHF (congestive heart failure)     Status post 2-D echocardiogram 08/12/2010, EF 60-65%, grade 1 diastolic dysfunction  . Epilepsy   . Venous insufficiency   . Morbid obesity   . Venous stasis ulcers   . Essential tremor    Past Surgical History  Procedure Laterality Date  . Bilateral cataract surgery with lens implants    . Right total hip replacement    . Left hip hemiarthroplasty    . Right knee open reduction and internal fixation of patella fracture    . Abdominal hysterectomy     Discharge medications; baclofen 10 mg 3 times a day, calcium carbonate 500 daily, Depakote 500 twice a day, Lasix 20 daily, NovoLog sliding scale a.c. meals, Lantus 40 units at bedtime, Levaquin 750 mg a day for 7 days which she apparently refused a, Synthroid 200 mcg daily, lisinopril 2.5 daily, metformin 500 twice a day, Lopressor 25 a day, florist store 250 mg by mouth daily when  necessary  Social; patient is a long-standing resident of this facility. Previously was at a local assisted living. She has a DO NOT RESUSCITATE in place. She is almost constantly bedbound as far as I am aware although the nurses tell me she occasionally will get up in a chair. She has a history of significant medication noncompliance, noncompliance with wound care.  Review of systems Respiratory; she denies cough shortness of breath Cardiac no chest pain GI no dysphagia, abdominal pain or diarrhea Skin new wounds over the right medial malleolus and right heel  Pysical General; patient is lying in bed in no distress. Vitals O2 sat 96% on room air respirations 20 pulse rate 96 Respiratory; completely clear entry bilaterally with no crackles or wheezes worker breathing is normal. Cardiac heart sounds are normal no murmurs. She appears to be euvolemic. Abdomen; no liver no spleen no masses. Abdomen is slightly distended however bowel sounds are positive Extremities; severe bilateral venous stasis which is chronic. There is no wounds on the left leg however the edema certainly makes this a possibility. On the right leg she is wrapped with a Kerlix and Coban wrap removing this shows a small wound over the medial malleolus however the surrounding tissue appears threatened to. She has open stage II wounds over the right heel with surrounding macerated skin.  Impression/plan #1 healthcare acquired pneumonia she refused her Levaquin on her return to the facility however she does not appear to be unstable. Her respiratory  exam is completely clear #2 type 2 diabetes with multiple complications. Poorly controlled. She was admitted with hyperosmolar coma in June last hemoglobin A1c in August was 10. As noted reviewing the [facility] from before she went to the hospital she refused her a.c. meals short acting insulin most of the time and her metformin. #3 severe bilateral venous stasis with secondary lymphedema.  This is a long-standing problem. She now has wounds on the right lateral malleolus and right heel with surrounding macerated skin. These will be dressed with sore throat Sinead and change daily if she will be compliant with this.  #4 history of grade 1 diastolic dysfunction; there is no evidence of this at the bedside. #5 severe essential tremor involving predominantly her arms but also her legs up. This does not seem to. Be particularly bothersome to the patient however if she were to complain I think this would justify pharmacologic attempts at therapy 6 hypertension in the setting of type 2 diabetes and some degree of mild chronic renal failure. Her ACE inhibitor should be pushed to the maximum that her blood pressure will tolerate certainly an attempt to get her systolic pressure in the 120-125 range would be preferable however she may not allow this. Furthermore her refusals of phlebotomy also make this somewhat of a concern. #7 medication and other issue noncompliance. She was previously not a lot of aspirin which is certainly indicated in this case. As noted her compliance with her diabetic regimen is marginal

## 2013-10-07 NOTE — Progress Notes (Signed)
Patient ID: [name], female   DOB: 04-May-1951, 62 y.o.   MRN: [id number]  [place]  Allergies  Allergen Reactions  . Codeine   . Morphine And Related   . Penicillins     Chief Complaint  Patient presents with  . Acute Visit    patent concerns    HPI  Staff has asked me to see her for her tremors. She has worsening tremors present in her upper extremities with the right worse than the left. She does have right hand weakness present as well. She states that she is having increased difficulty feeding herself due to her tremors.   Past Medical History  Diagnosis Date  . Hypertension   . Thyroid disease     Hypothyroidism  . CHF (congestive heart failure)     Status post 2-D echocardiogram 08/12/2010, EF 60-65%, grade 1 diastolic dysfunction  . Epilepsy   . Venous insufficiency   . Morbid obesity   . Venous stasis ulcers   . Essential tremor    Past Surgical History  Procedure Laterality Date  . Bilateral cataract surgery with lens implants    . Right total hip replacement    . Left hip hemiarthroplasty    . Right knee open reduction and internal fixation of patella fracture    . Abdominal hysterectomy      Filed Vitals:   07/03/13 1324  BP: 110/60  Pulse: 90  Height: 5\' 2"  (1.575 m)  Weight: 182 lb (82.555 kg)     LABS REVIEWED: SHE WILL DECLINE LAB WORK   NONE RECENT     MEDICATIONS Lopressor 25 mg daily Synthroid 200 mcg daily depakote 250 mg in the am and 500 mg in the pm Baclofen 10 mg three times daily Vit d 2000 units daily ultracet every 6 hours as needed   Review of Systems  Constitutional: Negative for malaise/fatigue.  Respiratory: Negative for cough and shortness of breath.   Cardiovascular: Positive for leg swelling. Negative for chest pain and palpitations.  Gastrointestinal: Negative for heartburn and abdominal pain.  Musculoskeletal: Negative for myalgias and back pain.  Skin: Negative.         Has chronic skin issues with lower  legs  Neurological: Negative for headaches.  Psychiatric/Behavioral: Negative for depression. The patient is not nervous/anxious.     .Physical Exam  Constitutional: She is oriented to person, place, and time. She appears well-developed and well-nourished.  thin  Neck: Neck supple. No JVD present.  Cardiovascular: Normal rate, regular rhythm and intact distal pulses.   Respiratory: Effort normal and breath sounds normal.  GI: Soft. Bowel sounds are normal. She exhibits no distension. There is no tenderness.  Musculoskeletal: She exhibits edema.  Unable to move lower extremities; has 3+ lower extremity edema she has bilateral hand tremor present with the right hand worse than the left she does have right hand weakness present.    Neurological: She is alert and oriented to person, place, and time.  Skin: Skin is warm and dry.  Has unna boots to lower extremities.   Psychiatric: She has a normal mood and affect.   ASSESSMENT/PLAN 1. Tremor will increase her depakote to 500 mg twice daily at this time; she has agreed to have her labs drawn on 07-10-13 will continue to monitor her status and will continue therapy as directed.   Time spent with patient 30 minutes.

## 2013-10-12 NOTE — Progress Notes (Signed)
Patient ID: [name], female   DOB: 06/11/1951, 62 y.o.   MRN: [id number]  [place]  Allergies  Allergen Reactions  . Codeine   . Morphine And Related   . Penicillins      Chief Complaint  Patient presents with  . Medical Managment of Chronic Issues    HPI:  She is being seen for the management of her chronic illnesses. Staff reports that she is doing a little better taking her insulin. Malachi Bonds and I have had a discussion about this.  There are times when she declines all of her medications. She did decline to start asa and vit d. She did decline to start effexor as well. Overall her status remains without significant change.   Past Medical History  Diagnosis Date  . Hypertension   . Thyroid disease     Hypothyroidism  . CHF (congestive heart failure)     Status post 2-D echocardiogram 08/12/2010, EF 60-65%, grade 1 diastolic dysfunction  . Epilepsy   . Venous insufficiency   . Morbid obesity   . Venous stasis ulcers   . Essential tremor     Past Surgical History  Procedure Laterality Date  . Bilateral cataract surgery with lens implants    . Right total hip replacement    . Left hip hemiarthroplasty    . Right knee open reduction and internal fixation of patella fracture    . Abdominal hysterectomy      VITAL SIGNS BP 114/57  Pulse 68  Ht 5\' 4"  (1.626 m)  Wt 182 lb (82.555 kg)  BMI 31.22 kg/m2  MEDICATIONS:   depakote 500 mg twice daily Synthroid 200 mcg daily Lopressor 25 mg daily Baclofen 10 mg three times daily Lasix 20 mg daily humalog 10 units with meals lantus 45 units daily Metformin 500 mg twice daily ultracet every 6 hours as needed   SIGNIFICANT DIAGNOSTIC EXAMS    LABS REVIEWED: SHE WILL DECLINE LAB WORK   07-21-13: wbc 6.6; hgb 12.3 hct 37.8; mcv 91.5; plt 388; glucose 39; bun 13; creat 0.46; k+6.4; na++ 137 alk phos 123; albumin 3.0; hgb a1c 10; depakote 81.7    Review of Systems  Constitutional: Negative for malaise/fatigue.   Respiratory: Negative for cough and shortness of breath.   Cardiovascular: Positive for leg swelling. Negative for chest pain and palpitations.  Gastrointestinal: Negative for heartburn and abdominal pain.  Musculoskeletal: Negative for myalgias and back pain.  Skin: Negative.         Has chronic skin issues with lower legs  Neurological: Negative for headaches.  Psychiatric/Behavioral: Negative for depression. The patient is not nervous/anxious.     .Physical Exam  Constitutional: She is oriented to person, place, and time. She appears well-developed and well-nourished.  thin  Neck: Neck supple. No JVD present.  Cardiovascular: Normal rate, regular rhythm and intact distal pulses.   Respiratory: Effort normal and breath sounds normal.  GI: Soft. Bowel sounds are normal. She exhibits no distension. There is no tenderness.  Musculoskeletal: She exhibits edema.  Unable to move lower extremities; has 3+ lower extremity edema she has bilateral hand tremor present with the right hand worse than the left she does have right hand weakness present.    Neurological: She is alert and oriented to person, place, and time.  Skin: Skin is warm and dry.  Has unna boots to lower extremities.   Psychiatric: She has a normal mood and affect.    ASSESSMENT/ PLAN:  1. Hypothyroidism: will continue  her synthroid 200 mcg daily  2. Diabetes: will continue her lantus 45 units daily and humalog 10 units with meals; metformin 500 mg twice daily  and will monitor her status   3. chf: will continue lasix 20 mg daily  4. Seizure; no reports of seizure activity present; will continue her depakote 500 mg twice daily and will monitor her status   5. Hypertension: she is stable taking lopresor 25 mg daily  6. Chronic pain: her pain is being managed with baclofen 10 mg three times daily and ultracet every 6 hours as needed

## 2013-10-13 ENCOUNTER — Non-Acute Institutional Stay (SKILLED_NURSING_FACILITY): Payer: Medicaid Other | Admitting: Internal Medicine

## 2013-10-13 DIAGNOSIS — L02419 Cutaneous abscess of limb, unspecified: Secondary | ICD-10-CM

## 2013-10-13 NOTE — Progress Notes (Signed)
Patient ID: [name], female   DOB: Feb 15, 1951, 62 y.o.   MRN: [id number] Facility; [facility] Chief complaint, refusals of dressing changes to her lower leg History; however he admits [name] to the facility last week after his stay at [facility] for healthcare acquired pneumonia. She was discharged on Levaquin to be completed for another week however she has not been compliant with this. She was supposed to have daily dressing changes to the wounds on her right leg however she apparently has refused this since last week and I'm seeing her today in followup. I had asked for lab work to be done in followup from her hospitalization she has refused this  Review of systems Gen. she is apparently not been febrile Respiratory no shortness of breath no cough Cardiac no chest pain Extremities; she has severe lower extremity pain which I think is a combination of musculoskeletal pain as well as severe chronic stasis dermatitis  Physical examination Skin. There's been further deterioration in the skin on her right leg mostly on the lateral aspect of her ankle but also extending around the left side. There is copious amounts of what looks to be purulent drainage. I think the skin is breaking down from a combination of cellulitis and probable maceration induced by the drainage. I discussed the need to have these changed daily.  Impression/plan #1 cellulitis of the right leg with further ulceration related to severe stasis dermatitis, refusals of dressing changes, drainage and is skin maceration. I've changed the dressings to Aquacel Ag and kerlix/coban wrap. There is large areas no skin breakdown on the lateral aspect of the right ankle to a lesser extent on the medial aspect. I will start her on doxycycline hopefully she would be compliant with this

## 2013-10-30 ENCOUNTER — Non-Acute Institutional Stay (SKILLED_NURSING_FACILITY): Payer: Medicaid Other | Admitting: Adult Health

## 2013-10-30 DIAGNOSIS — I509 Heart failure, unspecified: Secondary | ICD-10-CM

## 2013-10-30 DIAGNOSIS — G40909 Epilepsy, unspecified, not intractable, without status epilepticus: Secondary | ICD-10-CM

## 2013-10-30 DIAGNOSIS — G250 Essential tremor: Secondary | ICD-10-CM

## 2013-10-30 DIAGNOSIS — G8929 Other chronic pain: Secondary | ICD-10-CM

## 2013-10-30 DIAGNOSIS — I5032 Chronic diastolic (congestive) heart failure: Secondary | ICD-10-CM

## 2013-10-30 DIAGNOSIS — E1310 Other specified diabetes mellitus with ketoacidosis without coma: Secondary | ICD-10-CM

## 2013-10-30 DIAGNOSIS — I10 Essential (primary) hypertension: Secondary | ICD-10-CM

## 2013-10-30 DIAGNOSIS — E039 Hypothyroidism, unspecified: Secondary | ICD-10-CM

## 2013-10-30 DIAGNOSIS — E1110 Type 2 diabetes mellitus with ketoacidosis without coma: Secondary | ICD-10-CM

## 2013-11-03 ENCOUNTER — Encounter: Payer: Self-pay | Admitting: Adult Health

## 2013-11-03 DIAGNOSIS — G250 Essential tremor: Secondary | ICD-10-CM | POA: Insufficient documentation

## 2013-11-03 MED ORDER — PRIMIDONE 50 MG PO TABS: 25.0000 mg | ORAL_TABLET | Freq: Every day | ORAL | Status: DC

## 2013-11-03 NOTE — Progress Notes (Signed)
Patient ID: [name], female   DOB: 03/12/1951, 62 y.o.   MRN: [id number]     [place]  Allergies  Allergen Reactions  . Codeine   . Morphine And Related   . Penicillins      Chief Complaint  Patient presents with  . Medical Managment of Chronic Issues    HPI: She is being seen for the management of her chronic illnesses. there are no concerns being voiced by the nursing staff at this time. She is complaining of worsening tremors bilaterally. The tremors are interfering with her ability to feed herself; she is also having tremor in her head as well. she is interested in trying medication at this time.    Past Medical History  Diagnosis Date  . Hypertension   . Thyroid disease     Hypothyroidism  . CHF (congestive heart failure)     Status post 2-D echocardiogram 08/12/2010, EF 60-65%, grade 1 diastolic dysfunction  . Epilepsy   . Venous insufficiency   . Morbid obesity   . Venous stasis ulcers   . Essential tremor     Past Surgical History  Procedure Laterality Date  . Bilateral cataract surgery with lens implants    . Right total hip replacement    . Left hip hemiarthroplasty    . Right knee open reduction and internal fixation of patella fracture    . Abdominal hysterectomy      VITAL SIGNS BP 122/70  Pulse 76  Ht 5\' 4"  (1.626 m)  Wt 182 lb (82.555 kg)  BMI 31.22 kg/m2   Patient's Medications  New Prescriptions   No medications on file  Previous Medications   ACETAMINOPHEN (TYLENOL) 325 MG TABLET    Take 650 mg by mouth every 6 (six) hours as needed for pain (Pain).   BACLOFEN (LIORESAL) 10 MG TABLET    Take 1 tablet (10 mg total) by mouth 3 (three) times daily.   CALCIUM CARBONATE (TUMS - DOSED IN MG ELEMENTAL CALCIUM) 500 MG CHEWABLE TABLET    Chew 1 tablet by mouth daily as needed for heartburn (Pt request).   DIVALPROEX (DEPAKOTE) 500 MG DR TABLET    Take 500 mg by mouth 2 (two) times daily.   FEEDING SUPPLEMENT, GLUCERNA SHAKE, (GLUCERNA SHAKE)  LIQD    Take 237 mLs by mouth daily as needed (poor meal intake; encourage supplement if pt consumes <50% of meal).   FUROSEMIDE (LASIX) 20 MG TABLET    Take 20 mg by mouth daily. Takes in the evening   INSULIN GLARGINE (LANTUS) 100 UNIT/ML INJECTION    Inject 0.4 mLs (40 Units total) into the skin at bedtime.   INSULIN LISPRO (HUMALOG) 100 UNIT/ML INJECTION    Inject 10 Units into the skin 3 (three) times daily before meals.   LEVOTHYROXINE (SYNTHROID, LEVOTHROID) 200 MCG TABLET    Take 200 mcg by mouth daily.   LISINOPRIL (PRINIVIL,ZESTRIL) 2.5 MG TABLET    Take 1 tablet (2.5 mg total) by mouth daily.   METFORMIN (GLUCOPHAGE) 500 MG TABLET    Take 500 mg by mouth 2 (two) times daily with a meal.   METOPROLOL TARTRATE (LOPRESSOR) 25 MG TABLET    Take 25 mg by mouth daily.   MULTIPLE VITAMIN (MULTIVITAMIN WITH MINERALS) TABS TABLET    Take 1 tablet by mouth daily.   SACCHAROMYCES BOULARDII (FLORASTOR) 250 MG CAPSULE    Take 250 mg by mouth daily as needed (diarrhea).   TRAMADOL-ACETAMINOPHEN (ULTRACET) 37.5-325 MG PER TABLET  Take one tablet by mouth every 6 hours as needed for pain  Modified Medications   No medications on file  Discontinued Medications   INSULIN ASPART (NOVOLOG) 100 UNIT/ML INJECTION    Inject 10 Units into the skin 3 (three) times daily with meals.   INSULIN ASPART (NOVOLOG) 100 UNIT/ML INJECTION    Inject 6 Units into the skin at bedtime.   LEVOFLOXACIN (LEVAQUIN) 750 MG TABLET    Take 1 tablet (750 mg total) by mouth daily.   PROMETHAZINE (PHENERGAN) 25 MG TABLET    Take 25 mg by mouth every 4 (four) hours as needed for nausea (nausea).    SIGNIFICANT DIAGNOSTIC EXAMS   09-24-13: chest x-ray: Confluent left lung base opacity suspicious for pneumonia in this setting.   09-26-13: chest x-ray: 1. Persistent left lung base opacity. This is consistent with pneumonia in the proper clinical setting. It may reflect atelectasis. 2. Right lung base opacity appears mildly  increased from the previous day's study although this may be technical/positional and not a true change. This also could reflect atelectasis or infiltrate. Atelectasis is favored.3. Vascular congestion without overt edema.    LABS REVIEWED: SHE WILL DECLINE LABS  MOST TIMES.   07-21-13: wbc 6.6 ;hgb 12.3; hct 37.8; mcv 91.5; plt 388; glucose 39; bun 13; creat 0.46; k+ 6.4; na++137 alk phos 123; albumin 3.0; hgb a1c 10.0; depakote 81.7  09-24-13: tsh 0.188; bnp 684.6;  09-25-13: free t4: 1.58; hgb a1c 6.2; urine culture: no growth  09-28-13: wbc 12.1 hgb 10.6; hct 33.7; 91.3; plt 364; glucose 105; bun 14; creat 0.42; k+4.1; na++138; alk phos 135 t.bili <0.1; bnp: 1335.0   Review of Systems  Constitutional: negative for fatigue. Negative for fever and chills.  Respiratory: Negative for shortness of breath and wheezing.   Cardiovascular: Positive for leg swelling. Negative for chest pain and palpitations.       Her swelling is chronic in nature   Gastrointestinal: Negative for heartburn, abdominal pain and constipation.  Musculoskeletal: Negative for back pain and myalgias.  Skin:       Has unna boot to the right lower extremity. Has dry skin   Neurological: Negative for headaches. HAS SEVERE TREMORS IN BOTH ARMS AND HEAD  Psychiatric/Behavioral: Negative for depression. The patient does not have insomnia.     Physical Exam  Constitutional: She is oriented to person, place, and time.  frail  Neck: Neck supple. No JVD present.  Cardiovascular: Normal rate, regular rhythm and intact distal pulses.   Respiratory: Effort normal. No respiratory distress. She has no wheezes.  Breath sounds diminished  GI: Soft. Bowel sounds are normal. She exhibits no distension. There is no tenderness.  Musculoskeletal: She exhibits edema.  Has bilateral resting tremor present. Has full rang of motion to her upper extremities; unable to move her lower extremities.   Neurological: She is alert and oriented to  person, place, and time. SHE HAS BILATERAL RESTING TREMORS PRESENT; WHICH IS INTERFERING WITH HER ABILITY TO FEED HERSELF; IS DESIRING MEDICATION AT THIS TIME.  Skin: Skin is warm and dry.  Has chronic lymph edema present. Has dry skin to her lower extremities; has unna boot to her right lower extremity     ASSESSMENT/ PLAN:  1. Tremor: is worse; will begin primidone 25 mg nightly and will monitor her status.    2. Diabetes: will continue her novolog 10 units with meals 5 units at hs; 40 units lantus daily and metformin 500 mg twice daily and will  continue to monitor her status   3. Chronic heart failure: will continue her lasix 20 mg daily and will monitor her status   4. Hypertension: is well managed; will continue her lisinopril 2.5 mg daily and lopressor 25 mg daily and will continue to monitor her status   5. Seizure disorder: will continue her depakote 500 mg twice daily; there are no reports of seizure activity present.   6. Chronic pain: will continue her baclofen 10 mg three times daily for her spasticity in her lower extremities and will continue her ultracet every 6 hours as needed and will monitor   7. Hypothyroidism:  will continue synthroid 200 mcg daily and will monitor her status.

## 2013-12-07 ENCOUNTER — Non-Acute Institutional Stay (SKILLED_NURSING_FACILITY): Payer: Medicaid Other | Admitting: Nurse Practitioner

## 2013-12-07 ENCOUNTER — Encounter: Payer: Self-pay | Admitting: Nurse Practitioner

## 2013-12-07 DIAGNOSIS — E119 Type 2 diabetes mellitus without complications: Secondary | ICD-10-CM

## 2013-12-07 DIAGNOSIS — G250 Essential tremor: Secondary | ICD-10-CM

## 2013-12-07 DIAGNOSIS — E039 Hypothyroidism, unspecified: Secondary | ICD-10-CM

## 2013-12-07 DIAGNOSIS — D649 Anemia, unspecified: Secondary | ICD-10-CM

## 2013-12-07 DIAGNOSIS — G40909 Epilepsy, unspecified, not intractable, without status epilepticus: Secondary | ICD-10-CM

## 2013-12-07 DIAGNOSIS — I10 Essential (primary) hypertension: Secondary | ICD-10-CM

## 2013-12-07 NOTE — Progress Notes (Signed)
Patient ID: [name], female   DOB: [birth year], 62 y.o.   MRN: [id number]    Nursing Home Location:  [facility]   Place of Service: SNF (31)  PCP: Terald Sleeper, MD  Allergies  Allergen Reactions  . Codeine   . Morphine And Related   . Penicillins     Chief Complaint  Patient presents with  . Medical Managment of Chronic Issues    HPI:  Pt is a 62 year old female who is a LTR of [facility] who is being seen today for routine follow up; nursing reports tremors have improved but still bad and interfering with pts ADLs; pt would like medication changed because they have not improved enough Otherwise pt without complaints, reports legs have improved   Review of Systems:  Review of Systems  Constitutional: Negative for fever and chills.  Respiratory: Negative for shortness of breath and wheezing.   Cardiovascular: Positive for leg swelling (chronic). Negative for chest pain and palpitations.  Gastrointestinal: Negative for heartburn, abdominal pain and constipation.  Genitourinary: Negative for dysuria.  Musculoskeletal: Negative for back pain and myalgias.  Neurological: Positive for tremors. Negative for headaches.  Psychiatric/Behavioral: Negative for depression. The patient does not have insomnia.      Past Medical History  Diagnosis Date  . Hypertension   . Thyroid disease     Hypothyroidism  . CHF (congestive heart failure)     Status post 2-D echocardiogram 08/12/2010, EF 60-65%, grade 1 diastolic dysfunction  . Epilepsy   . Venous insufficiency   . Morbid obesity   . Venous stasis ulcers   . Essential tremor    Past Surgical History  Procedure Laterality Date  . Bilateral cataract surgery with lens implants    . Right total hip replacement    . Left hip hemiarthroplasty    . Right knee open reduction and internal fixation of patella fracture    . Abdominal hysterectomy     Social History:   reports that she has never smoked. She  has never used smokeless tobacco. She reports that she does not drink alcohol or use illicit drugs.  No family history on file.  Medications: Patient's Medications  New Prescriptions   No medications on file  Previous Medications   ACETAMINOPHEN (TYLENOL) 325 MG TABLET    Take 650 mg by mouth every 6 (six) hours as needed for pain (Pain).   BACLOFEN (LIORESAL) 10 MG TABLET    Take 1 tablet (10 mg total) by mouth 3 (three) times daily.   CALCIUM CARBONATE (TUMS - DOSED IN MG ELEMENTAL CALCIUM) 500 MG CHEWABLE TABLET    Chew 1 tablet by mouth daily as needed for heartburn (Pt request).   DIVALPROEX (DEPAKOTE) 500 MG DR TABLET    Take 500 mg by mouth 2 (two) times daily.   FEEDING SUPPLEMENT, GLUCERNA SHAKE, (GLUCERNA SHAKE) LIQD    Take 237 mLs by mouth daily as needed (poor meal intake; encourage supplement if pt consumes <50% of meal).   FUROSEMIDE (LASIX) 20 MG TABLET    Take 20 mg by mouth daily. Takes in the evening   INSULIN GLARGINE (LANTUS) 100 UNIT/ML INJECTION    Inject 0.4 mLs (40 Units total) into the skin at bedtime.   INSULIN LISPRO (HUMALOG) 100 UNIT/ML INJECTION    Inject 10 Units into the skin 3 (three) times daily before meals.   LEVOTHYROXINE (SYNTHROID, LEVOTHROID) 200 MCG TABLET    Take 200 mcg by mouth daily.  LISINOPRIL (PRINIVIL,ZESTRIL) 2.5 MG TABLET    Take 1 tablet (2.5 mg total) by mouth daily.   METFORMIN (GLUCOPHAGE) 500 MG TABLET    Take 500 mg by mouth 2 (two) times daily with a meal.   METOPROLOL TARTRATE (LOPRESSOR) 25 MG TABLET    Take 25 mg by mouth daily.   MULTIPLE VITAMIN (MULTIVITAMIN WITH MINERALS) TABS TABLET    Take 1 tablet by mouth daily.   PRIMIDONE (MYSOLINE) 50 MG TABLET    Take 0.5 tablets (25 mg total) by mouth at bedtime.   SACCHAROMYCES BOULARDII (FLORASTOR) 250 MG CAPSULE    Take 250 mg by mouth daily as needed (diarrhea).   TRAMADOL-ACETAMINOPHEN (ULTRACET) 37.5-325 MG PER TABLET    Take one tablet by mouth every 6 hours as needed for pain    Modified Medications   No medications on file  Discontinued Medications   No medications on file     Physical Exam:  Filed Vitals:   12/07/13 1626  BP: 132/70  Pulse: 84  Temp: 97.3 F (36.3 C)  Resp: 20    Physical Exam  Constitutional: No distress.  HENT:  Mouth/Throat: Oropharynx is clear and moist. No oropharyngeal exudate.  Neck: Normal range of motion. Neck supple.  Cardiovascular: Normal rate, regular rhythm and normal heart sounds.   Pulmonary/Chest: Effort normal and breath sounds normal. No respiratory distress.  Abdominal: Soft. Bowel sounds are normal.  Musculoskeletal: She exhibits edema.  Neurological: She is alert.  Skin: Skin is warm and dry. She is not diaphoretic.     Labs reviewed: Basic Metabolic Panel:  Recent Labs  16/10/96 0350 09/27/13 0340 09/28/13 0512  NA 135 138 138  K 3.7 3.3* 4.1  CL 101 106 104  CO2 25 24 22   GLUCOSE 147* 90 105*  BUN 15 13 14   CREATININE 0.51 0.44* 0.42*  CALCIUM 8.9 8.5 8.6   Liver Function Tests:  Recent Labs  09/25/13 0310 09/27/13 0340 09/28/13 0512  AST 22 39* 36  ALT 5 11 12   ALKPHOS 131* 217* 235*  BILITOT 0.2* 0.1* <0.1*  PROT 5.8* 5.9* 5.7*  ALBUMIN 1.7* 1.6* 1.5*   No results found for this basename: LIPASE, AMYLASE,  in the last 8760 hours  Recent Labs  06/09/13 0420  AMMONIA 15   CBC:  Recent Labs  06/09/13 0135  09/24/13 1040  09/26/13 0350 09/27/13 0340 09/28/13 0512  WBC 11.7*  < > 24.6*  < > 23.4* 19.0* 12.1*  NEUTROABS 9.0*  --  22.4*  --   --   --   --   HGB 15.2*  < > 12.9  < > 11.5* 10.7* 10.6*  HCT 51.0*  < > 39.3  < > 36.1 33.6* 33.7*  MCV 100.6*  < > 91.2  < > 92.3 91.3 91.3  PLT 224  < > 438*  < > 354 371 364  < > = values in this interval not displayed. Cardiac Enzymes:  Recent Labs  06/09/13 0135 06/09/13 0800 06/09/13 1330 09/24/13 1040  CKTOTAL 99  --   --  31  TROPONINI <0.30 <0.30 <0.30  --    BNP: No components found with this basename:  POCBNP,  CBG:  Recent Labs  09/28/13 2110 09/29/13 0752 09/29/13 0838  GLUCAP 97 48* 80   TSH:  Recent Labs  06/09/13 0800 09/24/13 1040  TSH 0.075* 0.188*   A1C: Lab Results  Component Value Date   HGBA1C 6.2* 09/24/2013    Assessment/Plan 1.  Tremor, essential -worse; pt was started on primidone which has helped per staff but tremor still interfering with pts ADLs -will get lab work and then increase primidone to 50 mg daily   2. Seizure disorder -stable at this time, currently on Depakote   3. Hypothyroidism -will get TSH at this time  4. Hypertension -stable on current medications   5. Normocytic anemia -will follow up cbc  6. Diabetes -blood sugars reviewed; within appropriate range; will get AIC at this time  7. Lower extremity venous statis  -followed by wound care; currently unnaboot on right leg    Pt has been previously refusing lab work but is willing to have this drawn at this time

## 2014-01-04 ENCOUNTER — Non-Acute Institutional Stay (SKILLED_NURSING_FACILITY): Payer: Medicaid Other | Admitting: Nurse Practitioner

## 2014-01-04 DIAGNOSIS — I5032 Chronic diastolic (congestive) heart failure: Secondary | ICD-10-CM

## 2014-01-04 DIAGNOSIS — I509 Heart failure, unspecified: Secondary | ICD-10-CM

## 2014-01-04 DIAGNOSIS — E039 Hypothyroidism, unspecified: Secondary | ICD-10-CM

## 2014-01-04 DIAGNOSIS — G250 Essential tremor: Secondary | ICD-10-CM

## 2014-01-04 DIAGNOSIS — E1310 Other specified diabetes mellitus with ketoacidosis without coma: Secondary | ICD-10-CM

## 2014-01-04 DIAGNOSIS — E1110 Type 2 diabetes mellitus with ketoacidosis without coma: Secondary | ICD-10-CM

## 2014-01-04 DIAGNOSIS — G252 Other specified forms of tremor: Secondary | ICD-10-CM

## 2014-01-04 DIAGNOSIS — G40909 Epilepsy, unspecified, not intractable, without status epilepticus: Secondary | ICD-10-CM

## 2014-01-04 DIAGNOSIS — I878 Other specified disorders of veins: Secondary | ICD-10-CM

## 2014-01-04 DIAGNOSIS — I872 Venous insufficiency (chronic) (peripheral): Secondary | ICD-10-CM

## 2014-01-04 DIAGNOSIS — I10 Essential (primary) hypertension: Secondary | ICD-10-CM

## 2014-01-04 NOTE — Progress Notes (Signed)
Patient ID: [name], female   DOB: Mar 13, 1951, 63 y.o.   MRN: [id number]    [facility]   Place of Service: SNF (31)  PCP: Terald SleeperOBSON,[name], MD  Allergies  Allergen Reactions  . Codeine   . Morphine And Related   . Penicillins     Chief Complaint  Patient presents with  . Medical Managment of Chronic Issues    HPI:  Pt is a 63 year old female who is a LTR of [facility] who is being seen today for routine follow up; per pharmacy recs pt has been refusing lantus and would like it to be stopped; pts s tremors have improved; Otherwise pt without complaints, reports legs have improved and cont to be followed by wound care.    Review of Systems:  Review of Systems  Constitutional: Negative for fever and chills.  Respiratory: Negative for shortness of breath and wheezing.   Cardiovascular: Positive for leg swelling (chronic). Negative for chest pain and palpitations.  Gastrointestinal: Negative for heartburn, abdominal pain and constipation.  Genitourinary: Negative for dysuria.  Musculoskeletal: Negative for back pain and myalgias.  Neurological: Positive for tremors (has improved). Negative for headaches.  Psychiatric/Behavioral: Negative for depression. The patient does not have insomnia.      Past Medical History  Diagnosis Date  . Hypertension   . Thyroid disease     Hypothyroidism  . CHF (congestive heart failure)     Status post 2-D echocardiogram 08/12/2010, EF 60-65%, grade 1 diastolic dysfunction  . Epilepsy   . Venous insufficiency   . Morbid obesity   . Venous stasis ulcers   . Essential tremor    Past Surgical History  Procedure Laterality Date  . Bilateral cataract surgery with lens implants    . Right total hip replacement    . Left hip hemiarthroplasty    . Right knee open reduction and internal fixation of patella fracture    . Abdominal hysterectomy     Social History:   reports that she has never  smoked. She has never used smokeless tobacco. She reports that she does not drink alcohol or use illicit drugs.  No family history on file.  Medications: Patient's Medications  New Prescriptions   No medications on file  Previous Medications   ACETAMINOPHEN (TYLENOL) 325 MG TABLET    Take 650 mg by mouth every 6 (six) hours as needed for pain (Pain).   BACLOFEN (LIORESAL) 10 MG TABLET    Take 1 tablet (10 mg total) by mouth 3 (three) times daily.   CALCIUM CARBONATE (TUMS - DOSED IN MG ELEMENTAL CALCIUM) 500 MG CHEWABLE TABLET    Chew 1 tablet by mouth daily as needed for heartburn (Pt request).   DIVALPROEX (DEPAKOTE) 500 MG DR TABLET    Take 500 mg by mouth 2 (two) times daily.   FEEDING SUPPLEMENT, GLUCERNA SHAKE, (GLUCERNA SHAKE) LIQD    Take 237 mLs by mouth daily as needed (poor meal intake; encourage supplement if pt consumes <50% of meal).   FUROSEMIDE (LASIX) 20 MG TABLET    Take 20 mg by mouth daily. Takes in the evening   INSULIN GLARGINE (LANTUS) 100 UNIT/ML INJECTION    Inject 0.4 mLs (40 Units total) into the skin at bedtime.   INSULIN LISPRO (HUMALOG) 100 UNIT/ML INJECTION    Inject 10 Units into the skin 3 (three) times daily before meals.   LEVOTHYROXINE (SYNTHROID, LEVOTHROID) 200 MCG TABLET  Take 175 mcg by mouth daily.    LISINOPRIL (PRINIVIL,ZESTRIL) 2.5 MG TABLET    Take 1 tablet (2.5 mg total) by mouth daily.   METFORMIN (GLUCOPHAGE) 500 MG TABLET    Take 500 mg by mouth 2 (two) times daily with a meal.   METOPROLOL TARTRATE (LOPRESSOR) 25 MG TABLET    Take 25 mg by mouth daily.   MULTIPLE VITAMIN (MULTIVITAMIN WITH MINERALS) TABS TABLET    Take 1 tablet by mouth daily.   SACCHAROMYCES BOULARDII (FLORASTOR) 250 MG CAPSULE    Take 250 mg by mouth daily as needed (diarrhea).   TRAMADOL-ACETAMINOPHEN (ULTRACET) 37.5-325 MG PER TABLET    Take one tablet by mouth every 6 hours as needed for pain  Modified Medications   Modified Medication Previous Medication   PRIMIDONE  (MYSOLINE) 50 MG TABLET primidone (MYSOLINE) 50 MG tablet      Take 50 mg by mouth at bedtime.    Take 0.5 tablets (25 mg total) by mouth at bedtime.  Discontinued Medications   No medications on file     Physical Exam: Physical Exam  Constitutional: She is well-developed, well-nourished, and in no distress.  HENT:  Head: Normocephalic and atraumatic.  Right Ear: External ear normal.  Left Ear: External ear normal.  Nose: Nose normal.  Mouth/Throat: Oropharynx is clear and moist. No oropharyngeal exudate.  Neck: Normal range of motion. Neck supple.  Cardiovascular: Normal rate, regular rhythm and normal heart sounds.   Pulmonary/Chest: Effort normal and breath sounds normal. No respiratory distress.  Abdominal: Soft. Bowel sounds are normal. She exhibits no distension.  Musculoskeletal: She exhibits edema.  Neurological: She is alert.  Skin: Skin is warm and dry. She is not diaphoretic.  Psychiatric: Affect normal.    Filed Vitals:   01/04/14 1458  BP: 142/68  Pulse: 66  Temp: 98 F (36.7 C)  Resp: 20      Labs reviewed: Basic Metabolic Panel:  Recent Labs  40/98/11 0350 09/27/13 0340 09/28/13 0512  NA 135 138 138  K 3.7 3.3* 4.1  CL 101 106 104  CO2 25 24 22   GLUCOSE 147* 90 105*  BUN 15 13 14   CREATININE 0.51 0.44* 0.42*  CALCIUM 8.9 8.5 8.6   Liver Function Tests:  Recent Labs  09/25/13 0310 09/27/13 0340 09/28/13 0512  AST 22 39* 36  ALT 5 11 12   ALKPHOS 131* 217* 235*  BILITOT 0.2* 0.1* <0.1*  PROT 5.8* 5.9* 5.7*  ALBUMIN 1.7* 1.6* 1.5*   No results found for this basename: LIPASE, AMYLASE,  in the last 8760 hours  Recent Labs  06/09/13 0420  AMMONIA 15   CBC:  Recent Labs  06/09/13 0135  09/24/13 1040  09/26/13 0350 09/27/13 0340 09/28/13 0512  WBC 11.7*  < > 24.6*  < > 23.4* 19.0* 12.1*  NEUTROABS 9.0*  --  22.4*  --   --   --   --   HGB 15.2*  < > 12.9  < > 11.5* 10.7* 10.6*  HCT 51.0*  < > 39.3  < > 36.1 33.6* 33.7*  MCV  100.6*  < > 91.2  < > 92.3 91.3 91.3  PLT 224  < > 438*  < > 354 371 364  < > = values in this interval not displayed. Cardiac Enzymes:  Recent Labs  06/09/13 0135 06/09/13 0800 06/09/13 1330 09/24/13 1040  CKTOTAL 99  --   --  31  TROPONINI <0.30 <0.30 <0.30  --  BNP: No components found with this basename: POCBNP,  CBG:  Recent Labs  09/28/13 2110 09/29/13 0752 09/29/13 0838  GLUCAP 97 48* 80   TSH:  Recent Labs  06/09/13 0800 09/24/13 1040  TSH 0.075* 0.188*   A1C: Lab Results  Component Value Date   HGBA1C 6.2* 09/24/2013   CBC NO Diff (Complete Blood Count)    Result: 12/11/2013 10:13 AM   ( Status: F )     C WBC 9.7     4.0-10.5 K/uL SLN   RBC 3.51   L 3.87-5.11 MIL/uL SLN   Hemoglobin 9.9   L 12.0-15.0 g/dL SLN   Hematocrit 11.9   L 36.0-46.0 % SLN   MCV 85.8     78.0-100.0 fL SLN   MCH 28.2     26.0-34.0 pg SLN   MCHC 32.9     30.0-36.0 g/dL SLN   RDW 14.7   H 82.9-56.2 % SLN   Platelet Count 379     150-400 K/uL SLN   Comprehensive Metabolic Panel    Result: 12/11/2013 11:33 AM   ( Status: F )       Sodium 137     135-145 mEq/L SLN   Potassium 6.1   H 3.5-5.3 mEq/L SLN C Chloride 103     96-112 mEq/L SLN   CO2 21     19-32 mEq/L SLN   Glucose 78     70-99 mg/dL SLN   BUN 23     1-30 mg/dL SLN   Creatinine 8.65     0.50-1.10 mg/dL SLN   Bilirubin, Total 0.2   L 0.3-1.2 mg/dL SLN   Alkaline Phosphatase 141   H 39-117 U/L SLN   AST/SGOT 19     0-37 U/L SLN   ALT/SGPT <8     0-35 U/L SLN   Total Protein 7.3     6.0-8.3 g/dL SLN   Albumin 3.2   L 7.8-4.6 g/dL SLN   Calcium 8.7     9.6-29.5 mg/dL SLN   TSH, Ultrasensitive    Result: 12/11/2013 11:18 AM   ( Status: F )       TSH 0.137   L 0.350-4.500 uIU/mL SLN   Hemoglobin A1C    Result: 12/11/2013 11:25 AM   ( Status: F )       Hemoglobin A1C 5.9   H <5.7 % SLN C Estimated Average Glucose 123   H <117 mg/dL SLN   Valproic Acid (Depakene)    Result: 12/11/2013 1:05 PM   ( Status: F )         Valproic Acid (Depakene) 85.5      Assessment/Plan 1. Chronic diastolic CHF (congestive heart failure) Patients heart failure is stable; continue current regimen. Will monitor and make changes as necessary.  2. Hypertension -Patients hypertension  is stable; continue current regimen. Will monitor and make changes as necessary.  3. Hypothyroidism -synthroid has been adjusted due to recent low TSH -has follow up blood work scheduled   4. DM (diabetes mellitus) type 2, uncontrolled, with hyperosmolarity syndrome -will stop lantus since she is not taking any ways -A1c was at goal on recent labs -will cont to monitor blood sugars  5. Tremor, essential -improved  6. Seizure disorder -no recent seizures, conts depakote  7. Lower extremity venous stasis -conts to be followed by wound care with unna boots applied

## 2014-02-01 ENCOUNTER — Encounter: Payer: Self-pay | Admitting: Nurse Practitioner

## 2014-02-01 ENCOUNTER — Non-Acute Institutional Stay (SKILLED_NURSING_FACILITY): Payer: Medicaid Other | Admitting: Nurse Practitioner

## 2014-02-01 DIAGNOSIS — I878 Other specified disorders of veins: Secondary | ICD-10-CM

## 2014-02-01 DIAGNOSIS — IMO0002 Reserved for concepts with insufficient information to code with codable children: Secondary | ICD-10-CM

## 2014-02-01 DIAGNOSIS — E039 Hypothyroidism, unspecified: Secondary | ICD-10-CM

## 2014-02-01 DIAGNOSIS — I509 Heart failure, unspecified: Secondary | ICD-10-CM

## 2014-02-01 DIAGNOSIS — G252 Other specified forms of tremor: Secondary | ICD-10-CM

## 2014-02-01 DIAGNOSIS — I872 Venous insufficiency (chronic) (peripheral): Secondary | ICD-10-CM

## 2014-02-01 DIAGNOSIS — D649 Anemia, unspecified: Secondary | ICD-10-CM

## 2014-02-01 DIAGNOSIS — IMO0001 Reserved for inherently not codable concepts without codable children: Secondary | ICD-10-CM

## 2014-02-01 DIAGNOSIS — I10 Essential (primary) hypertension: Secondary | ICD-10-CM

## 2014-02-01 DIAGNOSIS — G250 Essential tremor: Secondary | ICD-10-CM

## 2014-02-01 DIAGNOSIS — E1165 Type 2 diabetes mellitus with hyperglycemia: Secondary | ICD-10-CM

## 2014-02-01 DIAGNOSIS — G8929 Other chronic pain: Secondary | ICD-10-CM

## 2014-02-01 DIAGNOSIS — I5032 Chronic diastolic (congestive) heart failure: Secondary | ICD-10-CM

## 2014-02-01 NOTE — Progress Notes (Signed)
Patient ID: [name], female   DOB: [birth year], 63 y.o.   MRN: [id number]    Nursing Home Location:  [facility]   Place of Service: SNF (31)  PCP: Terald Sleeper, MD  Allergies  Allergen Reactions  . Codeine   . Morphine And Related   . Penicillins     Chief Complaint  Patient presents with  . Medical Managment of Chronic Issues    HPI:  Pt is a 63 year old female who is a LTR of [facility] who is being seen today for routine follow up on chronic conditions; pt had follow up blood work but she declined when staff went to draw the blood; pt reports she is doing well, no complaints. Does not want 2pm baclofen dose because she does not need it and is not taking MVI due to making her feel sick  Chronic leg wounds followed by wound care. No noted changes in the last month.   Review of Systems:  Review of Systems  Constitutional: Negative for fever and chills.  Respiratory: Negative for shortness of breath and wheezing.   Cardiovascular: Positive for leg swelling (STABLE). Negative for chest pain and palpitations.  Gastrointestinal: Negative for heartburn, abdominal pain and constipation.  Genitourinary: Negative for dysuria.  Musculoskeletal: Negative for back pain and myalgias.  Neurological: Positive for tremors (STABLE). Negative for headaches.  Psychiatric/Behavioral: Negative for depression. The patient does not have insomnia.      Past Medical History  Diagnosis Date  . Hypertension   . Thyroid disease     Hypothyroidism  . CHF (congestive heart failure)     Status post 2-D echocardiogram 08/12/2010, EF 60-65%, grade 1 diastolic dysfunction  . Epilepsy   . Venous insufficiency   . Morbid obesity   . Venous stasis ulcers   . Essential tremor    Past Surgical History  Procedure Laterality Date  . Bilateral cataract surgery with lens implants    . Right total hip replacement    . Left hip hemiarthroplasty    . Right knee open reduction  and internal fixation of patella fracture    . Abdominal hysterectomy     Social History:   reports that she has never smoked. She has never used smokeless tobacco. She reports that she does not drink alcohol or use illicit drugs.  No family history on file.  Medications: Patient's Medications  New Prescriptions   No medications on file  Previous Medications   ACETAMINOPHEN (TYLENOL) 325 MG TABLET    Take 650 mg by mouth every 6 (six) hours as needed for pain (Pain).   BACLOFEN (LIORESAL) 10 MG TABLET    Take 1 tablet (10 mg total) by mouth 3 (three) times daily.   CALCIUM CARBONATE (TUMS - DOSED IN MG ELEMENTAL CALCIUM) 500 MG CHEWABLE TABLET    Chew 1 tablet by mouth daily as needed for heartburn (Pt request).   DIVALPROEX (DEPAKOTE) 500 MG DR TABLET    Take 500 mg by mouth 2 (two) times daily.   FEEDING SUPPLEMENT, GLUCERNA SHAKE, (GLUCERNA SHAKE) LIQD    Take 237 mLs by mouth daily as needed (poor meal intake; encourage supplement if pt consumes <50% of meal).   FUROSEMIDE (LASIX) 20 MG TABLET    Take 20 mg by mouth daily. Takes in the evening   INSULIN LISPRO (HUMALOG) 100 UNIT/ML INJECTION    Inject 10 Units into the skin 3 (three) times daily before meals.   LEVOTHYROXINE (SYNTHROID, LEVOTHROID) 200  MCG TABLET    Take 175 mcg by mouth daily.    LISINOPRIL (PRINIVIL,ZESTRIL) 2.5 MG TABLET    Take 1 tablet (2.5 mg total) by mouth daily.   METFORMIN (GLUCOPHAGE) 500 MG TABLET    Take 500 mg by mouth 2 (two) times daily with a meal.   METOPROLOL TARTRATE (LOPRESSOR) 25 MG TABLET    Take 25 mg by mouth daily.   MULTIPLE VITAMIN (MULTIVITAMIN WITH MINERALS) TABS TABLET    Take 1 tablet by mouth daily.   PRIMIDONE (MYSOLINE) 50 MG TABLET    Take 50 mg by mouth at bedtime.   SACCHAROMYCES BOULARDII (FLORASTOR) 250 MG CAPSULE    Take 250 mg by mouth daily as needed (diarrhea).   TRAMADOL-ACETAMINOPHEN (ULTRACET) 37.5-325 MG PER TABLET    Take one tablet by mouth every 6 hours as needed for  pain  Modified Medications   No medications on file  Discontinued Medications   INSULIN GLARGINE (LANTUS) 100 UNIT/ML INJECTION    Inject 0.4 mLs (40 Units total) into the skin at bedtime.     Physical Exam:  Filed Vitals:   02/01/14 1307  BP: 96/69  Pulse: 76  Temp: 97.2 F (36.2 C)  Resp: 20   Physical Exam  Constitutional: She is well-developed, well-nourished, and in no distress.  Neck: Normal range of motion. Neck supple.  Cardiovascular: Normal rate, regular rhythm and normal heart sounds.   Pulmonary/Chest: Effort normal and breath sounds normal. No respiratory distress.  Abdominal: Soft. Bowel sounds are normal. She exhibits no distension.  Musculoskeletal: She exhibits edema.  Neurological: She is alert.  Skin: Skin is warm and dry. She is not diaphoretic.  Psychiatric: Affect normal.     Labs reviewed: Basic Metabolic Panel:  Recent Labs  16/10/96 0350 09/27/13 0340 09/28/13 0512  NA 135 138 138  K 3.7 3.3* 4.1  CL 101 106 104  CO2 25 24 22   GLUCOSE 147* 90 105*  BUN 15 13 14   CREATININE 0.51 0.44* 0.42*  CALCIUM 8.9 8.5 8.6   Liver Function Tests:  Recent Labs  09/25/13 0310 09/27/13 0340 09/28/13 0512  AST 22 39* 36  ALT 5 11 12   ALKPHOS 131* 217* 235*  BILITOT 0.2* 0.1* <0.1*  PROT 5.8* 5.9* 5.7*  ALBUMIN 1.7* 1.6* 1.5*   No results found for this basename: LIPASE, AMYLASE,  in the last 8760 hours  Recent Labs  06/09/13 0420  AMMONIA 15   CBC:  Recent Labs  06/09/13 0135  09/24/13 1040  09/26/13 0350 09/27/13 0340 09/28/13 0512  WBC 11.7*  < > 24.6*  < > 23.4* 19.0* 12.1*  NEUTROABS 9.0*  --  22.4*  --   --   --   --   HGB 15.2*  < > 12.9  < > 11.5* 10.7* 10.6*  HCT 51.0*  < > 39.3  < > 36.1 33.6* 33.7*  MCV 100.6*  < > 91.2  < > 92.3 91.3 91.3  PLT 224  < > 438*  < > 354 371 364  < > = values in this interval not displayed. Cardiac Enzymes:  Recent Labs  06/09/13 0135 06/09/13 0800 06/09/13 1330 09/24/13 1040    CKTOTAL 99  --   --  31  TROPONINI <0.30 <0.30 <0.30  --    BNP: No components found with this basename: POCBNP,  CBG:  Recent Labs  09/28/13 2110 09/29/13 0752 09/29/13 0838  GLUCAP 97 48* 80   TSH:  Recent Labs  06/09/13 0800 09/24/13 1040  TSH 0.075* 0.188*   A1C: Lab Results  Component Value Date   HGBA1C 6.2* 09/24/2013   CBC NO Diff (Complete Blood Count)  Result: 12/11/2013 10:13 AM ( Status: F ) C  WBC 9.7 4.0-10.5 K/uL SLN  RBC 3.51 L 3.87-5.11 MIL/uL SLN  Hemoglobin 9.9 L 12.0-15.0 g/dL SLN  Hematocrit 98.130.1 L 36.0-46.0 % SLN  MCV 85.8 78.0-100.0 fL SLN  MCH 28.2 26.0-34.0 pg SLN  MCHC 32.9 30.0-36.0 g/dL SLN  RDW 19.118.1 H 47.8-29.511.5-15.5 % SLN  Platelet Count 379 150-400 K/uL SLN  Comprehensive Metabolic Panel  Result: 12/11/2013 11:33 AM ( Status: F )  Sodium 137 135-145 mEq/L SLN  Potassium 6.1 H 3.5-5.3 mEq/L SLN C  Chloride 103 96-112 mEq/L SLN  CO2 21 19-32 mEq/L SLN  Glucose 78 70-99 mg/dL SLN  BUN 23 6-216-23 mg/dL SLN  Creatinine 3.080.65 6.57-8.460.50-1.10 mg/dL SLN  Bilirubin, Total 0.2 L 0.3-1.2 mg/dL SLN  Alkaline Phosphatase 141 H 39-117 U/L SLN  AST/SGOT 19 0-37 U/L SLN  ALT/SGPT <8 0-35 U/L SLN  Total Protein 7.3 6.0-8.3 g/dL SLN  Albumin 3.2 L 9.6-2.93.5-5.2 g/dL SLN  Calcium 8.7 5.2-84.18.4-10.5 mg/dL SLN  TSH, Ultrasensitive  Result: 12/11/2013 11:18 AM ( Status: F )  TSH 0.137 L 0.350-4.500 uIU/mL SLN  Hemoglobin A1C  Result: 12/11/2013 11:25 AM ( Status: F )  Hemoglobin A1C 5.9 H <5.7 % SLN C  Estimated Average Glucose 123 H <117 mg/dL SLN  Valproic Acid (Depakene)  Result: 12/11/2013 1:05 PM ( Status: F )  Valproic Acid (Depakene) 85.5    Assessment/Plan 1. Hypothyroidism -synthroid was adjusted a few months back, pt has not allowed for repeat blood work, agrees for this to be done today -will follow up tsh  2. Hypertension -Patient is stable; continue current regimen. Will monitor and make changes as necessary.  3. Chronic diastolic CHF (congestive  heart failure) -no signs of worsening heart failure, remains stable on lasix  -will follow up bmp 4. DM (diabetes mellitus), type 2, uncontrolled -stable on current medications -will get A1c  5. Tremor, essential -has improved on primidone  6. Lower extremity venous stasis -chronic; conts to be followed by wound care due to ulcers  7. Chronic pain -managed by current medications -does not want 2 pm baclofen; will dc at this time 8. Normocytic anemia -will follow up blood work, pt does not wish to take MVI due to making her sick, she is refusing this when it is given to her -will dc at this time   Labs/tests ordered CBC, BMP, TSH, A1C

## 2014-03-30 ENCOUNTER — Non-Acute Institutional Stay (SKILLED_NURSING_FACILITY): Payer: Medicaid Other | Admitting: Internal Medicine

## 2014-03-30 DIAGNOSIS — E1165 Type 2 diabetes mellitus with hyperglycemia: Secondary | ICD-10-CM

## 2014-03-30 DIAGNOSIS — IMO0001 Reserved for inherently not codable concepts without codable children: Secondary | ICD-10-CM

## 2014-03-30 DIAGNOSIS — IMO0002 Reserved for concepts with insufficient information to code with codable children: Secondary | ICD-10-CM

## 2014-03-30 DIAGNOSIS — E039 Hypothyroidism, unspecified: Secondary | ICD-10-CM

## 2014-04-04 NOTE — Progress Notes (Addendum)
Patient ID: [name], female   DOB: 1951-11-28, 63 y.o.   MRN: [id number]                   PROGRESS NOTE  DATE:  03/30/2014      [facility]    LEVEL OF CARE:   SNF   Acute Visit   CHIEF COMPLAINT:  Follow up diabetes and other issues.    HISTORY OF PRESENT ILLNESS:  [name] is a known diabetic, but had not been on anything routinely for diabetes.  She was admitted to hospital in October 2014 with healthcare-acquired pneumonia versus cellulitis and sepsis.  She had extreme hyperglycemia with a blood sugar of over 1000.  She came back to the facility on NovoLog insulin 10 U a.c. meals if her blood sugar was greater than 200, and metformin 500 b.i.d.  Her last hemoglobin A1c was 5.9 on 12/08/2013.  She has been refusing her metformin and she has not been receiving the insulin.  Her blood sugars appear to be well controlled in the morning, then 120-140 a.c. dinner.    PHYSICAL EXAMINATION:   GENERAL APPEARANCE:  The patient is not in any distress.  She appears to have lost some weight.   CHEST/RESPIRATORY:  Clear air entry bilaterally.   CARDIOVASCULAR:  CARDIAC:   Heart sounds are normal.  There are no murmurs.  She appears to be euvolemic.   GASTROINTESTINAL:  LIVER/SPLEEN/KIDNEYS:  No liver, no spleen.  No tenderness.    ASSESSMENT/PLAN:  Type 2 diabetes.  Once again, she appears to be well controlled on virtually no diabetes medications.  As she is not taking the metformin, I am going to discontinue it.  Continue on surveillance CBGs b.i.d. for now.  However, if this remains stable, we may simply be able to put her on surveillance twice a week or so.    Hypothyroidism.  On replacement.   Last TSH I see was on 12/08/2013, reduced at 0.137.  Her Synthroid was reduced to 175 mcg.  I do not see a follow-up TSH.  However, she may have refused.  I will try to recheck this, as well.

## 2014-04-05 ENCOUNTER — Encounter: Payer: Self-pay | Admitting: Nurse Practitioner

## 2014-04-05 ENCOUNTER — Non-Acute Institutional Stay (SKILLED_NURSING_FACILITY): Payer: Medicaid Other | Admitting: Nurse Practitioner

## 2014-04-05 DIAGNOSIS — I10 Essential (primary) hypertension: Secondary | ICD-10-CM

## 2014-04-05 DIAGNOSIS — I5032 Chronic diastolic (congestive) heart failure: Secondary | ICD-10-CM

## 2014-04-05 DIAGNOSIS — IMO0001 Reserved for inherently not codable concepts without codable children: Secondary | ICD-10-CM

## 2014-04-05 DIAGNOSIS — I509 Heart failure, unspecified: Secondary | ICD-10-CM

## 2014-04-05 DIAGNOSIS — E039 Hypothyroidism, unspecified: Secondary | ICD-10-CM

## 2014-04-05 DIAGNOSIS — I878 Other specified disorders of veins: Secondary | ICD-10-CM

## 2014-04-05 DIAGNOSIS — IMO0002 Reserved for concepts with insufficient information to code with codable children: Secondary | ICD-10-CM

## 2014-04-05 DIAGNOSIS — E1165 Type 2 diabetes mellitus with hyperglycemia: Secondary | ICD-10-CM

## 2014-04-05 DIAGNOSIS — I872 Venous insufficiency (chronic) (peripheral): Secondary | ICD-10-CM

## 2014-04-05 NOTE — Progress Notes (Signed)
Patient ID: SOL ODOR, female   DOB: 09-03-1951, 63 y.o.   MRN: [id number]   Nursing Home Location:  [facility]   Place of Service: SNF (31)  PCP: Terald Sleeper, MD  Allergies  Allergen Reactions  . Codeine   . Morphine And Related   . Penicillins     Chief Complaint  Patient presents with  . Medical Management of Chronic Issues    HPI:  63 year old female patient seen today for medical management of multiple medical problems including CHF, HTN, DM, and Hypothyroidism. Ms. Gift has been refusing medications, some meals/snacks, FSBS checks, and VS checks. She reports she has refused breakfast at times to keep blood sugar within normal limits. Fasting FSBS ranging 90's. She has not experienced significant hypoglycemia. Her weight is 158 lbs currently. She complain of tinnitus, without ear pain, discharge, fever, or other symptoms. There has been no recent illness. Her venous stasis and bilateral lower extremity wounds are stable and non-infected.   Review of Systems:  Review of Systems  Constitutional: Negative for fever and chills.  HENT: Positive for tinnitus. Negative for congestion, ear discharge, ear pain, hearing loss, nosebleeds and sore throat.   Eyes: Negative for discharge and redness.  Respiratory: Negative for cough, sputum production, shortness of breath and wheezing.   Cardiovascular: Positive for leg swelling (chronic bilateral). Negative for chest pain and palpitations.  Genitourinary: Negative for dysuria and hematuria.  Musculoskeletal: Negative for back pain, falls, joint pain and myalgias.  Skin: Negative for rash.  Neurological: Negative for dizziness, seizures, loss of consciousness and headaches.  Psychiatric/Behavioral: Negative for depression and memory loss. The patient does not have insomnia.     Past Medical History  Diagnosis Date  . Hypertension   . Thyroid disease     Hypothyroidism  . CHF (congestive heart failure)    Status post 2-D echocardiogram 08/12/2010, EF 60-65%, grade 1 diastolic dysfunction  . Epilepsy   . Venous insufficiency   . Morbid obesity   . Venous stasis ulcers   . Essential tremor    Past Surgical History  Procedure Laterality Date  . Bilateral cataract surgery with lens implants    . Right total hip replacement    . Left hip hemiarthroplasty    . Right knee open reduction and internal fixation of patella fracture    . Abdominal hysterectomy     Social History:   reports that she has never smoked. She has never used smokeless tobacco. She reports that she does not drink alcohol or use illicit drugs.  No family history on file.  Medications: Patient's Medications  New Prescriptions   No medications on file  Previous Medications   ACETAMINOPHEN (TYLENOL) 325 MG TABLET    Take 650 mg by mouth every 6 (six) hours as needed for pain (Pain).   BACLOFEN (LIORESAL) 10 MG TABLET    Take 1 tablet (10 mg total) by mouth 3 (three) times daily.   CALCIUM CARBONATE (TUMS - DOSED IN MG ELEMENTAL CALCIUM) 500 MG CHEWABLE TABLET    Chew 1 tablet by mouth daily as needed for heartburn (Pt request).   DIVALPROEX (DEPAKOTE) 500 MG DR TABLET    Take 500 mg by mouth 2 (two) times daily.   FEEDING SUPPLEMENT, GLUCERNA SHAKE, (GLUCERNA SHAKE) LIQD    Take 237 mLs by mouth daily as needed (poor meal intake; encourage supplement if pt consumes <50% of meal).   FUROSEMIDE (LASIX) 20 MG TABLET  Take 20 mg by mouth daily. Takes in the evening   INSULIN LISPRO (HUMALOG) 100 UNIT/ML INJECTION    Inject 10 Units into the skin 3 (three) times daily before meals.   LEVOTHYROXINE (SYNTHROID, LEVOTHROID) 200 MCG TABLET    Take 175 mcg by mouth daily.    LISINOPRIL (PRINIVIL,ZESTRIL) 2.5 MG TABLET    Take 1 tablet (2.5 mg total) by mouth daily.   METFORMIN (GLUCOPHAGE) 500 MG TABLET    Take 500 mg by mouth 2 (two) times daily with a meal.   METOPROLOL TARTRATE (LOPRESSOR) 25 MG TABLET    Take 25 mg by mouth  daily.   MULTIPLE VITAMIN (MULTIVITAMIN WITH MINERALS) TABS TABLET    Take 1 tablet by mouth daily.   PRIMIDONE (MYSOLINE) 50 MG TABLET    Take 50 mg by mouth at bedtime.   SACCHAROMYCES BOULARDII (FLORASTOR) 250 MG CAPSULE    Take 250 mg by mouth daily as needed (diarrhea).   TRAMADOL-ACETAMINOPHEN (ULTRACET) 37.5-325 MG PER TABLET    Take one tablet by mouth every 6 hours as needed for pain  Modified Medications   No medications on file  Discontinued Medications   No medications on file     Physical Exam:  Filed Vitals:   04/05/14 1523  BP: 106/59  Pulse: 65  Temp: 97.4 F (36.3 C)  Resp: 18  Weight: 158 lb (71.668 kg)   Physical Exam  Nursing note and vitals reviewed. Constitutional: No distress.  HENT:  Head: Normocephalic and atraumatic.  Right Ear: External ear normal.  Left Ear: External ear normal.  Nose: Nose normal.  Mouth/Throat: Oropharynx is clear and moist. No oropharyngeal exudate.  Eyes: Pupils are equal, round, and reactive to light. Right eye exhibits no discharge. Left eye exhibits no discharge.  Neck: Normal range of motion. No JVD present. No tracheal deviation present. No thyromegaly present.  Cardiovascular: Normal rate and regular rhythm.   Pulmonary/Chest: Effort normal and breath sounds normal. No respiratory distress. She has no wheezes.  Abdominal: Soft. Bowel sounds are normal. There is no tenderness.  Musculoskeletal: She exhibits edema (bilateral lower extremities).  Lymphadenopathy:    She has no cervical adenopathy.  Neurological: She is alert.  Skin: Skin is warm. She is not diaphoretic. There is pallor.  Psychiatric: Memory and affect normal.      Labs reviewed: Basic Metabolic Panel:  Recent Labs  16/10/96 0350 09/27/13 0340 09/28/13 0512  NA 135 138 138  K 3.7 3.3* 4.1  CL 101 106 104  CO2 25 24 22   GLUCOSE 147* 90 105*  BUN 15 13 14   CREATININE 0.51 0.44* 0.42*  CALCIUM 8.9 8.5 8.6   Liver Function Tests:  Recent  Labs  09/25/13 0310 09/27/13 0340 09/28/13 0512  AST 22 39* 36  ALT 5 11 12   ALKPHOS 131* 217* 235*  BILITOT 0.2* 0.1* <0.1*  PROT 5.8* 5.9* 5.7*  ALBUMIN 1.7* 1.6* 1.5*   No results found for this basename: LIPASE, AMYLASE,  in the last 8760 hours  Recent Labs  06/09/13 0420  AMMONIA 15   CBC:  Recent Labs  06/09/13 0135  09/24/13 1040  09/26/13 0350 09/27/13 0340 09/28/13 0512  WBC 11.7*  < > 24.6*  < > 23.4* 19.0* 12.1*  NEUTROABS 9.0*  --  22.4*  --   --   --   --   HGB 15.2*  < > 12.9  < > 11.5* 10.7* 10.6*  HCT 51.0*  < > 39.3  < >  36.1 33.6* 33.7*  MCV 100.6*  < > 91.2  < > 92.3 91.3 91.3  PLT 224  < > 438*  < > 354 371 364  < > = values in this interval not displayed. Cardiac Enzymes:  Recent Labs  06/09/13 0135 06/09/13 0800 06/09/13 1330 09/24/13 1040  CKTOTAL 99  --   --  31  TROPONINI <0.30 <0.30 <0.30  --    BNP: No components found with this basename: POCBNP,  CBG:  Recent Labs  09/28/13 2110 09/29/13 0752 09/29/13 0838  GLUCAP 97 48* 80   TSH:  Recent Labs  06/09/13 0800 09/24/13 1040  TSH 0.075* 0.188*   A1C: Lab Results  Component Value Date   HGBA1C 6.2* 09/24/2013   CBC NO Diff (Complete Blood Count)    Result: 03/31/2014 12:25 PM   ( Status: F )     C WBC 8.3     4.0-10.5 K/uL SLN   RBC 3.90     3.87-5.11 MIL/uL SLN   Hemoglobin 11.0   L 12.0-15.0 g/dL SLN   Hematocrit 40.933.7   L 36.0-46.0 % SLN   MCV 86.4     78.0-100.0 fL SLN   MCH 28.2     26.0-34.0 pg SLN   MCHC 32.6     30.0-36.0 g/dL SLN   RDW 81.117.7   H 91.4-78.211.5-15.5 % SLN   Platelet Count 338     150-400 K/uL SLN   Comprehensive Metabolic Panel    Result: 02/02/2014 9:04 PM   ( Status: F )       Sodium 138     135-145 mEq/L SLN   Potassium 4.9     3.5-5.3 mEq/L SLN   Chloride 103     96-112 mEq/L SLN   CO2 23     19-32 mEq/L SLN   Glucose 113   H 70-99 mg/dL SLN   BUN 28   H 9-566-23 mg/dL SLN   Creatinine 2.130.52     0.50-1.10 mg/dL SLN   Bilirubin, Total 0.2       0.2-1.2 mg/dL SLN C Alkaline Phosphatase 91     39-117 U/L SLN   AST/SGOT 14     0-37 U/L SLN   ALT/SGPT <8     0-35 U/L SLN   Total Protein 7.1     6.0-8.3 g/dL SLN   Albumin 3.2   L 0.8-6.53.5-5.2 g/dL SLN   Calcium 8.7     7.8-46.98.4-10.5 mg/dL SLN    TSH, Ultrasensitive    Result: 02/03/2014 12:09 AM   ( Status: F )       TSH 0.244   L 0.350-4.500 uIU/mL SLN    Hemoglobin A1C    Result: 02/02/2014 11:30 PM   ( Status: F )       Hemoglobin A1C 5.6     <5.7 % SLN C Estimated Average Glucose 114     <117 mg/dL SLN    CBC NO Diff (Complete Blood Count)    Result: 03/31/2014 12:25 PM   ( Status: F )     C WBC 8.3     4.0-10.5 K/uL SLN   RBC 3.90     3.87-5.11 MIL/uL SLN   Hemoglobin 11.0   L 12.0-15.0 g/dL SLN   Hematocrit 62.933.7   L 36.0-46.0 % SLN   MCV 86.4     78.0-100.0 fL SLN   MCH 28.2     26.0-34.0 pg SLN   MCHC 32.6  30.0-36.0 g/dL SLN   RDW 16.117.7   H 09.6-04.511.5-15.5 % SLN   Platelet Count 338     150-400 K/uL SLN   TSH, Ultrasensitive    Result: 03/31/2014 1:06 PM   ( Status: F )       TSH 1.565     0.350-4.500 uIU/mL SLN   Hemoglobin A1C    Result: 03/31/2014 2:55 PM   ( Status: F )       Hemoglobin A1C 5.8   H <5.7 % SLN C Estimated Average Glucose 120   H <117 mg/dL SLN Assessment/Plan 1. Hypertension Hypertension has been well controlled with current regimen. Will continue to monitor.   2. Hypothyroidism TSH now within normal range, conts synthroid 150.   3. DM (diabetes mellitus), type 2, uncontrolled HgAIC was . Will continue to monitor and leave management plan the same.   4. Lower extremity venous stasis Chronic stasis wounds stable and non-infected. Will continue wound care as ordered.  5. Chronic diastolic CHF (congestive heart failure) No CHF exacerbations. Chronic lower extremity edema. Will continue Lasix and monitor for significant changes.

## 2014-05-03 ENCOUNTER — Non-Acute Institutional Stay (SKILLED_NURSING_FACILITY): Payer: Medicaid Other | Admitting: Nurse Practitioner

## 2014-05-03 ENCOUNTER — Encounter: Payer: Self-pay | Admitting: Nurse Practitioner

## 2014-05-03 DIAGNOSIS — IMO0002 Reserved for concepts with insufficient information to code with codable children: Secondary | ICD-10-CM

## 2014-05-03 DIAGNOSIS — IMO0001 Reserved for inherently not codable concepts without codable children: Secondary | ICD-10-CM

## 2014-05-03 DIAGNOSIS — I509 Heart failure, unspecified: Secondary | ICD-10-CM

## 2014-05-03 DIAGNOSIS — G250 Essential tremor: Secondary | ICD-10-CM

## 2014-05-03 DIAGNOSIS — G252 Other specified forms of tremor: Secondary | ICD-10-CM

## 2014-05-03 DIAGNOSIS — I5032 Chronic diastolic (congestive) heart failure: Secondary | ICD-10-CM

## 2014-05-03 DIAGNOSIS — D649 Anemia, unspecified: Secondary | ICD-10-CM

## 2014-05-03 DIAGNOSIS — E039 Hypothyroidism, unspecified: Secondary | ICD-10-CM

## 2014-05-03 DIAGNOSIS — I10 Essential (primary) hypertension: Secondary | ICD-10-CM

## 2014-05-03 DIAGNOSIS — E1165 Type 2 diabetes mellitus with hyperglycemia: Secondary | ICD-10-CM

## 2014-05-03 NOTE — Progress Notes (Signed)
Patient ID: [name], female   DOB: Jan 12, 1951, 63 y.o.   MRN: [id number]    Nursing Home Location:  [facility]   Place of Service: SNF (31)  PCP: Kristen Sleeper, MD  Allergies  Allergen Reactions  . Codeine   . Morphine And Related   . Penicillins     Chief Complaint  Patient presents with  . Medical Management of Chronic Issues    HPI:  63 year old female patient seen today for medical management of multiple chronic conditions. Pt with pmh incuding CHF, HTN, DM, and Hypothyroidism. [name] has been in her usual state of health the last month. Her venous stasis and bilateral lower extremity wounds are stable and being followed by wound care. Pt would like her to not have CBGs checked (she refuses frequently)     Review of Systems:  Review of Systems  Constitutional: Negative for fever and chills.  Respiratory: Negative for cough, shortness of breath and wheezing.   Cardiovascular: Positive for leg swelling (STABLE). Negative for chest pain and palpitations.  Gastrointestinal: Negative for heartburn, abdominal pain and constipation.  Genitourinary: Negative for dysuria, urgency and frequency.  Musculoskeletal: Negative for back pain and myalgias.  Skin: Negative.   Neurological: Positive for tremors (STABLE). Negative for headaches.  Psychiatric/Behavioral: Negative for depression. The patient does not have insomnia.      Past Medical History  Diagnosis Date  . Hypertension   . Thyroid disease     Hypothyroidism  . CHF (congestive heart failure)     Status post 2-D echocardiogram 08/12/2010, EF 60-65%, grade 1 diastolic dysfunction  . Epilepsy   . Venous insufficiency   . Morbid obesity   . Venous stasis ulcers   . Essential tremor    Past Surgical History  Procedure Laterality Date  . Bilateral cataract surgery with lens implants    . Right total hip replacement    . Left hip hemiarthroplasty    . Right knee open reduction and  internal fixation of patella fracture    . Abdominal hysterectomy     Social History:   reports that she has never smoked. She has never used smokeless tobacco. She reports that she does not drink alcohol or use illicit drugs.  No family history on file.  Medications: Patient's Medications  New Prescriptions   No medications on file  Previous Medications   ARIPIPRAZOLE (ABILIFY) 2 MG TABLET    Take 2 mg by mouth daily.   BACLOFEN (LIORESAL) 10 MG TABLET    Take 1 tablet (10 mg total) by mouth 3 (three) times daily.   CALCIUM CARBONATE (TUMS - DOSED IN MG ELEMENTAL CALCIUM) 500 MG CHEWABLE TABLET    Chew 1 tablet by mouth daily as needed for heartburn (Pt request).   DIVALPROEX (DEPAKOTE) 500 MG DR TABLET    Take 500 mg by mouth 2 (two) times daily.   FEEDING SUPPLEMENT, GLUCERNA SHAKE, (GLUCERNA SHAKE) LIQD    Take 237 mLs by mouth daily as needed (poor meal intake; encourage supplement if pt consumes <50% of meal).   FUROSEMIDE (LASIX) 20 MG TABLET    Take 20 mg by mouth daily. Takes in the evening   INSULIN LISPRO (HUMALOG) 100 UNIT/ML INJECTION    Inject 10 Units into the skin 3 (three) times daily before meals.   LEVOTHYROXINE (SYNTHROID, LEVOTHROID) 150 MCG TABLET    Take 150 mcg by mouth daily before breakfast.   LISINOPRIL (PRINIVIL,ZESTRIL) 2.5 MG TABLET  Take 1 tablet (2.5 mg total) by mouth daily.   METOPROLOL TARTRATE (LOPRESSOR) 25 MG TABLET    Take 25 mg by mouth daily.   MULTIPLE VITAMIN (MULTIVITAMIN WITH MINERALS) TABS TABLET    Take 1 tablet by mouth daily.   PRIMIDONE (MYSOLINE) 50 MG TABLET    Take 50 mg by mouth at bedtime.   TRAMADOL-ACETAMINOPHEN (ULTRACET) 37.5-325 MG PER TABLET    Take one tablet by mouth every 6 hours as needed for pain  Modified Medications   No medications on file  Discontinued Medications   ACETAMINOPHEN (TYLENOL) 325 MG TABLET    Take 650 mg by mouth every 6 (six) hours as needed for pain (Pain).   SACCHAROMYCES BOULARDII (FLORASTOR) 250  MG CAPSULE    Take 250 mg by mouth daily as needed (diarrhea).     Physical Exam:  Filed Vitals:   05/03/14 1524  BP: 125/59  Pulse: 64  Temp: 97.9 F (36.6 C)  Resp: 18  Physical Exam  Nursing note and vitals reviewed. Constitutional: No distress.  HENT:  Head: Normocephalic and atraumatic.  Right Ear: External ear normal.  Left Ear: External ear normal.  Nose: Nose normal.  Mouth/Throat: Oropharynx is clear and moist. No oropharyngeal exudate.  Eyes: Conjunctivae and EOM are normal. Pupils are equal, round, and reactive to light.  Neck: Normal range of motion. Neck supple.  Cardiovascular: Normal rate and regular rhythm.   Pulmonary/Chest: Effort normal and breath sounds normal. No respiratory distress. She has no wheezes.  Abdominal: Soft. Bowel sounds are normal. She exhibits no distension. There is no tenderness.  Musculoskeletal: She exhibits edema (unchanged, bilateral LE).  Neurological: She is alert.  Skin: Skin is warm and dry. She is not diaphoretic.  Psychiatric: Memory and affect normal.      Labs reviewed: Basic Metabolic Panel:  Recent Labs  78/29/5609/10/30 0350 09/27/13 0340 09/28/13 0512  NA 135 138 138  K 3.7 3.3* 4.1  CL 101 106 104  CO2 25 24 22   GLUCOSE 147* 90 105*  BUN 15 13 14   CREATININE 0.51 0.44* 0.42*  CALCIUM 8.9 8.5 8.6   Liver Function Tests:  Recent Labs  09/25/13 0310 09/27/13 0340 09/28/13 0512  AST 22 39* 36  ALT 5 11 12   ALKPHOS 131* 217* 235*  BILITOT 0.2* 0.1* <0.1*  PROT 5.8* 5.9* 5.7*  ALBUMIN 1.7* 1.6* 1.5*   No results found for this basename: LIPASE, AMYLASE,  in the last 8760 hours  Recent Labs  06/09/13 0420  AMMONIA 15   CBC:  Recent Labs  06/09/13 0135  09/24/13 1040  09/26/13 0350 09/27/13 0340 09/28/13 0512  WBC 11.7*  < > 24.6*  < > 23.4* 19.0* 12.1*  NEUTROABS 9.0*  --  22.4*  --   --   --   --   HGB 15.2*  < > 12.9  < > 11.5* 10.7* 10.6*  HCT 51.0*  < > 39.3  < > 36.1 33.6* 33.7*  MCV  100.6*  < > 91.2  < > 92.3 91.3 91.3  PLT 224  < > 438*  < > 354 371 364  < > = values in this interval not displayed. Cardiac Enzymes:  Recent Labs  06/09/13 0135 06/09/13 0800 06/09/13 1330 09/24/13 1040  CKTOTAL 99  --   --  31  TROPONINI <0.30 <0.30 <0.30  --    BNP: No components found with this basename: POCBNP,  CBG:  Recent Labs  09/28/13 2110 09/29/13  0752 09/29/13 0838  GLUCAP 97 48* 80   TSH:  Recent Labs  06/09/13 0800 09/24/13 1040  TSH 0.075* 0.188*   A1C: Lab Results  Component Value Date   HGBA1C 6.2* 09/24/2013  CBC NO Diff (Complete Blood Count)    Result: 03/31/2014 12:25 PM   ( Status: F )     C WBC 8.3     4.0-10.5 K/uL SLN   RBC 3.90     3.87-5.11 MIL/uL SLN   Hemoglobin 11.0   L 12.0-15.0 g/dL SLN   Hematocrit 16.133.7   L 36.0-46.0 % SLN   MCV 86.4     78.0-100.0 fL SLN   MCH 28.2     26.0-34.0 pg SLN   MCHC 32.6     30.0-36.0 g/dL SLN   RDW 09.617.7   H 04.5-40.911.5-15.5 % SLN   Platelet Count 338     150-400 K/uL SLN   TSH, Ultrasensitive    Result: 03/31/2014 1:06 PM   ( Status: F )       TSH 1.565     0.350-4.500 uIU/mL SLN   Hemoglobin A1C    Result: 03/31/2014 2:55 PM   ( Status: F )       Hemoglobin A1C 5.8   H <5.7 % SLN C Estimated Average Glucose 120   H <117 mg/dL   Assessment/Plan 1. DM (diabetes mellitus), type 2, uncontrolled A1c at goal in April of 5.8, still getting ACHS cbgs and does not like to be stuck and is not requiring insulin will dc novolog and cbgs at this time and cont to monitor by A1c   2. Tremor, essential -stable on primidone  3. Hypertension -conts on metoprolol and lisinopril   4. Chronic diastolic CHF (congestive heart failure) -remains stable on current dose of lasix, will not make changes at this time.  5. Normocytic anemia -Hgb stable on recnet labs  6. Hypothyroidism -TSH stable, conts on synthroid 150  7. Depression -was started on abby psych abilify  they are following her depression

## 2014-05-31 ENCOUNTER — Non-Acute Institutional Stay (SKILLED_NURSING_FACILITY): Payer: Medicaid Other | Admitting: Nurse Practitioner

## 2014-05-31 DIAGNOSIS — I509 Heart failure, unspecified: Secondary | ICD-10-CM

## 2014-05-31 DIAGNOSIS — I10 Essential (primary) hypertension: Secondary | ICD-10-CM

## 2014-05-31 DIAGNOSIS — I5032 Chronic diastolic (congestive) heart failure: Secondary | ICD-10-CM

## 2014-05-31 DIAGNOSIS — F39 Unspecified mood [affective] disorder: Secondary | ICD-10-CM

## 2014-05-31 DIAGNOSIS — G250 Essential tremor: Secondary | ICD-10-CM

## 2014-05-31 DIAGNOSIS — E039 Hypothyroidism, unspecified: Secondary | ICD-10-CM

## 2014-05-31 DIAGNOSIS — G252 Other specified forms of tremor: Secondary | ICD-10-CM

## 2014-05-31 NOTE — Progress Notes (Signed)
Patient ID: [name], female   DOB: 08-31-1951, 63 y.o.   MRN: [id number]    Nursing Home Location:  [facility]   Place of Service: SNF (31)  PCP: Terald SleeperOBSON,[name], MD  Allergies  Allergen Reactions  . Codeine   . Morphine And Related   . Naproxen   . Penicillins     Chief Complaint  Patient presents with  . Medical Management of Chronic Issues    Routine Visit     HPI:  63 year old femalewith pmh of CHF, HTN, DM, Hypothyroidism, chronic venus ulcers followed by wound care. Patient seen today for medical management of multiple chronic conditions. Her venous stasis and bilateral lower extremity wounds are stable.  Pt reports she does not know why they are attempting to give her abilify, reports mood has been good and stable. Reports worsening tremors that is effecting her when she eats and reads.    Review of Systems:  Review of Systems  Constitutional: Negative for fever and chills.  Respiratory: Negative for cough, shortness of breath and wheezing.   Cardiovascular: Positive for leg swelling (STABLE). Negative for chest pain and palpitations.  Gastrointestinal: Negative for heartburn, abdominal pain and constipation.  Genitourinary: Negative for dysuria, urgency and frequency.  Musculoskeletal: Negative for back pain and myalgias.  Skin: Negative.   Neurological: Positive for tremors (has been worse). Negative for headaches.  Psychiatric/Behavioral: Negative for depression and hallucinations. The patient is not nervous/anxious and does not have insomnia.      Past Medical History  Diagnosis Date  . Hypertension   . Thyroid disease     Hypothyroidism  . CHF (congestive heart failure)     Status post 2-D echocardiogram 08/12/2010, EF 60-65%, grade 1 diastolic dysfunction  . Epilepsy   . Venous insufficiency   . Morbid obesity   . Venous stasis ulcers   . Essential tremor    Past Surgical History  Procedure Laterality Date  . Bilateral  cataract surgery with lens implants    . Right total hip replacement    . Left hip hemiarthroplasty    . Right knee open reduction and internal fixation of patella fracture    . Abdominal hysterectomy     Social History:   reports that she has never smoked. She has never used smokeless tobacco. She reports that she does not drink alcohol or use illicit drugs.  No family history on file.  Medications: Patient's Medications  New Prescriptions   No medications on file  Previous Medications   ARIPIPRAZOLE (ABILIFY) 2 MG TABLET    Take 2 mg by mouth daily.   CALCIUM CARBONATE (TUMS - DOSED IN MG ELEMENTAL CALCIUM) 500 MG CHEWABLE TABLET    Chew 1 tablet by mouth daily as needed for heartburn (Pt request).   DIVALPROEX (DEPAKOTE) 500 MG DR TABLET    Take 500 mg by mouth 2 (two) times daily.   FEEDING SUPPLEMENT, GLUCERNA SHAKE, (GLUCERNA SHAKE) LIQD    Take 237 mLs by mouth daily as needed (poor meal intake; encourage supplement if pt consumes <50% of meal).   FUROSEMIDE (LASIX) 20 MG TABLET    Take 20 mg by mouth daily. Takes in the evening   LEVOTHYROXINE (SYNTHROID, LEVOTHROID) 150 MCG TABLET    Take 150 mcg by mouth daily before breakfast.   LISINOPRIL (PRINIVIL,ZESTRIL) 2.5 MG TABLET    Take 1 tablet (2.5 mg total) by mouth daily.   METOPROLOL TARTRATE (LOPRESSOR) 25 MG TABLET  Take 25 mg by mouth daily.   MULTIPLE VITAMINS-MINERALS (CERTAGEN PO)    Take by mouth daily.   PRIMIDONE (MYSOLINE) 50 MG TABLET    Take 50 mg by mouth at bedtime.   TRAMADOL-ACETAMINOPHEN (ULTRACET) 37.5-325 MG PER TABLET    Take one tablet by mouth every 6 hours as needed for pain  Modified Medications   Modified Medication Previous Medication   BACLOFEN (LIORESAL) 10 MG TABLET baclofen (LIORESAL) 10 MG tablet      Take 10 mg by mouth 2 (two) times daily.    Take 1 tablet (10 mg total) by mouth 3 (three) times daily.  Discontinued Medications   MULTIPLE VITAMIN (MULTIVITAMIN WITH MINERALS) TABS TABLET     Take 1 tablet by mouth daily.     Physical Exam:  Filed Vitals:   05/31/14 1432  BP: 100/64  Pulse: 65  Temp: 97.2 F (36.2 C)  TempSrc: Oral  Resp: 16  Weight: 162 lb (73.483 kg)  SpO2: 96%    Physical Exam  Nursing note and vitals reviewed. Constitutional: No distress.  HENT:  Head: Normocephalic and atraumatic.  Right Ear: External ear normal.  Left Ear: External ear normal.  Nose: Nose normal.  Mouth/Throat: Oropharynx is clear and moist. No oropharyngeal exudate.  Eyes: Conjunctivae and EOM are normal. Pupils are equal, round, and reactive to light.  Neck: Normal range of motion. Neck supple.  Cardiovascular: Normal rate and regular rhythm.   Pulmonary/Chest: Effort normal and breath sounds normal. No respiratory distress. She has no wheezes.  Abdominal: Soft. Bowel sounds are normal. She exhibits no distension. There is no tenderness.  Musculoskeletal: She exhibits edema (unchanged, bilateral LE).  Neurological: She is alert. She displays tremor.  Skin: Skin is warm and dry. She is not diaphoretic.  Psychiatric: Memory and affect normal.     Labs reviewed: Basic Metabolic Panel:  Recent Labs  40/98/11 0350 09/27/13 0340 09/28/13 0512  NA 135 138 138  K 3.7 3.3* 4.1  CL 101 106 104  CO2 25 24 22   GLUCOSE 147* 90 105*  BUN 15 13 14   CREATININE 0.51 0.44* 0.42*  CALCIUM 8.9 8.5 8.6   Liver Function Tests:  Recent Labs  09/25/13 0310 09/27/13 0340 09/28/13 0512  AST 22 39* 36  ALT 5 11 12   ALKPHOS 131* 217* 235*  BILITOT 0.2* 0.1* <0.1*  PROT 5.8* 5.9* 5.7*  ALBUMIN 1.7* 1.6* 1.5*   No results found for this basename: LIPASE, AMYLASE,  in the last 8760 hours  Recent Labs  06/09/13 0420  AMMONIA 15   CBC:  Recent Labs  06/09/13 0135  09/24/13 1040  09/26/13 0350 09/27/13 0340 09/28/13 0512  WBC 11.7*  < > 24.6*  < > 23.4* 19.0* 12.1*  NEUTROABS 9.0*  --  22.4*  --   --   --   --   HGB 15.2*  < > 12.9  < > 11.5* 10.7* 10.6*  HCT  51.0*  < > 39.3  < > 36.1 33.6* 33.7*  MCV 100.6*  < > 91.2  < > 92.3 91.3 91.3  PLT 224  < > 438*  < > 354 371 364  < > = values in this interval not displayed. Cardiac Enzymes:  Recent Labs  06/09/13 0135 06/09/13 0800 06/09/13 1330 09/24/13 1040  CKTOTAL 99  --   --  31  TROPONINI <0.30 <0.30 <0.30  --    BNP: No components found with this basename: POCBNP,  CBG:  Recent Labs  09/28/13 2110 09/29/13 0752 09/29/13 0838  GLUCAP 97 48* 80   TSH:  Recent Labs  06/09/13 0800 09/24/13 1040  TSH 0.075* 0.188*   A1C: Lab Results  Component Value Date   HGBA1C 6.2* 09/24/2013   CBC NO Diff (Complete Blood Count)  Result: 03/31/2014 12:25 PM ( Status: F ) C  WBC 8.3 4.0-10.5 K/uL SLN  RBC 3.90 3.87-5.11 MIL/uL SLN  Hemoglobin 11.0 L 12.0-15.0 g/dL SLN  Hematocrit 40.933.7 L 36.0-46.0 % SLN  MCV 86.4 78.0-100.0 fL SLN  MCH 28.2 26.0-34.0 pg SLN  MCHC 32.6 30.0-36.0 g/dL SLN  RDW 81.117.7 H 91.4-78.211.5-15.5 % SLN  Platelet Count 338 150-400 K/uL SLN  TSH, Ultrasensitive  Result: 03/31/2014 1:06 PM ( Status: F )  TSH 1.565 0.350-4.500 uIU/mL SLN  Hemoglobin A1C  Result: 03/31/2014 2:55 PM ( Status: F )  Hemoglobin A1C 5.8 H <5.7 % SLN C  Estimated Average Glucose 120 H <117 mg/dL   Assessment/Plan 1. Tremor, essential -has gotten progressively worse effecting ADLs, will increase primidone to 75 mg daily   2. Essential hypertension -stable on current medications  3. Hypothyroidism, unspecified hypothyroidism type -conts synthroid 150 mcg  4. Chronic diastolic CHF (congestive heart failure) - remains stable, without recent exacerbation   5. Mood disorder -currently on abifiy; pt reports she does not need medication, mood has been stable and she is refusing medication when it is given to her. Will dc at this time and cont to monitor mood

## 2014-06-06 DIAGNOSIS — F39 Unspecified mood [affective] disorder: Secondary | ICD-10-CM | POA: Insufficient documentation

## 2014-06-30 ENCOUNTER — Non-Acute Institutional Stay (SKILLED_NURSING_FACILITY): Payer: Medicaid Other | Admitting: Internal Medicine

## 2014-06-30 DIAGNOSIS — M25552 Pain in left hip: Secondary | ICD-10-CM

## 2014-06-30 DIAGNOSIS — IMO0002 Reserved for concepts with insufficient information to code with codable children: Secondary | ICD-10-CM

## 2014-06-30 DIAGNOSIS — I872 Venous insufficiency (chronic) (peripheral): Secondary | ICD-10-CM

## 2014-06-30 DIAGNOSIS — I878 Other specified disorders of veins: Secondary | ICD-10-CM

## 2014-06-30 DIAGNOSIS — E1165 Type 2 diabetes mellitus with hyperglycemia: Secondary | ICD-10-CM

## 2014-06-30 DIAGNOSIS — M25559 Pain in unspecified hip: Secondary | ICD-10-CM

## 2014-06-30 DIAGNOSIS — IMO0001 Reserved for inherently not codable concepts without codable children: Secondary | ICD-10-CM

## 2014-06-30 NOTE — Progress Notes (Signed)
Patient ID: [name], female   DOB: 1951/01/27, 63 y.o.   MRN: [id number] Facility; [facility] Chief complaint left hip/leg pain. History; the patient requested to see me today with regards to pain in her upper left leg. This is somewhat of an unusual request says she really does not like to see me because I usually insist on looking at the wounds on her lower legs something that I also did today. Patient basically states that sometime earlier this week she was being turned onto her right side in bed using the draw sheet her right left leg fell awkwardly over the left right leg. Since then she has had the unrelenting pain in the upper left leg. The patient is largely immobilized in bed that. She has severe chronic venous insufficiency with recurrent lower extremity ulcerations although not completely compliant with wrap changes. She is largely confined to bed although she does occasionally get up in a wheelchair with a Hoyer lift. There been no other recent falls or injuries that I am aware.  Physical examination Gen. patient is lying in bed in no distress. Respiratory clear entry bilaterally Cardiac heart sounds are normal there is no murmurs. Extremities; the left leg is internally rotated and then valgus at the knee. She seems incapable of moving this. Further assessment revealed the pain to be proximally more in the thigh than the In fact there is palpable tenderness just inferior to the hip joint itself over the anterior aspect of the femur. There is no bruising there is no gross deformity no evidence of a DVT Right leg she has some venous stasis ulcerations here that they're dressing with Aquacel Ag. None of these looks to be about ominous there is no evidence of coexistent infection  Impression/plan Left leg pain. This is occurring in a lady who has very limited use of either leg. Before I give this any further thought she will need an x-ray of the left hip and femur which I will try to  arrange this evening. There is no evidence of a significant vascular abnormality no bruising.   severe venous stasis bilaterally with superficial wounds in the right leg I agree with current treatment Type 2 diabetes. The patient is asking me why we stopped all her diabetes medications and I think this was largely response to hemoglobin A1c in April of 5.8. She often refused CBG testing which made the other types of diabetes treatment very difficult nevertheless I think following this with him a serial hemoglobin A1c would be reasonable. This should be done currently and I will order this

## 2014-07-01 ENCOUNTER — Other Ambulatory Visit: Payer: Self-pay | Admitting: *Deleted

## 2014-07-01 MED ORDER — TRAMADOL-ACETAMINOPHEN 37.5-325 MG PO TABS: ORAL_TABLET | ORAL | Status: DC

## 2014-07-01 NOTE — Telephone Encounter (Signed)
[facility] 

## 2014-07-05 ENCOUNTER — Non-Acute Institutional Stay (SKILLED_NURSING_FACILITY): Payer: Medicaid Other | Admitting: Nurse Practitioner

## 2014-07-05 DIAGNOSIS — E1165 Type 2 diabetes mellitus with hyperglycemia: Secondary | ICD-10-CM

## 2014-07-05 DIAGNOSIS — G40909 Epilepsy, unspecified, not intractable, without status epilepticus: Secondary | ICD-10-CM

## 2014-07-05 DIAGNOSIS — M25552 Pain in left hip: Secondary | ICD-10-CM

## 2014-07-05 DIAGNOSIS — M25559 Pain in unspecified hip: Secondary | ICD-10-CM

## 2014-07-05 DIAGNOSIS — I872 Venous insufficiency (chronic) (peripheral): Secondary | ICD-10-CM

## 2014-07-05 DIAGNOSIS — I878 Other specified disorders of veins: Secondary | ICD-10-CM

## 2014-07-05 DIAGNOSIS — G252 Other specified forms of tremor: Secondary | ICD-10-CM

## 2014-07-05 DIAGNOSIS — IMO0001 Reserved for inherently not codable concepts without codable children: Secondary | ICD-10-CM

## 2014-07-05 DIAGNOSIS — IMO0002 Reserved for concepts with insufficient information to code with codable children: Secondary | ICD-10-CM

## 2014-07-05 DIAGNOSIS — G250 Essential tremor: Secondary | ICD-10-CM

## 2014-07-05 DIAGNOSIS — I10 Essential (primary) hypertension: Secondary | ICD-10-CM

## 2014-07-05 DIAGNOSIS — E039 Hypothyroidism, unspecified: Secondary | ICD-10-CM

## 2014-07-05 MED ORDER — PRIMIDONE 50 MG PO TABS: 75.0000 mg | ORAL_TABLET | Freq: Every day | ORAL | Status: DC

## 2014-07-05 NOTE — Progress Notes (Signed)
Patient ID: [name], female   DOB: [birth year], 63 y.o.   MRN: [id number]    Nursing Home Location:  [facility]   Place of Service: SNF (31)  PCP: Terald Sleeper, MD  Allergies  Allergen Reactions  . Codeine   . Morphine And Related   . Naproxen   . Penicillins     Chief Complaint  Patient presents with  . Medical Management of Chronic Issues    HPI: 63 year old femalewith pmh of CHF, HTN, DM, Hypothyroidism, chronic venus ulcers followed by wound care. Patient seen today for medical management of multiple chronic conditions. Pt was seen last week by Dr Leanord Hawking due to worsening hip pain, no falls or injury noted, Xrays done which were neg for acute finding, was seen regarding her venous stasis and bilateral lower extremity wounds which are stable. Increase in ultracet has helped pain. Tremor has improved with increase in primidone. Pt questions her diabetes, does not want shots however did not allow for blood work to be drawn.     Review of Systems:  Review of Systems  Constitutional: Negative for fever, chills, weight loss and malaise/fatigue.  HENT: Negative for sore throat.   Respiratory: Negative for cough, shortness of breath and wheezing.   Cardiovascular: Positive for leg swelling (unchanged). Negative for chest pain and palpitations.  Gastrointestinal: Negative for heartburn, abdominal pain and constipation.  Genitourinary: Negative for dysuria, urgency and frequency.  Musculoskeletal: Positive for joint pain (improved on medications ). Negative for back pain and myalgias.  Skin: Negative.   Neurological: Positive for tremors (improved). Negative for weakness and headaches.  Psychiatric/Behavioral: Negative for depression and hallucinations. The patient is not nervous/anxious and does not have insomnia.      Past Medical History  Diagnosis Date  . Hypertension   . Thyroid disease     Hypothyroidism  . CHF (congestive heart failure)    Status post 2-D echocardiogram 08/12/2010, EF 60-65%, grade 1 diastolic dysfunction  . Epilepsy   . Venous insufficiency   . Morbid obesity   . Venous stasis ulcers   . Essential tremor    Past Surgical History  Procedure Laterality Date  . Bilateral cataract surgery with lens implants    . Right total hip replacement    . Left hip hemiarthroplasty    . Right knee open reduction and internal fixation of patella fracture    . Abdominal hysterectomy     Social History:   reports that she has never smoked. She has never used smokeless tobacco. She reports that she does not drink alcohol or use illicit drugs.  No family history on file.  Medications: Patient's Medications  New Prescriptions   No medications on file  Previous Medications   ARIPIPRAZOLE (ABILIFY) 2 MG TABLET    Take 2 mg by mouth daily.   BACLOFEN (LIORESAL) 10 MG TABLET    Take 10 mg by mouth 2 (two) times daily.   CALCIUM CARBONATE (TUMS - DOSED IN MG ELEMENTAL CALCIUM) 500 MG CHEWABLE TABLET    Chew 1 tablet by mouth daily as needed for heartburn (Pt request).   DIVALPROEX (DEPAKOTE) 500 MG DR TABLET    Take 500 mg by mouth 2 (two) times daily.   FEEDING SUPPLEMENT, GLUCERNA SHAKE, (GLUCERNA SHAKE) LIQD    Take 237 mLs by mouth daily as needed (poor meal intake; encourage supplement if pt consumes <50% of meal).   FUROSEMIDE (LASIX) 20 MG TABLET    Take 20  mg by mouth daily. Takes in the evening   LEVOTHYROXINE (SYNTHROID, LEVOTHROID) 150 MCG TABLET    Take 150 mcg by mouth daily before breakfast.   LISINOPRIL (PRINIVIL,ZESTRIL) 2.5 MG TABLET    Take 1 tablet (2.5 mg total) by mouth daily.   METOPROLOL TARTRATE (LOPRESSOR) 25 MG TABLET    Take 25 mg by mouth daily.   MULTIPLE VITAMINS-MINERALS (CERTAGEN PO)    Take by mouth daily.   PRIMIDONE (MYSOLINE) 75 MG TABLET    Take 75 mg by mouth at bedtime.   TRAMADOL-ACETAMINOPHEN (ULTRACET) 37.5-325 MG PER TABLET    Take two tablets by mouth every 6 hours as needed for  pain  Modified Medications   No medications on file  Discontinued Medications   No medications on file     Physical Exam: Physical Exam  Nursing note and vitals reviewed. Constitutional: No distress.  HENT:  Head: Normocephalic and atraumatic.  Right Ear: External ear normal.  Left Ear: External ear normal.  Nose: Nose normal.  Mouth/Throat: Oropharynx is clear and moist. No oropharyngeal exudate.  Eyes: Conjunctivae and EOM are normal. Pupils are equal, round, and reactive to light.  Neck: Normal range of motion. Neck supple.  Cardiovascular: Normal rate and regular rhythm.   Pulmonary/Chest: Effort normal and breath sounds normal. No respiratory distress. She has no wheezes.  Abdominal: Soft. Bowel sounds are normal. She exhibits no distension. There is no tenderness.  Musculoskeletal: She exhibits edema (unchanged, bilateral LE).  Neurological: She is alert. She displays tremor.  Skin: Skin is warm and dry. She is not diaphoretic.  Psychiatric: Memory and affect normal.     Filed Vitals:   07/05/14 1207  BP: 140/60  Pulse: 76  Temp: 97.6 F (36.4 C)  Resp: 20      Labs reviewed: Basic Metabolic Panel:  Recent Labs  96/03/5409/11/14 0350 09/27/13 0340 09/28/13 0512  NA 135 138 138  K 3.7 3.3* 4.1  CL 101 106 104  CO2 25 24 22   GLUCOSE 147* 90 105*  BUN 15 13 14   CREATININE 0.51 0.44* 0.42*  CALCIUM 8.9 8.5 8.6   Liver Function Tests:  Recent Labs  09/25/13 0310 09/27/13 0340 09/28/13 0512  AST 22 39* 36  ALT 5 11 12   ALKPHOS 131* 217* 235*  BILITOT 0.2* 0.1* <0.1*  PROT 5.8* 5.9* 5.7*  ALBUMIN 1.7* 1.6* 1.5*   No results found for this basename: LIPASE, AMYLASE,  in the last 8760 hours No results found for this basename: AMMONIA,  in the last 8760 hours CBC:  Recent Labs  09/24/13 1040  09/26/13 0350 09/27/13 0340 09/28/13 0512  WBC 24.6*  < > 23.4* 19.0* 12.1*  NEUTROABS 22.4*  --   --   --   --   HGB 12.9  < > 11.5* 10.7* 10.6*  HCT 39.3   < > 36.1 33.6* 33.7*  MCV 91.2  < > 92.3 91.3 91.3  PLT 438*  < > 354 371 364  < > = values in this interval not displayed. Cardiac Enzymes:  Recent Labs  09/24/13 1040  CKTOTAL 31   BNP: No components found with this basename: POCBNP,  CBG:  Recent Labs  09/28/13 2110 09/29/13 0752 09/29/13 0838  GLUCAP 97 48* 80   TSH:  Recent Labs  09/24/13 1040  TSH 0.188*   A1C: Lab Results  Component Value Date   HGBA1C 6.2* 09/24/2013   CBC NO Diff (Complete Blood Count)  Result: 03/31/2014 12:25 PM (  Status: F ) C  WBC 8.3 4.0-10.5 K/uL SLN  RBC 3.90 3.87-5.11 MIL/uL SLN  Hemoglobin 11.0 L 12.0-15.0 g/dL SLN  Hematocrit 40.9 L 36.0-46.0 % SLN  MCV 86.4 78.0-100.0 fL SLN  MCH 28.2 26.0-34.0 pg SLN  MCHC 32.6 30.0-36.0 g/dL SLN  RDW 81.1 H 91.4-78.2 % SLN  Platelet Count 338 150-400 K/uL SLN  TSH, Ultrasensitive  Result: 03/31/2014 1:06 PM ( Status: F )  TSH 1.565 0.350-4.500 uIU/mL SLN  Hemoglobin A1C  Result: 03/31/2014 2:55 PM ( Status: F )  Hemoglobin A1C 5.8 H <5.7 % SLN C  Estimated Average Glucose 120 H <117 mg/dL    Assessment/Plan 1. Hypothyroidism, unspecified hypothyroidism type Will follow up tsh  2. Essential hypertension Stable at this time   3. Seizure disorder conts on depakene, valproic acid level ordered but not done, will re-order at this time.   4. Tremor, essential Has improved with increase in primidone   5. DM (diabetes mellitus), type 2, uncontrolled -off medication and cbg checks at this time. Last A1c 5.8  -pt was seen by Dr Leanord Hawking and A1c ordered however pt did not allow for bood draw - will reorder at this time   6. Lower extremity venous stasis -stable, conts current treatment   7. Pain in left hip -xray was negative, pt does not want additional services or evaluation at this time, reports increase in ultracet has been adequate for pain control.

## 2014-07-06 LAB — CBC AND DIFFERENTIAL
HCT: 36 % (ref 36–46)
Hemoglobin: 11.3 g/dL — AB (ref 12.0–16.0)
PLATELETS: 328 10*3/uL (ref 150–399)
WBC: 8.1 10*3/mL

## 2014-07-06 LAB — TSH: TSH: 3.71 u[IU]/mL (ref 0.41–5.90)

## 2014-07-06 LAB — HEMOGLOBIN A1C: Hgb A1c MFr Bld: 5.6 % (ref 4.0–6.0)

## 2014-07-23 DIAGNOSIS — I10 Essential (primary) hypertension: Secondary | ICD-10-CM | POA: Diagnosis not present

## 2014-07-23 LAB — HEPATIC FUNCTION PANEL
ALK PHOS: 110 U/L (ref 25–125)
ALT: 13 U/L (ref 7–35)
AST: 20 U/L (ref 13–35)
BILIRUBIN, TOTAL: 0.1 mg/dL

## 2014-07-23 LAB — BASIC METABOLIC PANEL
BUN: 31 mg/dL — AB (ref 4–21)
Creatinine: 0.6 mg/dL (ref 0.5–1.1)
GLUCOSE: 103 mg/dL
POTASSIUM: 5.9 mmol/L — AB (ref 3.4–5.3)
Sodium: 133 mmol/L — AB (ref 137–147)

## 2014-07-24 DIAGNOSIS — I10 Essential (primary) hypertension: Secondary | ICD-10-CM | POA: Diagnosis not present

## 2014-07-25 DIAGNOSIS — E876 Hypokalemia: Secondary | ICD-10-CM | POA: Diagnosis not present

## 2014-07-26 DIAGNOSIS — E875 Hyperkalemia: Secondary | ICD-10-CM | POA: Diagnosis not present

## 2014-07-27 DIAGNOSIS — E875 Hyperkalemia: Secondary | ICD-10-CM | POA: Diagnosis not present

## 2014-07-28 DIAGNOSIS — I10 Essential (primary) hypertension: Secondary | ICD-10-CM | POA: Diagnosis not present

## 2014-07-30 ENCOUNTER — Non-Acute Institutional Stay (SKILLED_NURSING_FACILITY): Payer: Medicare Other | Admitting: Nurse Practitioner

## 2014-07-30 DIAGNOSIS — B372 Candidiasis of skin and nail: Secondary | ICD-10-CM | POA: Diagnosis not present

## 2014-07-30 DIAGNOSIS — E875 Hyperkalemia: Secondary | ICD-10-CM

## 2014-07-30 DIAGNOSIS — IMO0002 Reserved for concepts with insufficient information to code with codable children: Secondary | ICD-10-CM

## 2014-07-30 DIAGNOSIS — G8929 Other chronic pain: Secondary | ICD-10-CM

## 2014-07-30 DIAGNOSIS — E1165 Type 2 diabetes mellitus with hyperglycemia: Secondary | ICD-10-CM

## 2014-07-30 DIAGNOSIS — IMO0001 Reserved for inherently not codable concepts without codable children: Secondary | ICD-10-CM

## 2014-07-30 NOTE — Progress Notes (Signed)
Patient ID: [name], female   DOB: 08/18/1951, 63 y.o.   MRN: [id number]    Nursing Home Location:  [facility]   Place of Service: SNF (31)  PCP: Terald Sleeper, MD  Allergies  Allergen Reactions  . Codeine   . Morphine And Related   . Naproxen   . Penicillins     Chief Complaint  Patient presents with  . Medical Management of Chronic Issues    Routine Visit     HPI:  63 year old female with pmh of CHF, HTN, DM, Hypothyroidism, chronic venus ulcers followed by wound care. Patient seen today for medical management of multiple chronic conditions. In the last month pt has undergone multiple lab work due to elevated potassium. Lisinopril was then stopped and potassium has now normalized with latest labs. Pt reports she is having pain in her right arm and would like to have something for it. No injury noted it is just sore.    Review of Systems:  Review of Systems  Constitutional: Negative for fever, chills, weight loss and malaise/fatigue.  HENT: Negative for sore throat.   Respiratory: Negative for cough, shortness of breath and wheezing.   Cardiovascular: Positive for leg swelling (unchanged). Negative for chest pain and palpitations.  Gastrointestinal: Negative for heartburn, abdominal pain and constipation.  Genitourinary: Negative for dysuria, urgency and frequency.  Musculoskeletal: Positive for joint pain (now with worsening shoulder and arm pain). Negative for back pain and myalgias.  Neurological: Positive for tremors (improved). Negative for weakness and headaches.  Psychiatric/Behavioral: Negative for depression and hallucinations. The patient is not nervous/anxious and does not have insomnia.      Past Medical History  Diagnosis Date  . Hypertension   . Thyroid disease     Hypothyroidism  . CHF (congestive heart failure)     Status post 2-D echocardiogram 08/12/2010, EF 60-65%, grade 1 diastolic dysfunction  . Epilepsy   . Venous  insufficiency   . Morbid obesity   . Venous stasis ulcers   . Essential tremor    Past Surgical History  Procedure Laterality Date  . Bilateral cataract surgery with lens implants    . Right total hip replacement    . Left hip hemiarthroplasty    . Right knee open reduction and internal fixation of patella fracture    . Abdominal hysterectomy     Social History:   reports that she has never smoked. She has never used smokeless tobacco. She reports that she does not drink alcohol or use illicit drugs.  No family history on file.  Medications: Patient's Medications  New Prescriptions   No medications on file  Previous Medications   BACLOFEN (LIORESAL) 10 MG TABLET    Take 10 mg by mouth 2 (two) times daily. For tremors   CALCIUM CARBONATE (TUMS - DOSED IN MG ELEMENTAL CALCIUM) 500 MG CHEWABLE TABLET    Chew 1 tablet by mouth daily as needed for heartburn (Pt request).   DIVALPROEX (DEPAKOTE) 500 MG DR TABLET    Take 500 mg by mouth 2 (two) times daily. For seizure disorder   FUROSEMIDE (LASIX) 20 MG TABLET    Take 20 mg by mouth daily. Takes in the evening   LACTOSE FREE NUTRITION (BOOST) LIQD    Take 237 mLs by mouth daily.   LEVOTHYROXINE (SYNTHROID, LEVOTHROID) 150 MCG TABLET    Take 150 mcg by mouth daily before breakfast. For hypothyroidism   METOPROLOL TARTRATE (LOPRESSOR) 25 MG TABLET  Take 25 mg by mouth daily.   MULTIPLE VITAMINS-MINERALS (CERTAGEN PO)    Take by mouth daily.   PRIMIDONE (MYSOLINE) 50 MG TABLET    Take 1.5 tablets (75 mg total) by mouth at bedtime.   TRAMADOL-ACETAMINOPHEN (ULTRACET) 37.5-325 MG PER TABLET    Take two tablets by mouth every 6 hours as needed for pain  Modified Medications   Modified Medication Previous Medication   LISINOPRIL (PRINIVIL,ZESTRIL) 2.5 MG TABLET lisinopril (PRINIVIL,ZESTRIL) 2.5 MG tablet      Take 2.5 mg by mouth daily. For hypertension    Take 1 tablet (2.5 mg total) by mouth daily.  Discontinued Medications   FEEDING  SUPPLEMENT, GLUCERNA SHAKE, (GLUCERNA SHAKE) LIQD    Take 237 mLs by mouth daily as needed (poor meal intake; encourage supplement if pt consumes <50% of meal).     Physical Exam:  Filed Vitals:   07/30/14 1134  BP: 108/62  Pulse: 86  Temp: 98.1 F (36.7 C)  TempSrc: Oral  Resp: 20  Weight: 156 lb (70.761 kg)  SpO2: 96%    Physical Exam  Nursing note and vitals reviewed. Constitutional: No distress.  HENT:  Head: Normocephalic and atraumatic.  Right Ear: External ear normal.  Left Ear: External ear normal.  Nose: Nose normal.  Mouth/Throat: Oropharynx is clear and moist. No oropharyngeal exudate.  Eyes: Conjunctivae and EOM are normal. Pupils are equal, round, and reactive to light.  Neck: Normal range of motion. Neck supple.  Cardiovascular: Normal rate and regular rhythm.   Pulmonary/Chest: Effort normal and breath sounds normal. No respiratory distress. She has no wheezes.  Abdominal: Soft. Bowel sounds are normal. She exhibits no distension. There is no tenderness.  Musculoskeletal: She exhibits edema (unchanged, bilateral LE).  Neurological: She is alert. She displays tremor.  Skin: Skin is warm and dry. Rash (large beefy red rash to medial left thigh from brief to above knee) noted. She is not diaphoretic.  Psychiatric: Memory and affect normal.     Labs reviewed: Basic Metabolic Panel:  Recent Labs  30/86/57 0350 09/27/13 0340 09/28/13 0512 07/23/14 1310  NA 135 138 138 133*  K 3.7 3.3* 4.1 5.9*  CL 101 106 104  --   CO2 25 24 22   --   GLUCOSE 147* 90 105*  --   BUN 15 13 14  31*  CREATININE 0.51 0.44* 0.42* 0.6  CALCIUM 8.9 8.5 8.6  --    Liver Function Tests:  Recent Labs  09/25/13 0310 09/27/13 0340 09/28/13 0512 07/23/14 1310  AST 22 39* 36 20  ALT 5 11 12 13   ALKPHOS 131* 217* 235* 110  BILITOT 0.2* 0.1* <0.1*  --   PROT 5.8* 5.9* 5.7*  --   ALBUMIN 1.7* 1.6* 1.5*  --    No results found for this basename: LIPASE, AMYLASE,  in the  last 8760 hours No results found for this basename: AMMONIA,  in the last 8760 hours CBC:  Recent Labs  09/24/13 1040  09/26/13 0350 09/27/13 0340 09/28/13 0512 07/06/14 0230  WBC 24.6*  < > 23.4* 19.0* 12.1* 8.1  NEUTROABS 22.4*  --   --   --   --   --   HGB 12.9  < > 11.5* 10.7* 10.6* 11.3*  HCT 39.3  < > 36.1 33.6* 33.7* 36  MCV 91.2  < > 92.3 91.3 91.3  --   PLT 438*  < > 354 371 364 328  < > = values in this interval not  displayed. Cardiac Enzymes:  Recent Labs  09/24/13 1040  CKTOTAL 31   BNP: No components found with this basename: POCBNP,  CBG:  Recent Labs  09/28/13 2110 09/29/13 0752 09/29/13 0838  GLUCAP 97 48* 80   TSH:  Recent Labs  09/24/13 1040 07/06/14 0230  TSH 0.188* 3.71   A1C: Lab Results  Component Value Date   HGBA1C 5.6 07/06/2014   Basic Metabolic Panel    Result: 07/28/2014 10:43 AM   ( Status: F )     C Sodium 135     135-145 mEq/L SLN   Potassium 4.8     3.5-5.3 mEq/L SLN   Chloride 96     96-112 mEq/L SLN   CO2 26     19-32 mEq/L SLN   Glucose 69   L 70-99 mg/dL SLN   BUN 28   H 1-616-23 mg/dL SLN   Creatinine 0.960.66     0.50-1.10 mg/dL SLN   Calcium 8.7     0.4-54.08.4-10.5 mg/dL SLN   Assessment/Plan 1. DM (diabetes mellitus), type 2, uncontrolled -a1c at goal, diet controlled -lisinopril stopped due to hyperkalemia now potassium has improved   2. Chronic pain Worsening right shoulder pain, will add biofreeze at this time   3. Hyperkalemia -will recheck BMP at this time  4. Yeast infection of the skin -pt will freq decline care, spoke with her in great detail about the importance of proper hygiene and letting the staff take care of her appropriately.  Will start diflucan 100 mg daily for 7 days -nystatin cream TID until resolved

## 2014-08-02 ENCOUNTER — Non-Acute Institutional Stay (SKILLED_NURSING_FACILITY): Payer: Medicare Other | Admitting: Nurse Practitioner

## 2014-08-02 DIAGNOSIS — B372 Candidiasis of skin and nail: Secondary | ICD-10-CM | POA: Diagnosis not present

## 2014-08-02 DIAGNOSIS — I10 Essential (primary) hypertension: Secondary | ICD-10-CM | POA: Diagnosis not present

## 2014-08-02 DIAGNOSIS — IMO0001 Reserved for inherently not codable concepts without codable children: Secondary | ICD-10-CM | POA: Diagnosis not present

## 2014-08-02 DIAGNOSIS — M791 Myalgia, unspecified site: Secondary | ICD-10-CM

## 2014-08-02 NOTE — Progress Notes (Signed)
Patient ID: [name], female   DOB: 26-Mar-1951, 63 y.o.   MRN: [id number]    [facility]ursing [facility]   Place of Service: SNF (31)  PCP: Terald SleeperOBSON,[name], MD  Allergies  Allergen Reactions  . Codeine   . Morphine And Related   . Naproxen   . Penicillins     Chief Complaint  Patient presents with  . Medical Management of Chronic Issues    HPI: 63 year old femalewith pmh of CHF, HTN, DM, Hypothyroidism, chronic venus ulcers followed by wound care. Patient seen today for follow up on yeast rash to left leg. Pt was prescribed nystatin cream to affected area TID and diflucan for 7 days. Pt reports she does not wish to take any of those pills so has refused pills. Allows for cream to be applied at times. Overall area has improved.  Would like biofreeze to right outer thigh due to ongoing pain  Review of Systems:  Review of Systems  Constitutional: Negative for fever, chills, weight loss and malaise/fatigue.  HENT: Negative for sore throat.   Respiratory: Negative for cough, shortness of breath and wheezing.   Cardiovascular: Positive for leg swelling (unchanged). Negative for chest pain and palpitations.  Gastrointestinal: Negative for abdominal pain, diarrhea and constipation.  Genitourinary: Negative for dysuria, urgency and frequency.  Musculoskeletal: Positive for joint pain (improved on medications ). Negative for back pain and myalgias.  Skin: Positive for rash. Negative for itching.  Neurological: Negative for weakness and headaches.     Past Medical History  Diagnosis Date  . Hypertension   . Thyroid disease     Hypothyroidism  . CHF (congestive heart failure)     Status post 2-D echocardiogram 08/12/2010, EF 60-65%, grade 1 diastolic dysfunction  . Epilepsy   . Venous insufficiency   . Morbid obesity   . Venous stasis ulcers   . Essential tremor    Past Surgical History  Procedure Laterality Date  . Bilateral cataract surgery  with lens implants    . Right total hip replacement    . Left hip hemiarthroplasty    . Right knee open reduction and internal fixation of patella fracture    . Abdominal hysterectomy     Social History:   reports that she has never smoked. She has never used smokeless tobacco. She reports that she does not drink alcohol or use illicit drugs.  No family history on file.  Medications: Patient's Medications  New Prescriptions   No medications on file  Previous Medications   ARIPIPRAZOLE (ABILIFY) 2 MG TABLET    Take 2 mg by mouth daily.   BACLOFEN (LIORESAL) 10 MG TABLET    Take 10 mg by mouth 2 (two) times daily.   CALCIUM CARBONATE (TUMS - DOSED IN MG ELEMENTAL CALCIUM) 500 MG CHEWABLE TABLET    Chew 1 tablet by mouth daily as needed for heartburn (Pt request).   DIVALPROEX (DEPAKOTE) 500 MG DR TABLET    Take 500 mg by mouth 2 (two) times daily.   FEEDING SUPPLEMENT, GLUCERNA SHAKE, (GLUCERNA SHAKE) LIQD    Take 237 mLs by mouth daily as needed (poor meal intake; encourage supplement if pt consumes <50% of meal).   FUROSEMIDE (LASIX) 20 MG TABLET    Take 20 mg by mouth daily. Takes in the evening   LEVOTHYROXINE (SYNTHROID, LEVOTHROID) 150 MCG TABLET    Take 150 mcg by mouth daily before breakfast.   LISINOPRIL (PRINIVIL,ZESTRIL) 2.5 MG TABLET  Take 1 tablet (2.5 mg total) by mouth daily.   METOPROLOL TARTRATE (LOPRESSOR) 25 MG TABLET    Take 25 mg by mouth daily.   MULTIPLE VITAMINS-MINERALS (CERTAGEN PO)    Take by mouth daily.   PRIMIDONE (MYSOLINE) 75 MG TABLET    Take 75 mg by mouth at bedtime.   TRAMADOL-ACETAMINOPHEN (ULTRACET) 37.5-325 MG PER TABLET    Take two tablets by mouth every 6 hours as needed for pain  Modified Medications   No medications on file  Discontinued Medications   No medications on file     Physical Exam: Physical Exam  Nursing note and vitals reviewed. Constitutional: No distress.  HENT:  Head: Normocephalic and atraumatic.  Right Ear: External  ear normal.  Left Ear: External ear normal.  Nose: Nose normal.  Mouth/Throat: Oropharynx is clear and moist. No oropharyngeal exudate.  Eyes: Conjunctivae and EOM are normal. Pupils are equal, round, and reactive to light.  Neck: Normal range of motion. Neck supple.  Cardiovascular: Normal rate and regular rhythm.   Pulmonary/Chest: Effort normal and breath sounds normal. No respiratory distress. She has no wheezes.  Abdominal: Soft. Bowel sounds are normal. She exhibits no distension. There is no tenderness.  Musculoskeletal: She exhibits edema (unchanged, bilateral LE).  Neurological: She is alert. She displays tremor.  Skin: Skin is warm. Rash (lighten yeast rash to left inner thigh- large area still affected, no signs of infection, drainage or odor  or swelling noted) noted. She is not diaphoretic. No erythema.  Psychiatric: Memory and affect normal.     Filed Vitals:   08/02/14 1702  BP: 125/73  Pulse: 81  Temp: 97.5 F (36.4 C)  Resp: 20      Labs reviewed: Basic Metabolic Panel:  Recent Labs  40/98/11 0350 09/27/13 0340 09/28/13 0512 07/23/14 1310  NA 135 138 138 133*  K 3.7 3.3* 4.1 5.9*  CL 101 106 104  --   CO2 25 24 22   --   GLUCOSE 147* 90 105*  --   BUN 15 13 14  31*  CREATININE 0.51 0.44* 0.42* 0.6  CALCIUM 8.9 8.5 8.6  --    Liver Function Tests:  Recent Labs  09/25/13 0310 09/27/13 0340 09/28/13 0512 07/23/14 1310  AST 22 39* 36 20  ALT 5 11 12 13   ALKPHOS 131* 217* 235* 110  BILITOT 0.2* 0.1* <0.1*  --   PROT 5.8* 5.9* 5.7*  --   ALBUMIN 1.7* 1.6* 1.5*  --    No results found for this basename: LIPASE, AMYLASE,  in the last 8760 hours No results found for this basename: AMMONIA,  in the last 8760 hours CBC:  Recent Labs  09/24/13 1040  09/26/13 0350 09/27/13 0340 09/28/13 0512 07/06/14 0230  WBC 24.6*  < > 23.4* 19.0* 12.1* 8.1  NEUTROABS 22.4*  --   --   --   --   --   HGB 12.9  < > 11.5* 10.7* 10.6* 11.3*  HCT 39.3  < > 36.1  33.6* 33.7* 36  MCV 91.2  < > 92.3 91.3 91.3  --   PLT 438*  < > 354 371 364 328  < > = values in this interval not displayed. Cardiac Enzymes:  Recent Labs  09/24/13 1040  CKTOTAL 31   BNP: No components found with this basename: POCBNP,  CBG:  Recent Labs  09/28/13 2110 09/29/13 0752 09/29/13 0838  GLUCAP 97 48* 80   TSH:  Recent Labs  09/24/13  1040 07/06/14 0230  TSH 0.188* 3.71   A1C: Lab Results  Component Value Date   HGBA1C 5.6 07/06/2014   CBC NO Diff (Complete Blood Count)  Result: 03/31/2014 12:25 PM ( Status: F ) C  WBC 8.3 4.0-10.5 K/uL SLN  RBC 3.90 3.87-5.11 MIL/uL SLN  Hemoglobin 11.0 L 12.0-15.0 g/dL SLN  Hematocrit 16.1 L 36.0-46.0 % SLN  MCV 86.4 78.0-100.0 fL SLN  MCH 28.2 26.0-34.0 pg SLN  MCHC 32.6 30.0-36.0 g/dL SLN  RDW 09.6 H 04.5-40.9 % SLN  Platelet Count 338 150-400 K/uL SLN  TSH, Ultrasensitive  Result: 03/31/2014 1:06 PM ( Status: F )  TSH 1.565 0.350-4.500 uIU/mL SLN  Hemoglobin A1C  Result: 03/31/2014 2:55 PM ( Status: F )  Hemoglobin A1C 5.8 H <5.7 % SLN C  Estimated Average Glucose 120 H <117 mg/dL    Assessment/Plan  1. Candidal skin infection Improved, cont nystatin cream until resolved -has agreed to take diflucan 100 mg daily for 4 days, will restart today  2. Muscle pain -will have staff apply biofreeze to left lateral thigh bid

## 2014-08-11 DIAGNOSIS — R633 Feeding difficulties, unspecified: Secondary | ICD-10-CM | POA: Diagnosis not present

## 2014-08-11 DIAGNOSIS — R1311 Dysphagia, oral phase: Secondary | ICD-10-CM | POA: Diagnosis not present

## 2014-08-11 DIAGNOSIS — R293 Abnormal posture: Secondary | ICD-10-CM | POA: Diagnosis not present

## 2014-08-11 DIAGNOSIS — M6281 Muscle weakness (generalized): Secondary | ICD-10-CM | POA: Diagnosis not present

## 2014-08-12 DIAGNOSIS — R633 Feeding difficulties, unspecified: Secondary | ICD-10-CM | POA: Diagnosis not present

## 2014-08-12 DIAGNOSIS — M6281 Muscle weakness (generalized): Secondary | ICD-10-CM | POA: Diagnosis not present

## 2014-08-12 DIAGNOSIS — R1311 Dysphagia, oral phase: Secondary | ICD-10-CM | POA: Diagnosis not present

## 2014-08-12 DIAGNOSIS — R293 Abnormal posture: Secondary | ICD-10-CM | POA: Diagnosis not present

## 2014-08-13 DIAGNOSIS — R1311 Dysphagia, oral phase: Secondary | ICD-10-CM | POA: Diagnosis not present

## 2014-08-13 DIAGNOSIS — M6281 Muscle weakness (generalized): Secondary | ICD-10-CM | POA: Diagnosis not present

## 2014-08-13 DIAGNOSIS — R293 Abnormal posture: Secondary | ICD-10-CM | POA: Diagnosis not present

## 2014-08-13 DIAGNOSIS — R633 Feeding difficulties, unspecified: Secondary | ICD-10-CM | POA: Diagnosis not present

## 2014-08-16 DIAGNOSIS — R1311 Dysphagia, oral phase: Secondary | ICD-10-CM | POA: Diagnosis not present

## 2014-08-16 DIAGNOSIS — R633 Feeding difficulties, unspecified: Secondary | ICD-10-CM | POA: Diagnosis not present

## 2014-08-16 DIAGNOSIS — M6281 Muscle weakness (generalized): Secondary | ICD-10-CM | POA: Diagnosis not present

## 2014-08-16 DIAGNOSIS — R293 Abnormal posture: Secondary | ICD-10-CM | POA: Diagnosis not present

## 2014-08-17 DIAGNOSIS — R293 Abnormal posture: Secondary | ICD-10-CM | POA: Diagnosis not present

## 2014-08-31 ENCOUNTER — Non-Acute Institutional Stay (SKILLED_NURSING_FACILITY): Payer: Medicare Other | Admitting: Internal Medicine

## 2014-08-31 DIAGNOSIS — M79609 Pain in unspecified limb: Secondary | ICD-10-CM | POA: Diagnosis not present

## 2014-08-31 DIAGNOSIS — M25559 Pain in unspecified hip: Secondary | ICD-10-CM | POA: Diagnosis not present

## 2014-08-31 DIAGNOSIS — M79604 Pain in right leg: Secondary | ICD-10-CM

## 2014-09-06 NOTE — Progress Notes (Addendum)
Patient ID: [name], female   DOB: Jun 02, 1951, 63 y.o.   MRN: [id number]               PROGRESS NOTE  DATE:  08/31/2014    FACILITY: [facility]    LEVEL OF CARE:   SNF   Acute Visit   CHIEF COMPLAINT:  Severe right hip pain.    HISTORY OF PRESENT ILLNESS:  [name] is a largely bedbound patient.  The reason for this has never been completely clear to me.  The patient states this was from trauma she suffered in the 85s.  Ever since I have known her, she has been in a wheelchair.  As she has become increasingly bedbound, her legs have become less useful.  She is contractured, but has no sensory levels.  She can still move her feet minimally.    She has been complaining over the last there weeks of apparently right hip pain which she says happened while she was transferring.  She felt a "pop" and she has had severe pain since then.  I saw her for left hip pain in April of this year with a similar history.  X-rays showed severe degenerative arthritis, but no fracture.    REVIEW OF SYSTEMS:   MUSCULOSKELETAL:  She does not complain of back pain.   NEUROLOGICAL:   No sensory level.   GU:  No change in bowel habits.     PHYSICAL EXAMINATION:   GENERAL APPEARANCE:  The patient is lying in bed, as usual.   MUSCULOSKELETAL:   EXTREMITIES:   LEFT LOWER EXTREMITY:  In the left hip, she has varus at the left hip, valgus at the left knee.   RIGHT LOWER EXTREMITY:  She is exorotated on the right hip.  In discussing things with her, it is fairly clear to me that the pain is not really in her right hip but proximal right femur.  This will demand x-rays.  There is no bruising that I can see.  She would not really roll over but, according to the wound care nurse, there are no wounds in the area.   CIRCULATION:  EDEMA/VARICOSITIES:  Lower extremities:  She has a history of severe lymphedema and recurrent venous stasis.  She is less than 100% cooperative with dressing changes.  She has recurrent  wounds in her legs, I think related to probably severe venous stasis, scar tissue, etc.    ASSESSMENT/PLAN:  Right leg pain.  I will x-ray the right hip and femur.

## 2014-09-07 DIAGNOSIS — F39 Unspecified mood [affective] disorder: Secondary | ICD-10-CM | POA: Diagnosis not present

## 2014-09-07 DIAGNOSIS — F329 Major depressive disorder, single episode, unspecified: Secondary | ICD-10-CM | POA: Diagnosis not present

## 2014-09-07 DIAGNOSIS — F3289 Other specified depressive episodes: Secondary | ICD-10-CM | POA: Diagnosis not present

## 2014-09-16 DIAGNOSIS — M6281 Muscle weakness (generalized): Secondary | ICD-10-CM | POA: Diagnosis not present

## 2014-09-16 DIAGNOSIS — R1311 Dysphagia, oral phase: Secondary | ICD-10-CM | POA: Diagnosis not present

## 2014-09-16 DIAGNOSIS — R293 Abnormal posture: Secondary | ICD-10-CM | POA: Diagnosis not present

## 2014-09-16 DIAGNOSIS — R633 Feeding difficulties: Secondary | ICD-10-CM | POA: Diagnosis not present

## 2014-09-16 DIAGNOSIS — R488 Other symbolic dysfunctions: Secondary | ICD-10-CM | POA: Diagnosis not present

## 2014-09-20 ENCOUNTER — Non-Acute Institutional Stay (SKILLED_NURSING_FACILITY): Payer: Medicare Other | Admitting: Nurse Practitioner

## 2014-09-20 DIAGNOSIS — I10 Essential (primary) hypertension: Secondary | ICD-10-CM

## 2014-09-20 DIAGNOSIS — G8929 Other chronic pain: Secondary | ICD-10-CM

## 2014-09-20 DIAGNOSIS — G250 Essential tremor: Secondary | ICD-10-CM | POA: Diagnosis not present

## 2014-09-20 DIAGNOSIS — I5032 Chronic diastolic (congestive) heart failure: Secondary | ICD-10-CM | POA: Diagnosis not present

## 2014-09-20 NOTE — Progress Notes (Signed)
Patient ID: [name], female   DOB: 26-Jan-1951, 63 y.o.   MRN: [id number]    Nursing Home Location:  [facility]   Place of Service: SNF (31)  PCP: Terald Sleeper, MD  Allergies  Allergen Reactions  . Codeine   . Morphine And Related   . Naproxen   . Penicillins     Chief Complaint  Patient presents with  . Medical Management of Chronic Issues    HPI: 63 year old femalewith pmh of CHF, HTN, DM, Hypothyroidism, chronic venus ulcers followed by wound care. Patient seen today for follow up on chronic conditions. Pt still complaining of leg pain however this has improved. Pt was recently started on cymbalta by psych to help with pain and depression. Pt is very non-complaint with medications and personal care which is not new.  Review of Systems:  Review of Systems  Constitutional: Negative for fever, chills, weight loss and malaise/fatigue.  HENT: Negative for sore throat.   Respiratory: Negative for cough, shortness of breath and wheezing.   Cardiovascular: Positive for leg swelling (unchanged). Negative for chest pain and palpitations.  Gastrointestinal: Negative for abdominal pain, diarrhea and constipation.  Genitourinary: Negative for dysuria, urgency and frequency.  Musculoskeletal: Positive for joint pain (improved on medications ). Negative for back pain and myalgias.  Skin: Negative for itching and rash.  Neurological: Negative for weakness and headaches.     Past Medical History  Diagnosis Date  . Hypertension   . Thyroid disease     Hypothyroidism  . CHF (congestive heart failure)     Status post 2-D echocardiogram 08/12/2010, EF 60-65%, grade 1 diastolic dysfunction  . Epilepsy   . Venous insufficiency   . Morbid obesity   . Venous stasis ulcers   . Essential tremor    Past Surgical History  Procedure Laterality Date  . Bilateral cataract surgery with lens implants    . Right total hip replacement    . Left hip hemiarthroplasty      . Right knee open reduction and internal fixation of patella fracture    . Abdominal hysterectomy     Social History:   reports that she has never smoked. She has never used smokeless tobacco. She reports that she does not drink alcohol or use illicit drugs.  No family history on file.  Medications: Patient's Medications  New Prescriptions   No medications on file  Previous Medications   BACLOFEN (LIORESAL) 10 MG TABLET    Take 10 mg by mouth 2 (two) times daily. For tremors   CALCIUM CARBONATE (TUMS - DOSED IN MG ELEMENTAL CALCIUM) 500 MG CHEWABLE TABLET    Chew 1 tablet by mouth daily as needed for heartburn (Pt request).   DIVALPROEX (DEPAKOTE) 500 MG DR TABLET    Take 500 mg by mouth 2 (two) times daily. For seizure disorder   DULOXETINE (CYMBALTA) 60 MG CAPSULE    Take 60 mg by mouth daily.   FUROSEMIDE (LASIX) 20 MG TABLET    Take 20 mg by mouth daily. Takes in the evening   LACTOSE FREE NUTRITION (BOOST) LIQD    Take 237 mLs by mouth daily.   LEVOTHYROXINE (SYNTHROID, LEVOTHROID) 150 MCG TABLET    Take 150 mcg by mouth daily before breakfast. For hypothyroidism   LISINOPRIL (PRINIVIL,ZESTRIL) 2.5 MG TABLET    Take 2.5 mg by mouth daily. For hypertension   METOPROLOL TARTRATE (LOPRESSOR) 25 MG TABLET    Take 25 mg by mouth daily.  MULTIPLE VITAMINS-MINERALS (CERTAGEN PO)    Take by mouth daily.   PRIMIDONE (MYSOLINE) 50 MG TABLET    Take 1.5 tablets (75 mg total) by mouth at bedtime.   TRAMADOL-ACETAMINOPHEN (ULTRACET) 37.5-325 MG PER TABLET    Take two tablets by mouth every 6 hours as needed for pain  Modified Medications   No medications on file  Discontinued Medications   No medications on file       Physical Exam: Physical Exam  Nursing note and vitals reviewed. Constitutional: No distress.  HENT:  Head: Normocephalic and atraumatic.  Right Ear: External ear normal.  Left Ear: External ear normal.  Nose: Nose normal.  Mouth/Throat: Oropharynx is clear and  moist. No oropharyngeal exudate.  Eyes: Conjunctivae and EOM are normal. Pupils are equal, round, and reactive to light.  Neck: Normal range of motion. Neck supple.  Cardiovascular: Normal rate and regular rhythm.   Pulmonary/Chest: Effort normal and breath sounds normal. No respiratory distress. She has no wheezes.  Abdominal: Soft. Bowel sounds are normal. She exhibits no distension. There is no tenderness.  Musculoskeletal: She exhibits edema (unchanged, bilateral LE).  Neurological: She is alert. She displays tremor.  Skin: Skin is warm. She is not diaphoretic. No erythema.  Psychiatric: Memory and affect normal.     Filed Vitals:   09/20/14 1507  BP: 104/52  Pulse: 65  Temp: 97.9 F (36.6 C)  Resp: 16      Labs reviewed: Basic Metabolic Panel:  Recent Labs  09/26/13 0350 09/27/13 0340 09/28/13 0512 07/23/14 1310  NA 135 138 138 133*  K 3.7 3.3* 4.1 5.9*  CL 101 106 104  --   CO2 $Re'25 24 22  'daE$ --   GLUCOSE 147* 90 105*  --   BUN $Re'15 13 14 'gwu$ 31*  CREATININE 0.51 0.44* 0.42* 0.6  CALCIUM 8.9 8.5 8.6  --    Liver Function Tests:  Recent Labs  09/25/13 0310 09/27/13 0340 09/28/13 0512 07/23/14 1310  AST 22 39* 36 20  ALT $Re'5 11 12 13  'iPe$ ALKPHOS 131* 217* 235* 110  BILITOT 0.2* 0.1* <0.1*  --   PROT 5.8* 5.9* 5.7*  --   ALBUMIN 1.7* 1.6* 1.5*  --    No results found for this basename: LIPASE, AMYLASE,  in the last 8760 hours No results found for this basename: AMMONIA,  in the last 8760 hours CBC:  Recent Labs  09/24/13 1040  09/26/13 0350 09/27/13 0340 09/28/13 0512 07/06/14 0230  WBC 24.6*  < > 23.4* 19.0* 12.1* 8.1  NEUTROABS 22.4*  --   --   --   --   --   HGB 12.9  < > 11.5* 10.7* 10.6* 11.3*  HCT 39.3  < > 36.1 33.6* 33.7* 36  MCV 91.2  < > 92.3 91.3 91.3  --   PLT 438*  < > 354 371 364 328  < > = values in this interval not displayed. Cardiac Enzymes:  Recent Labs  09/24/13 1040  CKTOTAL 31   BNP: No components found with this basename:  POCBNP,  CBG:  Recent Labs  09/28/13 2110 09/29/13 0752 09/29/13 0838  GLUCAP 97 48* 80   TSH:  Recent Labs  09/24/13 1040 07/06/14 0230  TSH 0.188* 3.71   A1C: Lab Results  Component Value Date   HGBA1C 5.6 07/06/2014   CBC NO Diff (Complete Blood Count)  Result: 03/31/2014 12:25 PM ( Status: F ) C  WBC 8.3 4.0-10.5 K/uL SLN  RBC  3.90 3.87-5.11 MIL/uL SLN  Hemoglobin 11.0 L 12.0-15.0 g/dL SLN  Hematocrit 33.7 L 36.0-46.0 % SLN  MCV 86.4 78.0-100.0 fL SLN  MCH 28.2 26.0-34.0 pg SLN  MCHC 32.6 30.0-36.0 g/dL SLN  RDW 17.7 H 11.5-15.5 % SLN  Platelet Count 338 150-400 K/uL SLN  TSH, Ultrasensitive  Result: 03/31/2014 1:06 PM ( Status: F )  TSH 1.565 0.350-4.500 uIU/mL SLN  Hemoglobin A1C  Result: 03/31/2014 2:55 PM ( Status: F )  Hemoglobin A1C 5.8 H <5.7 % SLN C  Estimated Average Glucose 120 H <117 mg/dL  CBC with Diff    Result: 07/05/2014 11:10 PM   ( Status: F )     C WBC 8.1     4.0-10.5 K/uL SLN   RBC 3.97     3.87-5.11 MIL/uL SLN   Hemoglobin 11.3   L 12.0-15.0 g/dL SLN   Hematocrit 35.5   L 36.0-46.0 % SLN   MCV 89.4     78.0-100.0 fL SLN   MCH 28.5     26.0-34.0 pg SLN   MCHC 31.8     30.0-36.0 g/dL SLN   RDW 16.5   H 11.5-15.5 % SLN   Platelet Count 382     150-400 K/uL SLN   Granulocyte % 58     43-77 % SLN   Absolute Gran 4.7     1.7-7.7 K/uL SLN   Lymph % 35     12-46 % SLN   Absolute Lymph 2.8     0.7-4.0 K/uL SLN   Mono % 6     3-12 % SLN   Absolute Mono 0.5     0.1-1.0 K/uL SLN   Eos % 1     0-5 % SLN   Absolute Eos 0.1     0.0-0.7 K/uL SLN   Baso % 0     0-1 % SLN   Absolute Baso 0.0     0.0-0.1 K/uL SLN   Smear Review Criteria for review not met  SLN   Comprehensive Metabolic Panel    Result: 07/06/2014 1:35 AM   ( Status: F )       Sodium 133   L 135-145 mEq/L SLN   Potassium 6.1   H 3.5-5.3 mEq/L SLN C Chloride 96     96-112 mEq/L SLN   CO2 24     19-32 mEq/L SLN   Glucose 64   L 70-99 mg/dL SLN C BUN 21     6-23 mg/dL SLN     Creatinine 0.56     0.50-1.10 mg/dL SLN   Bilirubin, Total 0.2     0.2-1.2 mg/dL SLN   Alkaline Phosphatase 132   H 39-117 U/L SLN   AST/SGOT 15     0-37 U/L SLN   ALT/SGPT <8     0-35 U/L SLN   Total Protein 6.5     6.0-8.3 g/dL SLN   Albumin 3.1   L 3.5-5.2 g/dL SLN   Calcium 8.3   L 8.4-10.5 mg/dL SLN   TSH, Ultrasensitive    Result: 07/06/2014 2:11 AM   ( Status: F )       TSH 3.711     0.350-4.500 uIU/mL SLN   Hemoglobin A1C    Result: 07/06/2014 1:32 AM   ( Status: F )       Hemoglobin A1C 5.6     <5.7 % SLN C Estimated Average Glucose 114     <117 mg/dL SLN   Valproic Acid (Depakene)  Result: 07/06/2014 6:48 AM   ( Status: F )       Valproic Acid (Depakene) 73.2     50.0-100.0 ug/mL SLN  Basic Metabolic Panel    Result: 07/28/2014 10:43 AM   ( Status: F )     C Sodium 135     135-145 mEq/L SLN   Potassium 4.8     3.5-5.3 mEq/L SLN   Chloride 96     96-112 mEq/L SLN   CO2 26     19-32 mEq/L SLN   Glucose 69   L 70-99 mg/dL SLN   BUN 28   H 6-23 mg/dL SLN   Creatinine 0.66     0.50-1.10 mg/dL SLN   Calcium 8.7     8.4-10.5 mg/dL SLN  Assessment/Plan 1. Chronic diastolic CHF (congestive heart failure) -ongoing refusal of lasix but reports she is taking it now , pt does not appear to be in fluid overload at this time also does not allow for weights, discussed this as well. Will cont to monitor  2. Essential hypertension Patients blood pressure is stable; continue current regimen. Will monitor and make changes as necessary. 3. Tremor, essential -controlled  4. Chronic pain -noted improvement on cymbalta

## 2014-10-18 DIAGNOSIS — R1311 Dysphagia, oral phase: Secondary | ICD-10-CM | POA: Diagnosis not present

## 2014-10-18 DIAGNOSIS — R293 Abnormal posture: Secondary | ICD-10-CM | POA: Diagnosis not present

## 2014-10-18 DIAGNOSIS — R633 Feeding difficulties: Secondary | ICD-10-CM | POA: Diagnosis not present

## 2014-11-01 ENCOUNTER — Non-Acute Institutional Stay (SKILLED_NURSING_FACILITY): Payer: Medicare Other | Admitting: Nurse Practitioner

## 2014-11-01 DIAGNOSIS — I5032 Chronic diastolic (congestive) heart failure: Secondary | ICD-10-CM | POA: Diagnosis not present

## 2014-11-01 DIAGNOSIS — E039 Hypothyroidism, unspecified: Secondary | ICD-10-CM

## 2014-11-01 DIAGNOSIS — G40909 Epilepsy, unspecified, not intractable, without status epilepticus: Secondary | ICD-10-CM

## 2014-11-01 DIAGNOSIS — I878 Other specified disorders of veins: Secondary | ICD-10-CM

## 2014-11-01 DIAGNOSIS — E1165 Type 2 diabetes mellitus with hyperglycemia: Secondary | ICD-10-CM | POA: Diagnosis not present

## 2014-11-01 DIAGNOSIS — G8929 Other chronic pain: Secondary | ICD-10-CM | POA: Diagnosis not present

## 2014-11-01 DIAGNOSIS — G250 Essential tremor: Secondary | ICD-10-CM

## 2014-11-01 DIAGNOSIS — I10 Essential (primary) hypertension: Secondary | ICD-10-CM | POA: Diagnosis not present

## 2014-11-01 DIAGNOSIS — IMO0002 Reserved for concepts with insufficient information to code with codable children: Secondary | ICD-10-CM

## 2014-11-01 NOTE — Progress Notes (Signed)
Patient ID: [name], female   DOB: 24-Jan-1951, 63 y.o.   MRN: [id number]    Nursing Home Location:  [place] of Service: SNF (70)  PCP: Cyndee Brightly, MD  Allergies  Allergen Reactions  . Codeine   . Morphine And Related   . Naproxen   . Penicillins     Chief Complaint  Patient presents with  . Medical Management of Chronic Issues    HPI: 63 year old femalewith pmh of CHF, HTN, DM, Hypothyroidism, chronic venus ulcers followed by wound care. Patient seen today for follow up on chronic conditions. Pt without any acute concerns but reports she is not taking cymbalta (reports it is an antidepressant and she does not need that) also did not find much benefit with pain relief. Reports her pain is not bad at this time. Reports being in a good mood. Plans to get up later today. Staff still reports pt is non-complaint with personal care and does not take medications as prescribed.     Review of Systems:  Review of Systems  Constitutional: Negative for fever, chills, weight loss and malaise/fatigue.  HENT: Negative for sore throat.   Respiratory: Negative for cough, shortness of breath and wheezing.   Cardiovascular: Positive for leg swelling (stable). Negative for chest pain and palpitations.  Gastrointestinal: Negative for abdominal pain, diarrhea and constipation.  Genitourinary: Negative for dysuria, urgency and frequency.  Musculoskeletal: Positive for joint pain (improved). Negative for myalgias and back pain.  Skin: Negative for itching and rash.  Neurological: Negative for dizziness, sensory change, seizures, weakness and headaches.  Psychiatric/Behavioral: Negative for depression. The patient is not nervous/anxious and does not have insomnia.      Past Medical History  Diagnosis Date  . Hypertension   . Thyroid disease     Hypothyroidism  . CHF (congestive heart failure)     Status post 2-D echocardiogram 08/12/2010, EF 60-65%, grade  1 diastolic dysfunction  . Epilepsy   . Venous insufficiency   . Morbid obesity   . Venous stasis ulcers   . Essential tremor    Past Surgical History  Procedure Laterality Date  . Bilateral cataract surgery with lens implants    . Right total hip replacement    . Left hip hemiarthroplasty    . Right knee open reduction and internal fixation of patella fracture    . Abdominal hysterectomy     Social History:   reports that she has never smoked. She has never used smokeless tobacco. She reports that she does not drink alcohol or use illicit drugs.  No family history on file.  Medications: Patient's Medications  New Prescriptions   No medications on file  Previous Medications   BACLOFEN (LIORESAL) 10 MG TABLET    Take 10 mg by mouth 2 (two) times daily. For tremors   CALCIUM CARBONATE (TUMS - DOSED IN MG ELEMENTAL CALCIUM) 500 MG CHEWABLE TABLET    Chew 1 tablet by mouth daily as needed for heartburn (Pt request).   DIVALPROEX (DEPAKOTE) 500 MG DR TABLET    Take 500 mg by mouth 2 (two) times daily. For seizure disorder   FUROSEMIDE (LASIX) 20 MG TABLET    Take 20 mg by mouth daily. Takes in the evening   LACTOSE FREE NUTRITION (BOOST) LIQD    Take 237 mLs by mouth daily.   LEVOTHYROXINE (SYNTHROID, LEVOTHROID) 150 MCG TABLET    Take 150 mcg by mouth daily before breakfast. For hypothyroidism  LISINOPRIL (PRINIVIL,ZESTRIL) 2.5 MG TABLET    Take 2.5 mg by mouth daily. For hypertension   METOPROLOL TARTRATE (LOPRESSOR) 25 MG TABLET    Take 25 mg by mouth daily.   MULTIPLE VITAMINS-MINERALS (CERTAGEN PO)    Take by mouth daily.   PRIMIDONE (MYSOLINE) 50 MG TABLET    Take 1.5 tablets (75 mg total) by mouth at bedtime.   TRAMADOL-ACETAMINOPHEN (ULTRACET) 37.5-325 MG PER TABLET    Take two tablets by mouth every 6 hours as needed for pain  Modified Medications   No medications on file  Discontinued Medications   DULOXETINE (CYMBALTA) 60 MG CAPSULE    Take 60 mg by mouth daily.        Physical Exam: Physical Exam  Constitutional: No distress.  HENT:  Head: Normocephalic and atraumatic.  Right Ear: External ear normal.  Left Ear: External ear normal.  Nose: Nose normal.  Mouth/Throat: Oropharynx is clear and moist. No oropharyngeal exudate.  Eyes: Conjunctivae and EOM are normal. Pupils are equal, round, and reactive to light.  Neck: Normal range of motion. Neck supple.  Cardiovascular: Normal rate and regular rhythm.   Pulmonary/Chest: Effort normal and breath sounds normal. No respiratory distress. She has no wheezes.  Abdominal: Soft. Bowel sounds are normal. She exhibits no distension. There is no tenderness.  Musculoskeletal: She exhibits edema (unchanged, bilateral LE).  Neurological: She is alert. She displays tremor.  Skin: Skin is warm. She is not diaphoretic. No erythema.  Skin changes to bilateral LE, skin tear to right LE Using Vaseline gauge and compression wraps per wound care.   Psychiatric: Memory and affect normal.  Nursing note and vitals reviewed.    Filed Vitals:   11/01/14 1409  BP: 140/78  Pulse: 84  Temp: 98.6 F (37 C)  Resp: 20  SpO2: 96%      Labs reviewed: Basic Metabolic Panel:  Recent Labs  07/23/14 1310  NA 133*  K 5.9*  BUN 31*  CREATININE 0.6   Liver Function Tests:  Recent Labs  07/23/14 1310  AST 20  ALT 13  ALKPHOS 110   No results for input(s): LIPASE, AMYLASE in the last 8760 hours. No results for input(s): AMMONIA in the last 8760 hours. CBC:  Recent Labs  07/06/14 0230  WBC 8.1  HGB 11.3*  HCT 36  PLT 328   Cardiac Enzymes: No results for input(s): CKTOTAL, CKMB, CKMBINDEX, TROPONINI in the last 8760 hours. BNP: Invalid input(s): POCBNP CBG: No results for input(s): GLUCAP in the last 8760 hours. TSH:  Recent Labs  07/06/14 0230  TSH 3.71   A1C: Lab Results  Component Value Date   HGBA1C 5.6 07/06/2014   CBC NO Diff (Complete Blood Count)  Result: 03/31/2014 12:25  PM ( Status: F ) C  WBC 8.3 4.0-10.5 K/uL SLN  RBC 3.90 3.87-5.11 MIL/uL SLN  Hemoglobin 11.0 L 12.0-15.0 g/dL SLN  Hematocrit 33.7 L 36.0-46.0 % SLN  MCV 86.4 78.0-100.0 fL SLN  MCH 28.2 26.0-34.0 pg SLN  MCHC 32.6 30.0-36.0 g/dL SLN  RDW 17.7 H 11.5-15.5 % SLN  Platelet Count 338 150-400 K/uL SLN  TSH, Ultrasensitive  Result: 03/31/2014 1:06 PM ( Status: F )  TSH 1.565 0.350-4.500 uIU/mL SLN  Hemoglobin A1C  Result: 03/31/2014 2:55 PM ( Status: F )  Hemoglobin A1C 5.8 H <5.7 % SLN C  Estimated Average Glucose 120 H <117 mg/dL  CBC with Diff    Result: 07/05/2014 11:10 PM   ( Status: F )  C WBC 8.1     4.0-10.5 K/uL SLN   RBC 3.97     3.87-5.11 MIL/uL SLN   Hemoglobin 11.3   L 12.0-15.0 g/dL SLN   Hematocrit 35.5   L 36.0-46.0 % SLN   MCV 89.4     78.0-100.0 fL SLN   MCH 28.5     26.0-34.0 pg SLN   MCHC 31.8     30.0-36.0 g/dL SLN   RDW 16.5   H 11.5-15.5 % SLN   Platelet Count 382     150-400 K/uL SLN   Granulocyte % 58     43-77 % SLN   Absolute Gran 4.7     1.7-7.7 K/uL SLN   Lymph % 35     12-46 % SLN   Absolute Lymph 2.8     0.7-4.0 K/uL SLN   Mono % 6     3-12 % SLN   Absolute Mono 0.5     0.1-1.0 K/uL SLN   Eos % 1     0-5 % SLN   Absolute Eos 0.1     0.0-0.7 K/uL SLN   Baso % 0     0-1 % SLN   Absolute Baso 0.0     0.0-0.1 K/uL SLN   Smear Review Criteria for review not met  SLN   Comprehensive Metabolic Panel    Result: 07/06/2014 1:35 AM   ( Status: F )       Sodium 133   L 135-145 mEq/L SLN   Potassium 6.1   H 3.5-5.3 mEq/L SLN C Chloride 96     96-112 mEq/L SLN   CO2 24     19-32 mEq/L SLN   Glucose 64   L 70-99 mg/dL SLN C BUN 21     6-23 mg/dL SLN   Creatinine 0.56     0.50-1.10 mg/dL SLN   Bilirubin, Total 0.2     0.2-1.2 mg/dL SLN   Alkaline Phosphatase 132   H 39-117 U/L SLN   AST/SGOT 15     0-37 U/L SLN   ALT/SGPT <8     0-35 U/L SLN   Total Protein 6.5     6.0-8.3 g/dL SLN   Albumin 3.1   L 3.5-5.2 g/dL SLN   Calcium 8.3    L 8.4-10.5 mg/dL SLN   TSH, Ultrasensitive    Result: 07/06/2014 2:11 AM   ( Status: F )       TSH 3.711     0.350-4.500 uIU/mL SLN   Hemoglobin A1C    Result: 07/06/2014 1:32 AM   ( Status: F )       Hemoglobin A1C 5.6     <5.7 % SLN C Estimated Average Glucose 114     <117 mg/dL SLN   Valproic Acid (Depakene)    Result: 07/06/2014 6:48 AM   ( Status: F )       Valproic Acid (Depakene) 73.2     50.0-100.0 ug/mL SLN  Basic Metabolic Panel    Result: 07/28/2014 10:43 AM   ( Status: F )     C Sodium 135     135-145 mEq/L SLN   Potassium 4.8     3.5-5.3 mEq/L SLN   Chloride 96     96-112 mEq/L SLN   CO2 26     19-32 mEq/L SLN   Glucose 69   L 70-99 mg/dL SLN   BUN 28   H 6-23 mg/dL SLN   Creatinine 0.66  0.50-1.10 mg/dL SLN   Calcium 8.7     8.4-10.5 mg/dL SLN  Assessment/Plan 1. Tremor, essential Stable on mysoline   2. Seizure disorder No noted seizures, conts on depakote  3. Hypothyroidism, unspecified hypothyroidism type Will get TSH  4. Chronic diastolic CHF (congestive heart failure) No recent weights, edema is stable, no worsening shortness of breath, will cont lasix and follow up cmp  5. Essential hypertension -Patients hypertension  is stable; continue current regimen. Will monitor and make changes as necessary.  6. Lower extremity venous stasis With chronic ulcers, wound care following Remains stable  7. DM (diabetes mellitus), type 2, uncontrolled Has been managed with dietary modifications, will get A1c  8. Chronic pain States she was not getting much benefit from Cymbalta therefore declines to take this, also does not wish to be on antidepressant; will dc from [facility]

## 2014-11-16 DIAGNOSIS — R1311 Dysphagia, oral phase: Secondary | ICD-10-CM | POA: Diagnosis not present

## 2014-11-16 DIAGNOSIS — R633 Feeding difficulties: Secondary | ICD-10-CM | POA: Diagnosis not present

## 2014-11-16 DIAGNOSIS — R293 Abnormal posture: Secondary | ICD-10-CM | POA: Diagnosis not present

## 2014-11-23 DIAGNOSIS — F419 Anxiety disorder, unspecified: Secondary | ICD-10-CM | POA: Diagnosis not present

## 2014-11-23 DIAGNOSIS — F329 Major depressive disorder, single episode, unspecified: Secondary | ICD-10-CM | POA: Diagnosis not present

## 2014-11-23 DIAGNOSIS — F39 Unspecified mood [affective] disorder: Secondary | ICD-10-CM | POA: Diagnosis not present

## 2014-12-06 ENCOUNTER — Non-Acute Institutional Stay (SKILLED_NURSING_FACILITY): Payer: Medicare Other | Admitting: Nurse Practitioner

## 2014-12-06 DIAGNOSIS — E1165 Type 2 diabetes mellitus with hyperglycemia: Secondary | ICD-10-CM

## 2014-12-06 DIAGNOSIS — G250 Essential tremor: Secondary | ICD-10-CM | POA: Diagnosis not present

## 2014-12-06 DIAGNOSIS — I878 Other specified disorders of veins: Secondary | ICD-10-CM

## 2014-12-06 DIAGNOSIS — I5032 Chronic diastolic (congestive) heart failure: Secondary | ICD-10-CM

## 2014-12-06 DIAGNOSIS — G40909 Epilepsy, unspecified, not intractable, without status epilepticus: Secondary | ICD-10-CM | POA: Diagnosis not present

## 2014-12-06 DIAGNOSIS — E039 Hypothyroidism, unspecified: Secondary | ICD-10-CM

## 2014-12-06 DIAGNOSIS — IMO0002 Reserved for concepts with insufficient information to code with codable children: Secondary | ICD-10-CM

## 2014-12-06 DIAGNOSIS — I10 Essential (primary) hypertension: Secondary | ICD-10-CM

## 2014-12-06 NOTE — Progress Notes (Signed)
Patient ID: [name], female   DOB: 08/21/1951, 63 y.o.   MRN: [id number]    Nursing Home Location:  [place] [facility] ([id number])  PCP: Cyndee Brightly, MD  Allergies  Allergen Reactions  . Codeine   . Morphine And Related   . Naproxen   . Penicillins     Chief Complaint  Patient presents with  . Medical Management of Chronic Issues    HPI: 63 year old femalewith pmh of CHF, HTN, DM, Hypothyroidism, chronic venus ulcers followed by wound care. Patient seen today for follow up on chronic conditions. Pt without any acute issues, reports she is fine. Has not let staff draw her blood. Reports she feels fine and does not want [place] to draw it. Would like to follow up with neurologist regarding seizures and tremor. Ongoing moniting and dressing changes by wound care nurse for lower extremity ulcers.   Review of Systems:  Review of Systems  Constitutional: Negative for fever, chills, weight loss and malaise/fatigue.  HENT: Negative for sore throat.   Respiratory: Negative for cough, shortness of breath and wheezing.   Cardiovascular: Positive for leg swelling (unchanged). Negative for chest pain and palpitations.  Gastrointestinal: Negative for abdominal pain, diarrhea and constipation.  Genitourinary: Negative for dysuria, urgency and frequency.  Musculoskeletal: Negative for myalgias, back pain and joint pain.  Skin: Negative for itching and rash.  Neurological: Positive for tremors. Negative for dizziness, sensory change, seizures, weakness and headaches.  Psychiatric/Behavioral: Negative for depression. The patient is not nervous/anxious and does not have insomnia.      Past Medical History  Diagnosis Date  . Hypertension   . Thyroid disease     Hypothyroidism  . CHF (congestive heart failure)     Status post 2-D echocardiogram 08/12/2010, EF 23-30%, grade 1 diastolic dysfunction  . Epilepsy   . Venous insufficiency   . Morbid obesity   .  Venous stasis ulcers   . Essential tremor    Past Surgical History  Procedure Laterality Date  . Bilateral cataract surgery with lens implants    . Right total hip replacement    . Left hip hemiarthroplasty    . Right knee open reduction and internal fixation of patella fracture    . Abdominal hysterectomy     Social History:   reports that she has never smoked. She has never used smokeless tobacco. She reports that she does not drink alcohol or use illicit drugs.  No family history on file.  Medications: Patient's Medications  New Prescriptions   No medications on file  Previous Medications   BACLOFEN (LIORESAL) 10 MG TABLET    Take 10 mg by mouth 2 (two) times daily. For tremors   CALCIUM CARBONATE (TUMS - DOSED IN MG ELEMENTAL CALCIUM) 500 MG CHEWABLE TABLET    Chew 1 tablet by mouth daily as needed for heartburn (Pt request).   DIVALPROEX (DEPAKOTE) 500 MG DR TABLET    Take 500 mg by mouth 2 (two) times daily. For seizure disorder   FUROSEMIDE (LASIX) 20 MG TABLET    Take 20 mg by mouth daily. Takes in the evening   LACTOSE FREE NUTRITION (BOOST) LIQD    Take 237 mLs by mouth daily.   LEVOTHYROXINE (SYNTHROID, LEVOTHROID) 150 MCG TABLET    Take 150 mcg by mouth daily before breakfast. For hypothyroidism   LISINOPRIL (PRINIVIL,ZESTRIL) 2.5 MG TABLET    Take 2.5 mg by mouth daily. For hypertension  METOPROLOL TARTRATE (LOPRESSOR) 25 MG TABLET    Take 25 mg by mouth daily.   MULTIPLE VITAMINS-MINERALS (CERTAGEN PO)    Take by mouth daily.   PRIMIDONE (MYSOLINE) 50 MG TABLET    Take 1.5 tablets (75 mg total) by mouth at bedtime.   TRAMADOL-ACETAMINOPHEN (ULTRACET) 37.5-325 MG PER TABLET    Take two tablets by mouth every 6 hours as needed for pain  Modified Medications   No medications on file  Discontinued Medications   No medications on file       Physical Exam: Physical Exam  Constitutional: No distress.  HENT:  Head: Normocephalic and atraumatic.  Right Ear:  External ear normal.  Left Ear: External ear normal.  Nose: Nose normal.  Mouth/Throat: Oropharynx is clear and moist. No oropharyngeal exudate.  Eyes: Conjunctivae and EOM are normal. Pupils are equal, round, and reactive to light.  Neck: Normal range of motion. Neck supple.  Cardiovascular: Normal rate and regular rhythm.   Pulmonary/Chest: Effort normal and breath sounds normal. No respiratory distress. She has no wheezes.  Abdominal: Soft. Bowel sounds are normal. She exhibits no distension. There is no tenderness.  Musculoskeletal: She exhibits edema (unchanged, bilateral LE).  Neurological: She is alert. She displays tremor.  Skin: Skin is warm. She is not diaphoretic. No erythema.  Skin changes to bilateral LE, skin tear to right LE Using Vaseline gauge and compression wraps per wound care.   Psychiatric: Memory and affect normal.  Nursing note and vitals reviewed.    Filed Vitals:   12/06/14 1133  Pulse: 80  Resp: 20      Labs reviewed: Basic Metabolic Panel:  Recent Labs  07/23/14 1310  NA 133*  K 5.9*  BUN 31*  CREATININE 0.6   Liver Function Tests:  Recent Labs  07/23/14 1310  AST 20  ALT 13  ALKPHOS 110   No results for input(s): LIPASE, AMYLASE in the last 8760 hours. No results for input(s): AMMONIA in the last 8760 hours. CBC:  Recent Labs  07/06/14 0230  WBC 8.1  HGB 11.3*  HCT 36  PLT 328   Cardiac Enzymes: No results for input(s): CKTOTAL, CKMB, CKMBINDEX, TROPONINI in the last 8760 hours. BNP: Invalid input(s): POCBNP CBG: No results for input(s): GLUCAP in the last 8760 hours. TSH:  Recent Labs  07/06/14 0230  TSH 3.71   A1C: Lab Results  Component Value Date   HGBA1C 5.6 07/06/2014   CBC NO Diff (Complete Blood Count)  Result: 03/31/2014 12:25 PM ( Status: F ) C  WBC 8.3 4.0-10.5 K/uL SLN  RBC 3.90 3.87-5.11 MIL/uL SLN  Hemoglobin 11.0 L 12.0-15.0 g/dL SLN  Hematocrit 33.7 L 36.0-46.0 % SLN  MCV 86.4 78.0-100.0 fL  SLN  MCH 28.2 26.0-34.0 pg SLN  MCHC 32.6 30.0-36.0 g/dL SLN  RDW 17.7 H 11.5-15.5 % SLN  Platelet Count 338 150-400 K/uL SLN  TSH, Ultrasensitive  Result: 03/31/2014 1:06 PM ( Status: F )  TSH 1.565 0.350-4.500 uIU/mL SLN  Hemoglobin A1C  Result: 03/31/2014 2:55 PM ( Status: F )  Hemoglobin A1C 5.8 H <5.7 % SLN C  Estimated Average Glucose 120 H <117 mg/dL  CBC with Diff    Result: 07/05/2014 11:10 PM   ( Status: F )     C WBC 8.1     4.0-10.5 K/uL SLN   RBC 3.97     3.87-5.11 MIL/uL SLN   Hemoglobin 11.3   L 12.0-15.0 g/dL SLN   Hematocrit 35.5  L 36.0-46.0 % SLN   MCV 89.4     78.0-100.0 fL SLN   MCH 28.5     26.0-34.0 pg SLN   MCHC 31.8     30.0-36.0 g/dL SLN   RDW 16.5   H 11.5-15.5 % SLN   Platelet Count 382     150-400 K/uL SLN   Granulocyte % 58     43-77 % SLN   Absolute Gran 4.7     1.7-7.7 K/uL SLN   Lymph % 35     12-46 % SLN   Absolute Lymph 2.8     0.7-4.0 K/uL SLN   Mono % 6     3-12 % SLN   Absolute Mono 0.5     0.1-1.0 K/uL SLN   Eos % 1     0-5 % SLN   Absolute Eos 0.1     0.0-0.7 K/uL SLN   Baso % 0     0-1 % SLN   Absolute Baso 0.0     0.0-0.1 K/uL SLN   Smear Review Criteria for review not met  SLN   Comprehensive Metabolic Panel    Result: 07/06/2014 1:35 AM   ( Status: F )       Sodium 133   L 135-145 mEq/L SLN   Potassium 6.1   H 3.5-5.3 mEq/L SLN C Chloride 96     96-112 mEq/L SLN   CO2 24     19-32 mEq/L SLN   Glucose 64   L 70-99 mg/dL SLN C BUN 21     6-23 mg/dL SLN   Creatinine 0.56     0.50-1.10 mg/dL SLN   Bilirubin, Total 0.2     0.2-1.2 mg/dL SLN   Alkaline Phosphatase 132   H 39-117 U/L SLN   AST/SGOT 15     0-37 U/L SLN   ALT/SGPT <8     0-35 U/L SLN   Total Protein 6.5     6.0-8.3 g/dL SLN   Albumin 3.1   L 3.5-5.2 g/dL SLN   Calcium 8.3   L 8.4-10.5 mg/dL SLN   TSH, Ultrasensitive    Result: 07/06/2014 2:11 AM   ( Status: F )       TSH 3.711     0.350-4.500 uIU/mL SLN   Hemoglobin A1C    Result: 07/06/2014 1:32 AM   ( Status: F )        Hemoglobin A1C 5.6     <5.7 % SLN C Estimated Average Glucose 114     <117 mg/dL SLN   Valproic Acid (Depakene)    Result: 07/06/2014 6:48 AM   ( Status: F )       Valproic Acid (Depakene) 73.2     50.0-100.0 ug/mL SLN  Basic Metabolic Panel    Result: 07/28/2014 10:43 AM   ( Status: F )     C Sodium 135     135-145 mEq/L SLN   Potassium 4.8     3.5-5.3 mEq/L SLN   Chloride 96     96-112 mEq/L SLN   CO2 26     19-32 mEq/L SLN   Glucose 69   L 70-99 mg/dL SLN   BUN 28   H 6-23 mg/dL SLN   Creatinine 0.66     0.50-1.10 mg/dL SLN   Calcium 8.7     8.4-10.5 mg/dL SLN  Assessment/Plan 1. Seizure disorder Without seizures, conts on depakote, will have staff make appt for neurologist  2. Tremor,  essential Does not feel as this is well controlled and would like to see neurologist, will have staff make appt  3. Hypothyroidism, unspecified hypothyroidism type -conts on synthroid 150 mcg, did not let staff get TSH, educated on need to follow lab work, will reorder  4. Essential hypertension Does not let staff get VS, conts on lisinopril   5. Chronic diastolic CHF (congestive heart failure) Unchanged, will not allow for weights despite ongoing education, also did not allow for BMP, reeducated on improved, will reorder  6. DM (diabetes mellitus), type 2, uncontrolled Will reorder a1C, also did not allow lab to be drawn  7. Lower extremity venous stasis With ulcers followed by treatment nurse, ulcers remain stable

## 2014-12-15 ENCOUNTER — Encounter: Payer: Self-pay | Admitting: Diagnostic Neuroimaging

## 2014-12-15 ENCOUNTER — Ambulatory Visit (INDEPENDENT_AMBULATORY_CARE_PROVIDER_SITE_OTHER): Payer: Medicare Other | Admitting: Diagnostic Neuroimaging

## 2014-12-15 VITALS — Ht 64.0 in | Wt 158.0 lb

## 2014-12-15 DIAGNOSIS — R259 Unspecified abnormal involuntary movements: Secondary | ICD-10-CM | POA: Diagnosis not present

## 2014-12-15 DIAGNOSIS — G252 Other specified forms of tremor: Secondary | ICD-10-CM | POA: Diagnosis not present

## 2014-12-15 DIAGNOSIS — G20 Parkinson's disease: Secondary | ICD-10-CM

## 2014-12-15 MED ORDER — PRIMIDONE 50 MG PO TABS: 50.0000 mg | ORAL_TABLET | Freq: Two times a day (BID) | ORAL | Status: AC

## 2014-12-15 NOTE — Progress Notes (Signed)
[facility]  PATIENT: Kristen OkaGloria L [name] DOB: [birth year]  REFERRING CLINICIAN: Leanord Hawkingobson HISTORY FROM: patient  REASON FOR VISIT: new consult    HISTORICAL  CHIEF COMPLAINT:  Chief Complaint  Patient presents with  . Seizures  . Tremors    HISTORY OF PRESENT ILLNESS:   63 year old right-handed female here for evaluation of seizure disorder and tremors.  Patient had seizures since fourth grade. Patient had intermittent staring spells, convulsions, loss of consciousness, tongue biting. Patient initially treated with phenobarbital and Dilantin. In the 1980s patient was transitioned to Depakote. Around the 1990s patient developed intermittent postural and action tremor. This was initially thought to be related to Depakote and patient had change in her seizure medication. However seizures were not well-controlled on other medications. Patient went back to Depakote.  Patient's last seizure was approximately 8 years ago.  Over the last 1-2 years patient has developed a new type of tremor, consisting of rest tremor. Patient has been tried on primidone recently without benefit of tremor.  Of note patient has had progressive balance and gait decline. She had several falls, leading to hip problems and hip fracture. Currently patient is wheelchair bound. Patient also had developed significant peripheral edema, limiting her mobility.   REVIEW OF SYSTEMS: Full 14 system review of systems performed and notable only for swelling in legs aching muscles.   ALLERGIES: Allergies  Allergen Reactions  . Codeine   . Morphine And Related   . Naproxen   . Penicillins     HOME MEDICATIONS: Outpatient Prescriptions Prior to Visit  Medication Sig Dispense Refill  . baclofen (LIORESAL) 10 MG tablet Take 10 mg by mouth 2 (two) times daily. For tremors    . calcium carbonate (TUMS - DOSED IN MG ELEMENTAL CALCIUM) 500 MG chewable tablet Chew 1 tablet by mouth daily as needed for heartburn  (Pt request).    Marland Kitchen. divalproex (DEPAKOTE) 500 MG DR tablet Take 500 mg by mouth 2 (two) times daily. For seizure disorder    . furosemide (LASIX) 20 MG tablet Take 20 mg by mouth daily. Takes in the evening    . levothyroxine (SYNTHROID, LEVOTHROID) 150 MCG tablet Take 150 mcg by mouth daily before breakfast. For hypothyroidism    . metoprolol tartrate (LOPRESSOR) 25 MG tablet Take 25 mg by mouth daily.    . Multiple Vitamins-Minerals (CERTAGEN PO) Take by mouth daily.    . traMADol-acetaminophen (ULTRACET) 37.5-325 MG per tablet Take two tablets by mouth every 6 hours as needed for pain 240 tablet 5  . primidone (MYSOLINE) 50 MG tablet Take 1.5 tablets (75 mg total) by mouth at bedtime.    . lactose free nutrition (BOOST) LIQD Take 237 mLs by mouth daily.    Marland Kitchen. lisinopril (PRINIVIL,ZESTRIL) 2.5 MG tablet Take 2.5 mg by mouth daily. For hypertension     No facility-administered medications prior to visit.    PAST MEDICAL HISTORY: Past Medical History  Diagnosis Date  . Hypertension   . Thyroid disease     Hypothyroidism  . CHF (congestive heart failure)     Status post 2-D echocardiogram 08/12/2010, EF 60-65%, grade 1 diastolic dysfunction  . Epilepsy   . Venous insufficiency   . Morbid obesity   . Venous stasis ulcers   . Essential tremor     PAST SURGICAL HISTORY: Past Surgical History  Procedure Laterality Date  . Bilateral cataract surgery with lens implants    . Right total hip replacement    . Left hip  hemiarthroplasty    . Right knee open reduction and internal fixation of patella fracture    . Abdominal hysterectomy      FAMILY HISTORY: Family History  Problem Relation Age of Onset  . Other Mother     SOCIAL HISTORY:  History   Social History  . Marital Status: Single    Spouse Name: N/A    Number of Children: N/A  . Years of Education: N/A   Occupational History  . Not on file.   Social History Main Topics  . Smoking status: Never Smoker   . Smokeless  tobacco: Never Used  . Alcohol Use: No  . Drug Use: No  . Sexual Activity: Not on file   Other Topics Concern  . Not on file   Social History Narrative   Resident of [facility] facility. Nonambulatory. Wheelchair-bound. Divorced.     PHYSICAL EXAM  Filed Vitals:   12/15/14 0836  Height: 5\' 4"  (1.626 m)  Weight: 158 lb (71.668 kg)    Body mass index is 27.11 kg/(m^2).  No exam data present  No flowsheet data found.  GENERAL EXAM: Patient is in no distress; well developed, nourished and groomed; neck is supple; DISHEVELED, POOR DENTITION; SIGNIFICANT PERIPHERAL EDEMA IN BLE  CARDIOVASCULAR: Regular rate and rhythm, no murmurs, no carotid bruits  NEUROLOGIC: MENTAL STATUS: awake, alert, oriented to person, place and time, recent and remote memory intact, normal attention and concentration, language fluent, comprehension intact, naming intact, fund of knowledge appropriate CRANIAL NERVE: no papilledema on fundoscopic exam, pupils equal and reactive to light, visual fields full to confrontation, extraocular muscles intact, no nystagmus, facial sensation and strength symmetric, hearing intact, palate elevates symmetrically, uvula midline, shoulder shrug symmetric, tongue midline. MOTOR: normal bulk; CONTINUOUS RESTING TREMOR OF BUE (R > L). MIXED POSTURAL AND ACTION TREMOR IN BUE. COGWHEELING AND BRADYKINESIA IN BUE; STRENGTH TESTING --> BUE 4; BLE 1-2 SENSORY: DECR IN BLE; BUE NORMAL COORDINATION: finger-nose-finger, fine finger movements SLOW REFLEXES: BUE 1; BLE 0 GAIT/STATION: IN WHEELCHAIR; CANNOT STAND    DIAGNOSTIC DATA (LABS, IMAGING, TESTING) - I reviewed patient records, labs, notes, testing and imaging myself where available.  Lab Results  Component Value Date   WBC 8.1 07/06/2014   HGB 11.3* 07/06/2014   HCT 36 07/06/2014   MCV 91.3 09/28/2013   PLT 328 07/06/2014      Component Value Date/Time   NA 133* 07/23/2014 1310   NA 138  09/28/2013 0512   K 5.9* 07/23/2014 1310   CL 104 09/28/2013 0512   CO2 22 09/28/2013 0512   GLUCOSE 105* 09/28/2013 0512   BUN 31* 07/23/2014 1310   BUN 14 09/28/2013 0512   CREATININE 0.6 07/23/2014 1310   CREATININE 0.42* 09/28/2013 0512   CALCIUM 8.6 09/28/2013 0512   PROT 5.7* 09/28/2013 0512   ALBUMIN 1.5* 09/28/2013 0512   AST 20 07/23/2014 1310   ALT 13 07/23/2014 1310   ALKPHOS 110 07/23/2014 1310   BILITOT <0.1* 09/28/2013 0512   GFRNONAA >90 09/28/2013 0512   GFRAA >90 09/28/2013 0512   No results found for: CHOL, HDL, LDLCALC, LDLDIRECT, TRIG, CHOLHDL Lab Results  Component Value Date   HGBA1C 5.6 07/06/2014   No results found for: VITAMINB12 Lab Results  Component Value Date   TSH 3.71 07/06/2014      ASSESSMENT AND PLAN  63 y.o. 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 Also has  combination of postural/action tremor (possible essential tremor vs medication tremor from depakote), and now with new type of resting tremor, cogwheel rigidity and bradykinesia suggestive of parkinsonism.  Offered to increase primidone further to see if this helps tremor versus trial of carbidopa/levodopa. Patient would like to try higher primidone first. I would be reluctant to reduce Depakote as this has been the only seizure medication that has been effective for her.   PLAN: - increase primidone to 50mg  BID, and gradually up to 100-200mg  BID - may consider carbidopa/levodopa    Orders Placed This Encounter  Procedures  . MR Brain Wo Contrast   Meds ordered this encounter  Medications  . primidone (MYSOLINE) 50 MG tablet    Sig: Take 1 tablet (50 mg total) by mouth 2 (two) times daily.   Return in about 3 months (around 03/16/2015).    Suanne Marker, MD 12/15/2014, 9:40 AM Certified in Neurology, Neurophysiology and Neuroimaging  [facility] [address]  [address], [place] [address] [phone]

## 2014-12-15 NOTE — Patient Instructions (Signed)
Change primidone to 50mg  twice a day.  I will check MRI brain.  May consider carbidopa/levodopa in future.

## 2014-12-17 DIAGNOSIS — R633 Feeding difficulties: Secondary | ICD-10-CM | POA: Diagnosis not present

## 2014-12-17 DIAGNOSIS — R293 Abnormal posture: Secondary | ICD-10-CM | POA: Diagnosis not present

## 2014-12-17 DIAGNOSIS — R1311 Dysphagia, oral phase: Secondary | ICD-10-CM | POA: Diagnosis not present

## 2014-12-20 DIAGNOSIS — R293 Abnormal posture: Secondary | ICD-10-CM | POA: Diagnosis not present

## 2014-12-20 DIAGNOSIS — R633 Feeding difficulties: Secondary | ICD-10-CM | POA: Diagnosis not present

## 2014-12-20 DIAGNOSIS — R1311 Dysphagia, oral phase: Secondary | ICD-10-CM | POA: Diagnosis not present

## 2014-12-21 ENCOUNTER — Telehealth: Payer: Self-pay | Admitting: *Deleted

## 2014-12-21 DIAGNOSIS — R633 Feeding difficulties: Secondary | ICD-10-CM | POA: Diagnosis not present

## 2014-12-21 DIAGNOSIS — R1311 Dysphagia, oral phase: Secondary | ICD-10-CM | POA: Diagnosis not present

## 2014-12-21 DIAGNOSIS — R293 Abnormal posture: Secondary | ICD-10-CM | POA: Diagnosis not present

## 2014-12-21 NOTE — Telephone Encounter (Signed)
Returned patient's call. She had general questions about MRI. Patient verbalized understanding.

## 2014-12-21 NOTE — Telephone Encounter (Signed)
Patient wishes to speak with Dr Marjory LiesPenumalli in about an MRI that he ordered for patient, she would not go into detail, informed patient that Dr Marjory LiesPenumalli is with patients.

## 2014-12-22 DIAGNOSIS — R633 Feeding difficulties: Secondary | ICD-10-CM | POA: Diagnosis not present

## 2014-12-22 DIAGNOSIS — R293 Abnormal posture: Secondary | ICD-10-CM | POA: Diagnosis not present

## 2014-12-22 DIAGNOSIS — R1311 Dysphagia, oral phase: Secondary | ICD-10-CM | POA: Diagnosis not present

## 2014-12-23 DIAGNOSIS — R633 Feeding difficulties: Secondary | ICD-10-CM | POA: Diagnosis not present

## 2014-12-23 DIAGNOSIS — R1311 Dysphagia, oral phase: Secondary | ICD-10-CM | POA: Diagnosis not present

## 2014-12-23 DIAGNOSIS — R293 Abnormal posture: Secondary | ICD-10-CM | POA: Diagnosis not present

## 2014-12-24 DIAGNOSIS — R293 Abnormal posture: Secondary | ICD-10-CM | POA: Diagnosis not present

## 2014-12-24 DIAGNOSIS — R1311 Dysphagia, oral phase: Secondary | ICD-10-CM | POA: Diagnosis not present

## 2014-12-24 DIAGNOSIS — R633 Feeding difficulties: Secondary | ICD-10-CM | POA: Diagnosis not present

## 2014-12-27 DIAGNOSIS — R293 Abnormal posture: Secondary | ICD-10-CM | POA: Diagnosis not present

## 2014-12-27 DIAGNOSIS — R1311 Dysphagia, oral phase: Secondary | ICD-10-CM | POA: Diagnosis not present

## 2014-12-27 DIAGNOSIS — R633 Feeding difficulties: Secondary | ICD-10-CM | POA: Diagnosis not present

## 2014-12-28 DIAGNOSIS — R1311 Dysphagia, oral phase: Secondary | ICD-10-CM | POA: Diagnosis not present

## 2014-12-28 DIAGNOSIS — R293 Abnormal posture: Secondary | ICD-10-CM | POA: Diagnosis not present

## 2014-12-28 DIAGNOSIS — R633 Feeding difficulties: Secondary | ICD-10-CM | POA: Diagnosis not present

## 2014-12-29 DIAGNOSIS — R1311 Dysphagia, oral phase: Secondary | ICD-10-CM | POA: Diagnosis not present

## 2014-12-29 DIAGNOSIS — R293 Abnormal posture: Secondary | ICD-10-CM | POA: Diagnosis not present

## 2014-12-29 DIAGNOSIS — R633 Feeding difficulties: Secondary | ICD-10-CM | POA: Diagnosis not present

## 2014-12-30 ENCOUNTER — Inpatient Hospital Stay: Admission: RE | Admit: 2014-12-30 | Payer: Self-pay | Source: Ambulatory Visit

## 2014-12-30 DIAGNOSIS — R293 Abnormal posture: Secondary | ICD-10-CM | POA: Diagnosis not present

## 2014-12-30 DIAGNOSIS — R633 Feeding difficulties: Secondary | ICD-10-CM | POA: Diagnosis not present

## 2014-12-30 DIAGNOSIS — R1311 Dysphagia, oral phase: Secondary | ICD-10-CM | POA: Diagnosis not present

## 2014-12-31 DIAGNOSIS — R633 Feeding difficulties: Secondary | ICD-10-CM | POA: Diagnosis not present

## 2014-12-31 DIAGNOSIS — R293 Abnormal posture: Secondary | ICD-10-CM | POA: Diagnosis not present

## 2014-12-31 DIAGNOSIS — R1311 Dysphagia, oral phase: Secondary | ICD-10-CM | POA: Diagnosis not present

## 2015-01-03 DIAGNOSIS — R633 Feeding difficulties: Secondary | ICD-10-CM | POA: Diagnosis not present

## 2015-01-03 DIAGNOSIS — R1311 Dysphagia, oral phase: Secondary | ICD-10-CM | POA: Diagnosis not present

## 2015-01-03 DIAGNOSIS — R293 Abnormal posture: Secondary | ICD-10-CM | POA: Diagnosis not present

## 2015-01-04 ENCOUNTER — Telehealth: Payer: Self-pay | Admitting: *Deleted

## 2015-01-04 ENCOUNTER — Telehealth: Payer: Self-pay | Admitting: Diagnostic Neuroimaging

## 2015-01-04 DIAGNOSIS — R293 Abnormal posture: Secondary | ICD-10-CM | POA: Diagnosis not present

## 2015-01-04 DIAGNOSIS — R633 Feeding difficulties: Secondary | ICD-10-CM | POA: Diagnosis not present

## 2015-01-04 DIAGNOSIS — R1311 Dysphagia, oral phase: Secondary | ICD-10-CM | POA: Diagnosis not present

## 2015-01-04 NOTE — Telephone Encounter (Signed)
Patient would like to discuss MRI further before rescheduling MRI from 12/30/14.  Please call and advise.

## 2015-01-04 NOTE — Telephone Encounter (Signed)
Patient calling back to speak with Dr. Marjory LiesPenumalli. She has questions about MRI that she canceled. Patient requesting to know if she will be able to stay in her wheelchair to have MRI. Advised patient to [facility] with any accomodation questions. Patient agreed.

## 2015-01-05 DIAGNOSIS — R1311 Dysphagia, oral phase: Secondary | ICD-10-CM | POA: Diagnosis not present

## 2015-01-05 DIAGNOSIS — R633 Feeding difficulties: Secondary | ICD-10-CM | POA: Diagnosis not present

## 2015-01-05 DIAGNOSIS — R293 Abnormal posture: Secondary | ICD-10-CM | POA: Diagnosis not present

## 2015-01-06 DIAGNOSIS — R1311 Dysphagia, oral phase: Secondary | ICD-10-CM | POA: Diagnosis not present

## 2015-01-06 DIAGNOSIS — R293 Abnormal posture: Secondary | ICD-10-CM | POA: Diagnosis not present

## 2015-01-06 DIAGNOSIS — R633 Feeding difficulties: Secondary | ICD-10-CM | POA: Diagnosis not present

## 2015-01-06 NOTE — Telephone Encounter (Signed)
Encounter closed

## 2015-01-07 DIAGNOSIS — R633 Feeding difficulties: Secondary | ICD-10-CM | POA: Diagnosis not present

## 2015-01-07 DIAGNOSIS — R293 Abnormal posture: Secondary | ICD-10-CM | POA: Diagnosis not present

## 2015-01-07 DIAGNOSIS — R1311 Dysphagia, oral phase: Secondary | ICD-10-CM | POA: Diagnosis not present

## 2015-01-10 DIAGNOSIS — R633 Feeding difficulties: Secondary | ICD-10-CM | POA: Diagnosis not present

## 2015-01-10 DIAGNOSIS — R293 Abnormal posture: Secondary | ICD-10-CM | POA: Diagnosis not present

## 2015-01-10 DIAGNOSIS — R1311 Dysphagia, oral phase: Secondary | ICD-10-CM | POA: Diagnosis not present

## 2015-01-11 DIAGNOSIS — R1311 Dysphagia, oral phase: Secondary | ICD-10-CM | POA: Diagnosis not present

## 2015-01-11 DIAGNOSIS — R293 Abnormal posture: Secondary | ICD-10-CM | POA: Diagnosis not present

## 2015-01-11 DIAGNOSIS — R633 Feeding difficulties: Secondary | ICD-10-CM | POA: Diagnosis not present

## 2015-01-12 DIAGNOSIS — R293 Abnormal posture: Secondary | ICD-10-CM | POA: Diagnosis not present

## 2015-01-12 DIAGNOSIS — R633 Feeding difficulties: Secondary | ICD-10-CM | POA: Diagnosis not present

## 2015-01-12 DIAGNOSIS — R1311 Dysphagia, oral phase: Secondary | ICD-10-CM | POA: Diagnosis not present

## 2015-01-13 DIAGNOSIS — R293 Abnormal posture: Secondary | ICD-10-CM | POA: Diagnosis not present

## 2015-01-13 DIAGNOSIS — R633 Feeding difficulties: Secondary | ICD-10-CM | POA: Diagnosis not present

## 2015-01-13 DIAGNOSIS — R1311 Dysphagia, oral phase: Secondary | ICD-10-CM | POA: Diagnosis not present

## 2015-01-14 DIAGNOSIS — R633 Feeding difficulties: Secondary | ICD-10-CM | POA: Diagnosis not present

## 2015-01-14 DIAGNOSIS — R293 Abnormal posture: Secondary | ICD-10-CM | POA: Diagnosis not present

## 2015-01-14 DIAGNOSIS — R1311 Dysphagia, oral phase: Secondary | ICD-10-CM | POA: Diagnosis not present

## 2015-01-17 DIAGNOSIS — R293 Abnormal posture: Secondary | ICD-10-CM | POA: Diagnosis not present

## 2015-01-17 DIAGNOSIS — R1311 Dysphagia, oral phase: Secondary | ICD-10-CM | POA: Diagnosis not present

## 2015-01-17 DIAGNOSIS — R633 Feeding difficulties: Secondary | ICD-10-CM | POA: Diagnosis not present

## 2015-01-18 ENCOUNTER — Non-Acute Institutional Stay (SKILLED_NURSING_FACILITY): Payer: Medicare Other | Admitting: Internal Medicine

## 2015-01-18 DIAGNOSIS — E46 Unspecified protein-calorie malnutrition: Secondary | ICD-10-CM | POA: Diagnosis not present

## 2015-01-18 DIAGNOSIS — R633 Feeding difficulties: Secondary | ICD-10-CM | POA: Diagnosis not present

## 2015-01-18 DIAGNOSIS — E1165 Type 2 diabetes mellitus with hyperglycemia: Secondary | ICD-10-CM

## 2015-01-18 DIAGNOSIS — F329 Major depressive disorder, single episode, unspecified: Secondary | ICD-10-CM | POA: Diagnosis not present

## 2015-01-18 DIAGNOSIS — G40909 Epilepsy, unspecified, not intractable, without status epilepticus: Secondary | ICD-10-CM

## 2015-01-18 DIAGNOSIS — R293 Abnormal posture: Secondary | ICD-10-CM | POA: Diagnosis not present

## 2015-01-18 DIAGNOSIS — F39 Unspecified mood [affective] disorder: Secondary | ICD-10-CM | POA: Diagnosis not present

## 2015-01-18 DIAGNOSIS — IMO0002 Reserved for concepts with insufficient information to code with codable children: Secondary | ICD-10-CM

## 2015-01-18 DIAGNOSIS — F419 Anxiety disorder, unspecified: Secondary | ICD-10-CM | POA: Diagnosis not present

## 2015-01-18 DIAGNOSIS — I10 Essential (primary) hypertension: Secondary | ICD-10-CM | POA: Diagnosis not present

## 2015-01-18 DIAGNOSIS — E039 Hypothyroidism, unspecified: Secondary | ICD-10-CM

## 2015-01-18 DIAGNOSIS — R1311 Dysphagia, oral phase: Secondary | ICD-10-CM | POA: Diagnosis not present

## 2015-01-19 DIAGNOSIS — R633 Feeding difficulties: Secondary | ICD-10-CM | POA: Diagnosis not present

## 2015-01-19 DIAGNOSIS — R293 Abnormal posture: Secondary | ICD-10-CM | POA: Diagnosis not present

## 2015-01-19 DIAGNOSIS — R1311 Dysphagia, oral phase: Secondary | ICD-10-CM | POA: Diagnosis not present

## 2015-01-20 DIAGNOSIS — R633 Feeding difficulties: Secondary | ICD-10-CM | POA: Diagnosis not present

## 2015-01-20 DIAGNOSIS — R1311 Dysphagia, oral phase: Secondary | ICD-10-CM | POA: Diagnosis not present

## 2015-01-20 DIAGNOSIS — R293 Abnormal posture: Secondary | ICD-10-CM | POA: Diagnosis not present

## 2015-01-20 LAB — HEPATIC FUNCTION PANEL
AST: 11 U/L — AB (ref 13–35)
Alkaline Phosphatase: 121 U/L (ref 25–125)
Bilirubin, Total: 0.2 mg/dL

## 2015-01-20 LAB — BASIC METABOLIC PANEL
BUN: 26 mg/dL — AB (ref 4–21)
CREATININE: 0.5 mg/dL (ref 0.5–1.1)
GLUCOSE: 195 mg/dL
POTASSIUM: 5.2 mmol/L (ref 3.4–5.3)
Sodium: 137 mmol/L (ref 137–147)

## 2015-01-20 LAB — TSH: TSH: 0.79 u[IU]/mL (ref 0.41–5.90)

## 2015-01-20 LAB — HEMOGLOBIN A1C: Hemoglobin A1C: 6

## 2015-01-20 LAB — CBC AND DIFFERENTIAL
HCT: 41 % (ref 36–46)
Hemoglobin: 13.2 g/dL (ref 12.0–16.0)
PLATELETS: 344 10*3/uL (ref 150–399)
WBC: 7.8 10*3/mL

## 2015-01-21 DIAGNOSIS — R293 Abnormal posture: Secondary | ICD-10-CM | POA: Diagnosis not present

## 2015-01-21 DIAGNOSIS — R1311 Dysphagia, oral phase: Secondary | ICD-10-CM | POA: Diagnosis not present

## 2015-01-21 DIAGNOSIS — R633 Feeding difficulties: Secondary | ICD-10-CM | POA: Diagnosis not present

## 2015-01-21 NOTE — Progress Notes (Addendum)
Patient ID: HELI DINO, female   DOB: 30-Jan-1951, 64 y.o.   MRN: [id number]               PROGRESS NOTE  DATE:  01/18/2015               FACILITY: [facility]                     LEVEL OF CARE:   SNF   Acute Visit   CHIEF COMPLAINT:  Review prior to dental surgery.    HISTORY OF PRESENT ILLNESS:  I have been asked to review this woman prior to oral surgery.     The patient has been in the building for probably six years or more.  She was largely brought into the nursing home due to severe chronic venous stasis, chronic lymphedema, and venous stasis ulcerations.    She is also largely bedbound and immobile.  I think this has largely been of her own volition.    She is a type 2 diabetic, on insulin.    She has not had any cardiorespiratory issues that I am aware of.    She does have a longstanding history of seizures, controlled on valproic acid.    She has worsening essential tremors.    CURRENT MEDICATIONS:   Medication list is reviewed.              Synthroid 150 mcg a day.    Depakote 500 twice a day.    Baclofen 10 mg twice a day.    Primidone 50 mg twice a day.    Lasix 20 mg twice a day.    Lopressor 25 daily.    LABORATORY DATA:  Last lab work from 08/02/2014 showed:    Basic metabolic panel was essentially normal other than a glucose of 116, a BUN of 33, a creatinine of 1.03.      REVIEW OF SYSTEMS:                CHEST/RESPIRATORY:  No cough.  No shortness of breath.  CARDIAC:   No chest pain.    GI:  No nausea, vomiting or diarrhea.                                   PHYSICAL EXAMINATION:   VITAL SIGNS:   O2 SATURATIONS:  97% on room air.   PULSE:  70 and strong.   RESPIRATIONS:  18 and unlabored.   CHEST/RESPIRATORY:  Clear air entry bilaterally.   CARDIOVASCULAR:  CARDIAC:   Heart sounds are normal.  There are no murmurs.  She appears to be euvolemic.   GASTROINTESTINAL:  ABDOMEN:   Slightly distended.  No tenderness.  No masses.     CIRCULATION:   EDEMA/VARICOSITIES:  Extremities:  Severe bilateral venous stasis and lymphedema.    ASSESSMENT/PLAN:                        Type 2 diabetes.  She is no longer on insulin or anything for her diabetes, in fact.  The patient actually refused CBG testing.  Nevertheless, she presented with a hyperosmolar state in 2014 and we probably should check a hemoglobin A1c as well as CBG on her.  Hemoglobin A1c was 5.6 on 07/05/2014.    Seizure disorder.  Apparently, this onsetted in childhood.  She told me she had not had  a generalized seizure in over 6-7 years, certainly not since she has been in this building.  She will need a check of her valproic acid level.    Hypothyroidism.  On replacement, 150 mcg daily.  TSH was last checked in July at 3.711.  This will need to be rechecked.    Severe bilateral venous stasis with lymphedema.  At one point when she was more mobile, we had a lot of problems with recurrent lower extremity ulcerations.  In fact, I first met this patient at the wound care center.  She was not very compliant with ongoing compression.  Fortunately, she has not had any venous stasis wounds or any diabetic lower extremity wounds.     She needs followup labs, monitoring of her diabetes.

## 2015-01-24 DIAGNOSIS — R293 Abnormal posture: Secondary | ICD-10-CM | POA: Diagnosis not present

## 2015-01-24 DIAGNOSIS — R633 Feeding difficulties: Secondary | ICD-10-CM | POA: Diagnosis not present

## 2015-01-24 DIAGNOSIS — R1311 Dysphagia, oral phase: Secondary | ICD-10-CM | POA: Diagnosis not present

## 2015-01-25 DIAGNOSIS — R1311 Dysphagia, oral phase: Secondary | ICD-10-CM | POA: Diagnosis not present

## 2015-01-25 DIAGNOSIS — R633 Feeding difficulties: Secondary | ICD-10-CM | POA: Diagnosis not present

## 2015-01-25 DIAGNOSIS — R293 Abnormal posture: Secondary | ICD-10-CM | POA: Diagnosis not present

## 2015-01-26 DIAGNOSIS — R633 Feeding difficulties: Secondary | ICD-10-CM | POA: Diagnosis not present

## 2015-01-26 DIAGNOSIS — R293 Abnormal posture: Secondary | ICD-10-CM | POA: Diagnosis not present

## 2015-01-26 DIAGNOSIS — R1311 Dysphagia, oral phase: Secondary | ICD-10-CM | POA: Diagnosis not present

## 2015-02-22 DIAGNOSIS — R9431 Abnormal electrocardiogram [ECG] [EKG]: Secondary | ICD-10-CM | POA: Diagnosis not present

## 2015-03-07 ENCOUNTER — Non-Acute Institutional Stay (SKILLED_NURSING_FACILITY): Payer: Medicare Other | Admitting: Nurse Practitioner

## 2015-03-07 DIAGNOSIS — G250 Essential tremor: Secondary | ICD-10-CM | POA: Diagnosis not present

## 2015-03-07 DIAGNOSIS — IMO0002 Reserved for concepts with insufficient information to code with codable children: Secondary | ICD-10-CM

## 2015-03-07 DIAGNOSIS — E039 Hypothyroidism, unspecified: Secondary | ICD-10-CM | POA: Diagnosis not present

## 2015-03-07 DIAGNOSIS — G40909 Epilepsy, unspecified, not intractable, without status epilepticus: Secondary | ICD-10-CM

## 2015-03-07 DIAGNOSIS — E1165 Type 2 diabetes mellitus with hyperglycemia: Secondary | ICD-10-CM

## 2015-03-07 DIAGNOSIS — I10 Essential (primary) hypertension: Secondary | ICD-10-CM | POA: Diagnosis not present

## 2015-03-07 DIAGNOSIS — I878 Other specified disorders of veins: Secondary | ICD-10-CM | POA: Diagnosis not present

## 2015-03-07 NOTE — Progress Notes (Signed)
Patient ID: [name], female   DOB: 19-Aug-1951, 64 y.o.   MRN: [id number]    Nursing Home Location:  [facility]   Place of Service: SNF (31)  PCP: Terald Sleeper, MD  Allergies  Allergen Reactions  . Codeine   . Morphine And Related   . Naproxen   . Penicillins     Chief Complaint  Patient presents with  . Medical Management of Chronic Issues    HPI: 64 y.o. 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 Patient seen today for follow up on chronic conditions. Pt contts to refuse personal care. Recently with increase yeast to right upper leg, this has now improved. Pt with ongoing neurology follow up for evaluation of seizure and tremors. Primidone was increased to help with tremors.  Review of Systems:  Review of Systems  Constitutional: Negative for fever and chills.  Respiratory: Negative for cough and shortness of breath.   Cardiovascular: Negative for chest pain, palpitations and leg swelling.  Gastrointestinal: Negative for heartburn, abdominal pain, diarrhea and constipation.  Genitourinary: Negative for dysuria and urgency.  Musculoskeletal: Negative for falls.  Neurological: Positive for tremors. Negative for dizziness and headaches.  Psychiatric/Behavioral: Negative for depression and memory loss. The patient does not have insomnia.      Past Medical History  Diagnosis Date  . Hypertension   . Thyroid disease     Hypothyroidism  . CHF (congestive heart failure)     Status post 2-D echocardiogram 08/12/2010, EF 60-65%, grade 1 diastolic dysfunction  . Epilepsy   . Venous insufficiency   . Morbid obesity   . Venous stasis ulcers   . Essential tremor    Past Surgical History  Procedure Laterality Date  . Bilateral cataract surgery with lens implants    . Right total hip replacement    . Left hip hemiarthroplasty    . Right knee open reduction and internal fixation of patella fracture      . Abdominal hysterectomy     Social History:   reports that she has never smoked. She has never used smokeless tobacco. She reports that she does not drink alcohol or use illicit drugs.  Family History  Problem Relation Age of Onset  . Other Mother     Medications: Patient's Medications  New Prescriptions   No medications on file  Previous Medications   BACLOFEN (LIORESAL) 10 MG TABLET    Take 10 mg by mouth 2 (two) times daily. For tremors   CALCIUM CARBONATE (TUMS - DOSED IN MG ELEMENTAL CALCIUM) 500 MG CHEWABLE TABLET    Chew 1 tablet by mouth daily as needed for heartburn (Pt request).   DIVALPROEX (DEPAKOTE) 500 MG DR TABLET    Take 500 mg by mouth 2 (two) times daily. For seizure disorder   FUROSEMIDE (LASIX) 20 MG TABLET    Take 20 mg by mouth daily. Takes in the evening   LEVOTHYROXINE (SYNTHROID, LEVOTHROID) 150 MCG TABLET    Take 150 mcg by mouth daily before breakfast. For hypothyroidism   MENTHOL, TOPICAL ANALGESIC, (BIOFREEZE) 4 % GEL    Apply topically 2 (two) times daily.   METOPROLOL TARTRATE (LOPRESSOR) 25 MG TABLET    Take 25 mg by mouth daily.   MULTIPLE VITAMINS-MINERALS (CERTAGEN PO)    Take by mouth daily.   PRIMIDONE (MYSOLINE) 50 MG TABLET    Take 1 tablet (50 mg total) by mouth 2 (two) times daily.  TRAMADOL-ACETAMINOPHEN (ULTRACET) 37.5-325 MG PER TABLET    Take two tablets by mouth every 6 hours as needed for pain  Modified Medications   No medications on file  Discontinued Medications   No medications on file       Physical Exam: Physical Exam  Constitutional: No distress.  HENT:  Head: Normocephalic and atraumatic.  Right Ear: External ear normal.  Left Ear: External ear normal.  Nose: Nose normal.  Mouth/Throat: Oropharynx is clear and moist. No oropharyngeal exudate.  Eyes: Conjunctivae and EOM are normal. Pupils are equal, round, and reactive to light.  Neck: Normal range of motion. Neck supple.  Cardiovascular: Normal rate and regular  rhythm.   Pulmonary/Chest: Effort normal and breath sounds normal. No respiratory distress. She has no wheezes.  Abdominal: Soft. Bowel sounds are normal. She exhibits no distension. There is no tenderness.  Musculoskeletal: She exhibits edema (unchanged, bilateral LE).  Neurological: She is alert. She displays tremor.  Skin: Skin is warm. She is not diaphoretic. No erythema.  Skin changes to bilateral LE, skin tear to right LE Using Vaseline gauge and compression wraps per wound care.   Psychiatric: Memory and affect normal.  Nursing note and vitals reviewed.    Filed Vitals:   03/07/15 1604  BP: 132/78  Pulse: 68  Temp: 98 F (36.7 C)  Resp: 16      Labs reviewed: Basic Metabolic Panel:  Recent Labs  35/64/68 1310  NA 133*  K 5.9*  BUN 31*  CREATININE 0.6   Liver Function Tests:  Recent Labs  07/23/14 1310  AST 20  ALT 13  ALKPHOS 110   No results for input(s): LIPASE, AMYLASE in the last 8760 hours. No results for input(s): AMMONIA in the last 8760 hours. CBC:  Recent Labs  07/06/14 0230  WBC 8.1  HGB 11.3*  HCT 36  PLT 328   Cardiac Enzymes: No results for input(s): CKTOTAL, CKMB, CKMBINDEX, TROPONINI in the last 8760 hours. BNP: Invalid input(s): POCBNP CBG: No results for input(s): GLUCAP in the last 8760 hours. TSH:  Recent Labs  07/06/14 0230  TSH 3.71   A1C: Lab Results  Component Value Date   HGBA1C 5.6 07/06/2014   CBC NO Diff (Complete Blood Count)  Result: 03/31/2014 12:25 PM ( Status: F ) C  WBC 8.3 4.0-10.5 K/uL SLN  RBC 3.90 3.87-5.11 MIL/uL SLN  Hemoglobin 11.0 L 12.0-15.0 g/dL SLN  Hematocrit 39.7 L 36.0-46.0 % SLN  MCV 86.4 78.0-100.0 fL SLN  MCH 28.2 26.0-34.0 pg SLN  MCHC 32.6 30.0-36.0 g/dL SLN  RDW 27.9 H 82.2-33.1 % SLN  Platelet Count 338 150-400 K/uL SLN  TSH, Ultrasensitive  Result: 03/31/2014 1:06 PM ( Status: F )  TSH 1.565 0.350-4.500 uIU/mL SLN  Hemoglobin A1C  Result: 03/31/2014 2:55 PM ( Status: F )    Hemoglobin A1C 5.8 H <5.7 % SLN C  Estimated Average Glucose 120 H <117 mg/dL  CBC with Diff    Result: 07/05/2014 11:10 PM   ( Status: F )     C WBC 8.1     4.0-10.5 K/uL SLN   RBC 3.97     3.87-5.11 MIL/uL SLN   Hemoglobin 11.3   L 12.0-15.0 g/dL SLN   Hematocrit 69.6   L 36.0-46.0 % SLN   MCV 89.4     78.0-100.0 fL SLN   MCH 28.5     26.0-34.0 pg SLN   MCHC 31.8     30.0-36.0 g/dL SLN  RDW 16.5   H 11.5-15.5 % SLN   Platelet Count 382     150-400 K/uL SLN   Granulocyte % 58     43-77 % SLN   Absolute Gran 4.7     1.7-7.7 K/uL SLN   Lymph % 35     12-46 % SLN   Absolute Lymph 2.8     0.7-4.0 K/uL SLN   Mono % 6     3-12 % SLN   Absolute Mono 0.5     0.1-1.0 K/uL SLN   Eos % 1     0-5 % SLN   Absolute Eos 0.1     0.0-0.7 K/uL SLN   Baso % 0     0-1 % SLN   Absolute Baso 0.0     0.0-0.1 K/uL SLN   Smear Review Criteria for review not met  SLN   Comprehensive Metabolic Panel    Result: 07/06/2014 1:35 AM   ( Status: F )       Sodium 133   L 135-145 mEq/L SLN   Potassium 6.1   H 3.5-5.3 mEq/L SLN C Chloride 96     96-112 mEq/L SLN   CO2 24     19-32 mEq/L SLN   Glucose 64   L 70-99 mg/dL SLN C BUN 21     6-23 mg/dL SLN   Creatinine 0.56     0.50-1.10 mg/dL SLN   Bilirubin, Total 0.2     0.2-1.2 mg/dL SLN   Alkaline Phosphatase 132   H 39-117 U/L SLN   AST/SGOT 15     0-37 U/L SLN   ALT/SGPT <8     0-35 U/L SLN   Total Protein 6.5     6.0-8.3 g/dL SLN   Albumin 3.1   L 3.5-5.2 g/dL SLN   Calcium 8.3   L 8.4-10.5 mg/dL SLN   TSH, Ultrasensitive    Result: 07/06/2014 2:11 AM   ( Status: F )       TSH 3.711     0.350-4.500 uIU/mL SLN   Hemoglobin A1C    Result: 07/06/2014 1:32 AM   ( Status: F )       Hemoglobin A1C 5.6     <5.7 % SLN C Estimated Average Glucose 114     <117 mg/dL SLN   Valproic Acid (Depakene)    Result: 07/06/2014 6:48 AM   ( Status: F )       Valproic Acid (Depakene) 73.2     50.0-100.0 ug/mL SLN  Basic Metabolic Panel    Result: 07/28/2014 10:43 AM   (  Status: F )     C Sodium 135     135-145 mEq/L SLN   Potassium 4.8     3.5-5.3 mEq/L SLN   Chloride 96     96-112 mEq/L SLN   CO2 26     19-32 mEq/L SLN   Glucose 69   L 70-99 mg/dL SLN   BUN 28   H 6-23 mg/dL SLN   Creatinine 0.66     0.50-1.10 mg/dL SLN   Calcium 8.7     8.4-10.5 mg/dL SLN CBC NO Diff (Complete Blood Count)    Result: 01/19/2015 10:54 AM   ( Status: F )       WBC 7.8     4.0-10.5 K/uL SLN   RBC 4.70     3.87-5.11 MIL/uL SLN   Hemoglobin 13.2     12.0-15.0 g/dL SLN   Hematocrit  41.0     36.0-46.0 % SLN   MCV 87.2     78.0-100.0 fL SLN   MCH 28.1     26.0-34.0 pg SLN   MCHC 32.2     30.0-36.0 g/dL SLN   RDW 17.3   H 11.5-15.5 % SLN   Platelet Count 344     150-400 K/uL SLN   MPV 9.8     8.6-12.4 fL SLN   Comprehensive Metabolic Panel    Result: 01/19/2015 12:06 PM   ( Status: F )       Sodium 137     135-145 mEq/L SLN   Potassium 5.2     3.5-5.3 mEq/L SLN   Chloride 105     96-112 mEq/L SLN   CO2 19     19-32 mEq/L SLN   Glucose 195   H 70-99 mg/dL SLN   BUN 26   H 6-23 mg/dL SLN   Creatinine 0.46   L 0.50-1.10 mg/dL SLN   Bilirubin, Total 0.2     0.2-1.2 mg/dL SLN   Alkaline Phosphatase 121   H 39-117 U/L SLN   AST/SGOT 11     0-37 U/L SLN   ALT/SGPT <8     0-35 U/L SLN   Total Protein 6.6     6.0-8.3 g/dL SLN   Albumin 3.2   L 3.5-5.2 g/dL SLN   Calcium 8.7     8.4-10.5 mg/dL SLN   TSH    Result: 01/19/2015 1:33 PM   ( Status: F )       TSH 0.793     0.350-4.500 uIU/mL SLN   Hemoglobin A1c with eAG    Result: 01/19/2015 12:30 PM   ( Status: F )       Hemoglobin A1C 6.0   H <5.7 % SLN C Estimated Average Glucose 126   H <117 mg/dL SLN   Valproic Acid (Depakene)    Result: 01/19/2015 5:21 PM   ( Status: F )       Valproic Acid (Depakene) 52.0     50.0-100.0 ug/mL SLN   Assessment/Plan   1. Hypothyroidism, unspecified hypothyroidism type TSH drawn in feb, well controlled on current medications, conts  Synthroid 150 mcg  2. Essential hypertension -controlled  on current regimen   3. Tremor, essential -following with neurology, conts on primidone   4. Seizure disorder -without recent seizure, conts on depakote   5. Lower extremity venous stasis With ulcers that have improved. Remains with one small open area, no infection noted, swelling has improved. conts to be followed closely by treatment nurse  6. Diabetes -A1c remains at [place], pt remains off medications

## 2015-03-29 DIAGNOSIS — F419 Anxiety disorder, unspecified: Secondary | ICD-10-CM | POA: Diagnosis not present

## 2015-03-29 DIAGNOSIS — F39 Unspecified mood [affective] disorder: Secondary | ICD-10-CM | POA: Diagnosis not present

## 2015-03-29 DIAGNOSIS — F339 Major depressive disorder, recurrent, unspecified: Secondary | ICD-10-CM | POA: Diagnosis not present

## 2015-04-13 DIAGNOSIS — F339 Major depressive disorder, recurrent, unspecified: Secondary | ICD-10-CM | POA: Diagnosis not present

## 2015-04-13 DIAGNOSIS — F39 Unspecified mood [affective] disorder: Secondary | ICD-10-CM | POA: Diagnosis not present

## 2015-04-13 DIAGNOSIS — F419 Anxiety disorder, unspecified: Secondary | ICD-10-CM | POA: Diagnosis not present

## 2015-05-02 ENCOUNTER — Non-Acute Institutional Stay (SKILLED_NURSING_FACILITY): Payer: Medicare Other | Admitting: Nurse Practitioner

## 2015-05-02 DIAGNOSIS — G40909 Epilepsy, unspecified, not intractable, without status epilepticus: Secondary | ICD-10-CM

## 2015-05-02 DIAGNOSIS — E1165 Type 2 diabetes mellitus with hyperglycemia: Secondary | ICD-10-CM

## 2015-05-02 DIAGNOSIS — E039 Hypothyroidism, unspecified: Secondary | ICD-10-CM

## 2015-05-02 DIAGNOSIS — I878 Other specified disorders of veins: Secondary | ICD-10-CM

## 2015-05-02 DIAGNOSIS — F39 Unspecified mood [affective] disorder: Secondary | ICD-10-CM

## 2015-05-02 DIAGNOSIS — G250 Essential tremor: Secondary | ICD-10-CM | POA: Diagnosis not present

## 2015-05-02 DIAGNOSIS — IMO0002 Reserved for concepts with insufficient information to code with codable children: Secondary | ICD-10-CM

## 2015-05-02 DIAGNOSIS — B372 Candidiasis of skin and nail: Secondary | ICD-10-CM

## 2015-05-02 DIAGNOSIS — I5032 Chronic diastolic (congestive) heart failure: Secondary | ICD-10-CM | POA: Diagnosis not present

## 2015-05-02 NOTE — Progress Notes (Signed)
Patient ID: [name], female   DOB: 07/26/1951, 64 y.o.   MRN: [id number]    Nursing Home Location:  [place] of Service: SNF (41)  PCP: Cyndee Brightly, MD  Allergies  Allergen Reactions  . Codeine   . Morphine And Related   . Naproxen   . Penicillins     Chief Complaint  Patient presents with  . Medical Management of Chronic Issues    HPI: 64 y.o. 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 Patient seen today for follow up on chronic conditions. Pt with ongoing follow up with psych services however does not feel like this is providing her any benefits. Treatment nurse following pt due to LE ulcers which are stable. Staff reports pt is resist to personal care which is not new. Pt does not have any complaints at this time.   Review of Systems:  Review of Systems  Constitutional: Negative for fever, chills and weight loss.  HENT: Negative for tinnitus.   Respiratory: Negative for cough, sputum production and shortness of breath.   Cardiovascular: Negative for chest pain, palpitations and leg swelling.  Gastrointestinal: Negative for heartburn, abdominal pain, diarrhea and constipation.  Genitourinary: Negative for dysuria, urgency and frequency.  Musculoskeletal: Negative for myalgias, back pain, joint pain and falls.  Skin:       LE skin changes   Neurological: Positive for tremors. Negative for dizziness and headaches.  Psychiatric/Behavioral: Negative for depression and memory loss. The patient does not have insomnia.      Past Medical History  Diagnosis Date  . Hypertension   . Thyroid disease     Hypothyroidism  . CHF (congestive heart failure)     Status post 2-D echocardiogram 08/12/2010, EF 45-03%, grade 1 diastolic dysfunction  . Epilepsy   . Venous insufficiency   . Morbid obesity   . Venous stasis ulcers   . Essential tremor    Past Surgical History  Procedure Laterality  Date  . Bilateral cataract surgery with lens implants    . Right total hip replacement    . Left hip hemiarthroplasty    . Right knee open reduction and internal fixation of patella fracture    . Abdominal hysterectomy     Social History:   reports that she has never smoked. She has never used smokeless tobacco. She reports that she does not drink alcohol or use illicit drugs.  Family History  Problem Relation Age of Onset  . Other Mother     Medications: Patient's Medications  New Prescriptions   No medications on file  Previous Medications   BACLOFEN (LIORESAL) 10 MG TABLET    Take 10 mg by mouth 2 (two) times daily. For tremors   CALCIUM CARBONATE (TUMS - DOSED IN MG ELEMENTAL CALCIUM) 500 MG CHEWABLE TABLET    Chew 1 tablet by mouth daily as needed for heartburn (Pt request).   DIVALPROEX (DEPAKOTE) 500 MG DR TABLET    Take 500 mg by mouth 2 (two) times daily. For seizure disorder   FUROSEMIDE (LASIX) 20 MG TABLET    Take 20 mg by mouth daily. Takes in the evening   LEVOTHYROXINE (SYNTHROID, LEVOTHROID) 150 MCG TABLET    Take 150 mcg by mouth daily before breakfast. For hypothyroidism   MENTHOL, TOPICAL ANALGESIC, (BIOFREEZE) 4 % GEL    Apply topically 2 (two) times daily.   METOPROLOL TARTRATE (LOPRESSOR) 25 MG TABLET  Take 25 mg by mouth daily.   MULTIPLE VITAMINS-MINERALS (CERTAGEN PO)    Take by mouth daily.   PRIMIDONE (MYSOLINE) 50 MG TABLET    Take 1 tablet (50 mg total) by mouth 2 (two) times daily.   TRAMADOL-ACETAMINOPHEN (ULTRACET) 37.5-325 MG PER TABLET    Take two tablets by mouth every 6 hours as needed for pain  Modified Medications   No medications on file  Discontinued Medications   No medications on file       Physical Exam: Physical Exam  Constitutional: No distress.  HENT:  Head: Normocephalic and atraumatic.  Right Ear: External ear normal.  Left Ear: External ear normal.  Nose: Nose normal.  Mouth/Throat: Oropharynx is clear and moist. No  oropharyngeal exudate.  Eyes: Conjunctivae and EOM are normal. Pupils are equal, round, and reactive to light.  Neck: Normal range of motion. Neck supple.  Cardiovascular: Normal rate and regular rhythm.   Pulmonary/Chest: Effort normal and breath sounds normal. No respiratory distress. She has no wheezes.  Abdominal: Soft. Bowel sounds are normal. She exhibits no distension. There is no tenderness.  Musculoskeletal: She exhibits edema (unchanged, bilateral LE).  Neurological: She is alert. She displays tremor.  Skin: Skin is warm. She is not diaphoretic. There is erythema (to bilateral thighs, yeast rash).  Skin changes to bilateral LE, skin tear to right LE Using Vaseline gauge and compression wraps per wound care.   Psychiatric: Memory and affect normal.  Nursing note and vitals reviewed.    There were no vitals filed for this visit. Pt does not allow for VS   Labs reviewed: Basic Metabolic Panel:  Recent Labs  07/23/14 1310  NA 133*  K 5.9*  BUN 31*  CREATININE 0.6   Liver Function Tests:  Recent Labs  07/23/14 1310  AST 20  ALT 13  ALKPHOS 110   No results for input(s): LIPASE, AMYLASE in the last 8760 hours. No results for input(s): AMMONIA in the last 8760 hours. CBC:  Recent Labs  07/06/14 0230  WBC 8.1  HGB 11.3*  HCT 36  PLT 328   Cardiac Enzymes: No results for input(s): CKTOTAL, CKMB, CKMBINDEX, TROPONINI in the last 8760 hours. BNP: Invalid input(s): POCBNP CBG: No results for input(s): GLUCAP in the last 8760 hours. TSH:  Recent Labs  07/06/14 0230  TSH 3.71   A1C: Lab Results  Component Value Date   HGBA1C 5.6 07/06/2014   CBC NO Diff (Complete Blood Count)  Result: 03/31/2014 12:25 PM ( Status: F ) C  WBC 8.3 4.0-10.5 K/uL SLN  RBC 3.90 3.87-5.11 MIL/uL SLN  Hemoglobin 11.0 L 12.0-15.0 g/dL SLN  Hematocrit 33.7 L 36.0-46.0 % SLN  MCV 86.4 78.0-100.0 fL SLN  MCH 28.2 26.0-34.0 pg SLN  MCHC 32.6 30.0-36.0 g/dL SLN  RDW 17.7 H  11.5-15.5 % SLN  Platelet Count 338 150-400 K/uL SLN  TSH, Ultrasensitive  Result: 03/31/2014 1:06 PM ( Status: F )  TSH 1.565 0.350-4.500 uIU/mL SLN  Hemoglobin A1C  Result: 03/31/2014 2:55 PM ( Status: F )  Hemoglobin A1C 5.8 H <5.7 % SLN C  Estimated Average Glucose 120 H <117 mg/dL  CBC with Diff    Result: 07/05/2014 11:10 PM   ( Status: F )     C WBC 8.1     4.0-10.5 K/uL SLN   RBC 3.97     3.87-5.11 MIL/uL SLN   Hemoglobin 11.3   L 12.0-15.0 g/dL SLN   Hematocrit 35.5   L  36.0-46.0 % SLN   MCV 89.4     78.0-100.0 fL SLN   MCH 28.5     26.0-34.0 pg SLN   MCHC 31.8     30.0-36.0 g/dL SLN   RDW 16.5   H 11.5-15.5 % SLN   Platelet Count 382     150-400 K/uL SLN   Granulocyte % 58     43-77 % SLN   Absolute Gran 4.7     1.7-7.7 K/uL SLN   Lymph % 35     12-46 % SLN   Absolute Lymph 2.8     0.7-4.0 K/uL SLN   Mono % 6     3-12 % SLN   Absolute Mono 0.5     0.1-1.0 K/uL SLN   Eos % 1     0-5 % SLN   Absolute Eos 0.1     0.0-0.7 K/uL SLN   Baso % 0     0-1 % SLN   Absolute Baso 0.0     0.0-0.1 K/uL SLN   Smear Review Criteria for review not met  SLN   Comprehensive Metabolic Panel    Result: 07/06/2014 1:35 AM   ( Status: F )       Sodium 133   L 135-145 mEq/L SLN   Potassium 6.1   H 3.5-5.3 mEq/L SLN C Chloride 96     96-112 mEq/L SLN   CO2 24     19-32 mEq/L SLN   Glucose 64   L 70-99 mg/dL SLN C BUN 21     6-23 mg/dL SLN   Creatinine 0.56     0.50-1.10 mg/dL SLN   Bilirubin, Total 0.2     0.2-1.2 mg/dL SLN   Alkaline Phosphatase 132   H 39-117 U/L SLN   AST/SGOT 15     0-37 U/L SLN   ALT/SGPT <8     0-35 U/L SLN   Total Protein 6.5     6.0-8.3 g/dL SLN   Albumin 3.1   L 3.5-5.2 g/dL SLN   Calcium 8.3   L 8.4-10.5 mg/dL SLN   TSH, Ultrasensitive    Result: 07/06/2014 2:11 AM   ( Status: F )       TSH 3.711     0.350-4.500 uIU/mL SLN   Hemoglobin A1C    Result: 07/06/2014 1:32 AM   ( Status: F )       Hemoglobin A1C 5.6     <5.7 % SLN C Estimated Average Glucose 114       <117 mg/dL SLN   Valproic Acid (Depakene)    Result: 07/06/2014 6:48 AM   ( Status: F )       Valproic Acid (Depakene) 73.2     50.0-100.0 ug/mL SLN  Basic Metabolic Panel    Result: 07/28/2014 10:43 AM   ( Status: F )     C Sodium 135     135-145 mEq/L SLN   Potassium 4.8     3.5-5.3 mEq/L SLN   Chloride 96     96-112 mEq/L SLN   CO2 26     19-32 mEq/L SLN   Glucose 69   L 70-99 mg/dL SLN   BUN 28   H 6-23 mg/dL SLN   Creatinine 0.66     0.50-1.10 mg/dL SLN   Calcium 8.7     8.4-10.5 mg/dL SLN CBC NO Diff (Complete Blood Count)    Result: 01/19/2015 10:54 AM   ( Status: F )  WBC 7.8     4.0-10.5 K/uL SLN   RBC 4.70     3.87-5.11 MIL/uL SLN   Hemoglobin 13.2     12.0-15.0 g/dL SLN   Hematocrit 41.0     36.0-46.0 % SLN   MCV 87.2     78.0-100.0 fL SLN   MCH 28.1     26.0-34.0 pg SLN   MCHC 32.2     30.0-36.0 g/dL SLN   RDW 17.3   H 11.5-15.5 % SLN   Platelet Count 344     150-400 K/uL SLN   MPV 9.8     8.6-12.4 fL SLN   Comprehensive Metabolic Panel    Result: 01/19/2015 12:06 PM   ( Status: F )       Sodium 137     135-145 mEq/L SLN   Potassium 5.2     3.5-5.3 mEq/L SLN   Chloride 105     96-112 mEq/L SLN   CO2 19     19-32 mEq/L SLN   Glucose 195   H 70-99 mg/dL SLN   BUN 26   H 6-23 mg/dL SLN   Creatinine 0.46   L 0.50-1.10 mg/dL SLN   Bilirubin, Total 0.2     0.2-1.2 mg/dL SLN   Alkaline Phosphatase 121   H 39-117 U/L SLN   AST/SGOT 11     0-37 U/L SLN   ALT/SGPT <8     0-35 U/L SLN   Total Protein 6.6     6.0-8.3 g/dL SLN   Albumin 3.2   L 3.5-5.2 g/dL SLN   Calcium 8.7     8.4-10.5 mg/dL SLN   TSH    Result: 01/19/2015 1:33 PM   ( Status: F )       TSH 0.793     0.350-4.500 uIU/mL SLN   Hemoglobin A1c with eAG    Result: 01/19/2015 12:30 PM   ( Status: F )       Hemoglobin A1C 6.0   H <5.7 % SLN C Estimated Average Glucose 126   H <117 mg/dL SLN   Valproic Acid (Depakene)    Result: 01/19/2015 5:21 PM   ( Status: F )       Valproic Acid (Depakene) 52.0      50.0-100.0 ug/mL SLN   Assessment/Plan  1. Hypothyroidism, unspecified hypothyroidism type -TSH was stable in february. Cont current synthroid 150 mcg by mouth daily   2. Tremor, essential Mysoline was increased per neurology to twice daily, improved tremor on this dose.   3. Seizure disorder Without seizures, conts on depakote, following with neurology at this time.   4. DM (diabetes mellitus), type 2, uncontrolled A!c at goal on labs in february on no medications, diet controlled.   5. Mood disorder -follows with pysch however does not feel like she has a need for any medications related to mood, denies anxieties or depression.   6. Lower extremity venous stasis -following with treatment nurse due to chronic venous stasis with ulcers which have been stable  7. Chronic diastolic CHF (congestive heart failure) -stable on lasix and lopressor, no signs of fluid overload. -pt does not allow for weights but no increased edema, shortness of breath or abnormal lung sounds noted  8. Candidal skin infection -exacerbated due to poor hygiene/ refusing personal care -treatment nurse to follow -nystatin cream BID for 14 days.

## 2015-05-17 DIAGNOSIS — G40909 Epilepsy, unspecified, not intractable, without status epilepticus: Secondary | ICD-10-CM | POA: Diagnosis not present

## 2015-05-17 DIAGNOSIS — F329 Major depressive disorder, single episode, unspecified: Secondary | ICD-10-CM | POA: Diagnosis not present

## 2015-06-14 ENCOUNTER — Other Ambulatory Visit: Payer: Self-pay

## 2015-06-14 ENCOUNTER — Other Ambulatory Visit: Payer: Self-pay | Admitting: *Deleted

## 2015-06-14 ENCOUNTER — Non-Acute Institutional Stay (SKILLED_NURSING_FACILITY): Payer: Medicare Other | Admitting: Internal Medicine

## 2015-06-14 DIAGNOSIS — G40909 Epilepsy, unspecified, not intractable, without status epilepticus: Secondary | ICD-10-CM

## 2015-06-14 DIAGNOSIS — IMO0002 Reserved for concepts with insufficient information to code with codable children: Secondary | ICD-10-CM

## 2015-06-14 DIAGNOSIS — I878 Other specified disorders of veins: Secondary | ICD-10-CM

## 2015-06-14 DIAGNOSIS — E1165 Type 2 diabetes mellitus with hyperglycemia: Secondary | ICD-10-CM

## 2015-06-14 MED ORDER — TRAMADOL-ACETAMINOPHEN 37.5-325 MG PO TABS: ORAL_TABLET | ORAL | Status: DC

## 2015-06-14 NOTE — Telephone Encounter (Signed)
[facility] 

## 2015-06-14 NOTE — Telephone Encounter (Signed)
Rx faxed to [facility] @ [phone], phone number [phone]  

## 2015-06-21 NOTE — Progress Notes (Addendum)
Patient ID: [name], female   DOB: 05/14/1951, 64 y.o.   MRN: [id number]                PROGRESS NOTE  DATE:  06/14/2015             FACILITY: [facility]                                 LEVEL OF CARE:   SNF   Routine Visit                       CHIEF COMPLAINT:  Review of general medical issues.    HISTORY OF PRESENT ILLNESS:  Mrs. Rance is a patient who has been in the building probably roughly six years.  She came here from a nearby assisted living secondary to chronic venous stasis, chronic lymphedema, venous stasis ulcerations, and chronic immobility.  She never did respond to rehabilitation.  She is largely bedbound and immobile, largely of her own volition.    She is a type 2 diabetic, on insulin.    She has a longstanding history of seizures, on valproic acid.     She has worsening essential tremors.    PAST MEDICAL HISTORY/PROBLEM LIST:                 Essential tremors.    Seizure disorder.    Normocytic anemia.    Depression.    Lower extremity venous stasis with a history of ulcerations.    Lymphedema.    Hypothyroidism.     Hypertension.    Hypernatremia.    Type 2 diabetes.    Chronic pain.    Chronic diastolic heart failure.     CURRENT MEDICATIONS:  Medication list is reviewed.                Multivitamin daily.    Synthroid 150 daily.     Depakote 500 b.i.d.      Baclofen 10 mg b.i.d.      Biofreeze to left lateral upper thigh b.i.d.     Primidone 50 mg twice daily (I believe this is for essential tremors).    Lasix 20 mg daily.    Lopressor 25 mg daily.    Ultracet 37.5/325, 2 tablets q.6 p.r.n. for pain.               LABORATORY DATA:   Last lab work was in February, at which time:    TSH 0.793.    Hemoglobin A1c 6.    Comprehensive metabolic panel:  Sodium 137, potassium 5.2, BUN 27, creatinine 0.46.    Albumin 3.2.    Liver function tests:  Normal.    CBC:  White count 7.8, hemoglobin 13.2, platelet count 344.     REVIEW OF SYSTEMS:    CHEST/RESPIRATORY:  No cough.  No shortness of breath.   CARDIAC:  No chest pain.   GI:  No nausea, vomiting, or diarrhea.     SKIN:  Extremities:  No open wounds.      PHYSICAL EXAMINATION:   VITAL SIGNS:     TEMPERATURE:  98.6.   PULSE:  76.   RESPIRATIONS:  20.     BLOOD PRESSURE:  144/86.   WEIGHT:  156 pounds.   CHEST/RESPIRATORY:  Clear air entry bilaterally.    CARDIOVASCULAR:   CARDIAC:  Heart sounds are normal.  There are no  murmurs.    GASTROINTESTINAL:   ABDOMEN:  No masses.     LIVER/SPLEEN/KIDNEYS:  No liver, no spleen.  No tenderness.    CIRCULATION:   EDEMA/VARICOSITIES:  Extremities:  Severe lower extremity venous stasis with a history of ulcerations.  With her bedbound status, her edema has largely gotten better and there have been no open wounds that I am aware of.    ASSESSMENT/PLAN:                    Type 2 diabetes.  Not on any current medications.  She has not had any recent CBGs.  Her hemoglobin A1c in February was 6.  At some time, I will repeat this.    Seizure disorder.  On valproic acid.  No recent seizures.    Severe venous stasis with a history of venous stasis ulcerations.  No recent problems here.    Hypothyroidism.  On replacement.  She will need her TSH checked next month.    Essential tremors.  She is on Primidone, I think for this, 50 mg twice a day.  This does not seem disabling.    Hypertension.  She is on a small dose of Lopressor.  Her blood pressure appears to be fairly well controlled, generally in the 140 range.

## 2015-07-04 DIAGNOSIS — Z961 Presence of intraocular lens: Secondary | ICD-10-CM | POA: Diagnosis not present

## 2015-07-04 DIAGNOSIS — E119 Type 2 diabetes mellitus without complications: Secondary | ICD-10-CM | POA: Diagnosis not present

## 2015-07-05 ENCOUNTER — Non-Acute Institutional Stay (SKILLED_NURSING_FACILITY): Payer: Medicare Other | Admitting: Internal Medicine

## 2015-07-05 DIAGNOSIS — H8111 Benign paroxysmal vertigo, right ear: Secondary | ICD-10-CM | POA: Diagnosis not present

## 2015-07-09 NOTE — Progress Notes (Addendum)
Patient ID: [name], female   DOB: 06-10-1951, 64 y.o.   MRN: [id number]                PROGRESS NOTE  DATE:  07/05/2015        FACILITY: [facility]                       LEVEL OF CARE:   SNF   Acute Visit                          CHIEF COMPLAINT:  Dizzy spell.    HISTORY OF PRESENT ILLNESS:  I was called last Friday to a report that the patient had become intensely dizzy with nausea.  She does not have a history of this.  She was not hypoglycemic.  She does have a longstanding history of seizures, on valproic acid.  I gave her Antivert over the phone as everything else seemed stable.  I am seeing her today in follow-up of this.    The patient tells me they were changing her and moved her over to the right.  All of a sudden, she felt so dizzy that she was not sure that "the bed was on the floor".   She felt somewhat unsettled and nauseated.  However, this passed very quickly.  She denies any chest pain or palpitations.  No shortness of breath.  No headache.  No change in her neurologic status.  She has not had another episode  CURRENT MEDICATIONS:  Medication list is reviewed.            Synthroid 150 daily.    Depakote 500 b.i.d.     Baclofen 10 mg twice a day.    Peridone 15 mg b.i.d. (essential tremor).      Lasix 20 q.d.    Lopressor 25 daily.     LABORATORY DATA:   Recent lab work from 01/18/2015:      Valproic acid level 52.    Hemoglobin A1c 6.    TSH 0.793.    REVIEW OF SYSTEMS:        HEENT:  No diplopia.  No headache.   CHEST/RESPIRATORY:  No shortness of breath.   CARDIAC:  No chest pain or palpitations.   GI:  No diarrhea.  No abdominal pain.   Neurologic: The patient notes not change in her strength or sensation  PHYSICAL EXAMINATION:   VITAL SIGNS:     TEMPERATURE:  98.   PULSE:  74.   RESPIRATIONS:  20.    BLOOD PRESSURE:  132/70.    GENERAL APPEARANCE:  The patient does not look to be in any distress.     HEENT:   EYES:  There is no  nystagmus.      CHEST/RESPIRATORY:  Exam is clear.       CARDIOVASCULAR:   CARDIAC:  Heart sounds are normal.  There are no murmurs.   No gallops.    GASTROINTESTINAL:   ABDOMEN:  Soft, nontender.   NEUROLOGICAL:   I cannot reproduce any vertigo with just lateral rotation of the neck.  NO nystagmus. Cranial nerves seem intact. Upper extremity strength is normal for her. She has weakness in the lower extremities but she is largely bed bound. I don't believe this is new  ASSESSMENT/PLAN:                This sounds a lot like benign positional vertigo, although  I cannot reproduce things easily.  This would be a new diagnosis for her.  I think it would be worthwhile rechecking a valproic acid level.  We will recheck her TSH, as well.  I will see if this becomes a recurrent difficulty for the patient,

## 2015-08-12 DIAGNOSIS — G252 Other specified forms of tremor: Secondary | ICD-10-CM | POA: Diagnosis not present

## 2015-08-13 ENCOUNTER — Non-Acute Institutional Stay (SKILLED_NURSING_FACILITY): Payer: Medicare Other | Admitting: Internal Medicine

## 2015-08-13 DIAGNOSIS — E039 Hypothyroidism, unspecified: Secondary | ICD-10-CM

## 2015-08-13 DIAGNOSIS — L03113 Cellulitis of right upper limb: Secondary | ICD-10-CM | POA: Diagnosis not present

## 2015-08-13 DIAGNOSIS — M7989 Other specified soft tissue disorders: Secondary | ICD-10-CM | POA: Diagnosis not present

## 2015-08-15 DIAGNOSIS — G252 Other specified forms of tremor: Secondary | ICD-10-CM | POA: Diagnosis not present

## 2015-08-16 DIAGNOSIS — G252 Other specified forms of tremor: Secondary | ICD-10-CM | POA: Diagnosis not present

## 2015-08-17 DIAGNOSIS — G252 Other specified forms of tremor: Secondary | ICD-10-CM | POA: Diagnosis not present

## 2015-08-17 NOTE — Progress Notes (Addendum)
Patient ID: [name], female   DOB: [birth year], 64 y.o.   MRN: [id number]                PROGRESS NOTE  DATE:  08/13/2015        FACILITY: [facility]               LEVEL OF CARE:   SNF   Acute Visit               CHIEF COMPLAINT:  Swelling and pain in her right hand.    HISTORY OF PRESENT ILLNESS:  Over the last 24-48 hours, this patient has developed fairly intense erythema involving the right hand.  There is no history of trauma.  She has not been systemically unwell.  She does not carry a history of gout or pseudogout that I am aware of.    PAST MEDICAL HISTORY/PROBLEM LIST:            Essential tremors.    Seizure disorder.     Normochromic, normocytic anemia.    Depression.    Severe bilateral venous stasis and lymphedema.    Hypothyroidism.    Hypertension.    Hypernatremia.    Type 2 diabetes.    Chronic pain.    Chronic CHF.    CURRENT MEDICATIONS:  Medication list is reviewed.            Synthroid 150 q.d.     Depakote 500 b.i.d.      Baclofen 10 mg b.i.d.      Primidone 50 mg b.i.d. for essential tremors.      Lasix 20 q.d.      Lopressor 25 b.i.d.     REVIEW OF SYSTEMS:    GENERAL:  The patient denies fever or chills.   CHEST/RESPIRATORY:  No cough.  No sputum.    CARDIAC:  No chest pain.   GI:  No abdominal pain.   No change in bowel habits.   GU:  Incontinence is noted.  This is not new.  No dysuria.  No suprapubic pain.     MUSCULOSKELETAL:  She complains of some pain in the right hand.      PHYSICAL EXAMINATION:   VITAL SIGNS:     TEMPERATURE:  98.6.   PULSE:  68.    RESPIRATIONS:  20.     BLOOD PRESSURE:  130/68.   02 SATURATIONS:  96%.   GENERAL APPEARANCE:  The patient is not in any distress.  She does not look systemically unwell.        CHEST/RESPIRATORY:  Clear air entry bilaterally.    CARDIOVASCULAR:   CARDIAC:  Heart sounds are normal.  There are no murmurs.    GASTROINTESTINAL:   ABDOMEN:  Obese, but no masses  are noted.   MUSCULOSKELETAL:   EXTREMITIES:   RIGHT UPPER EXTREMITY:  Right hand:  She has intense swelling and erythema involving the first right PIP joint.  There is erythema of the back of the hand.  There is no acute arthritis involving the metacarpophalangeals or the right wrist that I can tell.   CIRCULATION:   ARTERIAL:  Radial pulse is palpable.  This is on both sides.   Lymph: none palpable at the epitrochlear, axilla,clavicular  ASSESSMENT/PLAN:                  I suspect this is an infection rather than an acute arthritis, although I think both are possible.  I am going to  start her on antibiotics currently.  It is possible for acute crystal arthropathy to look something like this; and if this does not respond, she may need treatment for this, as well.  She is allergic to penicillin.  I am going to start her on a quinolone and follow this clinically.    Hypothyroidism.  On replacement.  I think she may have refused lab work that I ordered last month to follow up on her TSH.

## 2015-08-18 DIAGNOSIS — G252 Other specified forms of tremor: Secondary | ICD-10-CM | POA: Diagnosis not present

## 2015-08-19 DIAGNOSIS — G252 Other specified forms of tremor: Secondary | ICD-10-CM | POA: Diagnosis not present

## 2015-08-22 DIAGNOSIS — G252 Other specified forms of tremor: Secondary | ICD-10-CM | POA: Diagnosis not present

## 2015-08-23 DIAGNOSIS — G252 Other specified forms of tremor: Secondary | ICD-10-CM | POA: Diagnosis not present

## 2015-08-24 DIAGNOSIS — G252 Other specified forms of tremor: Secondary | ICD-10-CM | POA: Diagnosis not present

## 2015-08-25 DIAGNOSIS — G252 Other specified forms of tremor: Secondary | ICD-10-CM | POA: Diagnosis not present

## 2015-08-26 DIAGNOSIS — G252 Other specified forms of tremor: Secondary | ICD-10-CM | POA: Diagnosis not present

## 2015-08-29 DIAGNOSIS — G252 Other specified forms of tremor: Secondary | ICD-10-CM | POA: Diagnosis not present

## 2015-08-30 DIAGNOSIS — G252 Other specified forms of tremor: Secondary | ICD-10-CM | POA: Diagnosis not present

## 2015-08-31 DIAGNOSIS — G252 Other specified forms of tremor: Secondary | ICD-10-CM | POA: Diagnosis not present

## 2015-09-01 DIAGNOSIS — G252 Other specified forms of tremor: Secondary | ICD-10-CM | POA: Diagnosis not present

## 2015-09-02 DIAGNOSIS — G252 Other specified forms of tremor: Secondary | ICD-10-CM | POA: Diagnosis not present

## 2015-09-05 DIAGNOSIS — G252 Other specified forms of tremor: Secondary | ICD-10-CM | POA: Diagnosis not present

## 2015-09-06 DIAGNOSIS — G252 Other specified forms of tremor: Secondary | ICD-10-CM | POA: Diagnosis not present

## 2015-09-07 DIAGNOSIS — G252 Other specified forms of tremor: Secondary | ICD-10-CM | POA: Diagnosis not present

## 2015-09-08 DIAGNOSIS — G252 Other specified forms of tremor: Secondary | ICD-10-CM | POA: Diagnosis not present

## 2015-09-09 DIAGNOSIS — G252 Other specified forms of tremor: Secondary | ICD-10-CM | POA: Diagnosis not present

## 2015-09-12 DIAGNOSIS — G252 Other specified forms of tremor: Secondary | ICD-10-CM | POA: Diagnosis not present

## 2015-09-13 DIAGNOSIS — G252 Other specified forms of tremor: Secondary | ICD-10-CM | POA: Diagnosis not present

## 2015-09-14 DIAGNOSIS — G252 Other specified forms of tremor: Secondary | ICD-10-CM | POA: Diagnosis not present

## 2015-09-15 DIAGNOSIS — G252 Other specified forms of tremor: Secondary | ICD-10-CM | POA: Diagnosis not present

## 2015-09-16 DIAGNOSIS — G252 Other specified forms of tremor: Secondary | ICD-10-CM | POA: Diagnosis not present

## 2015-09-19 DIAGNOSIS — M6281 Muscle weakness (generalized): Secondary | ICD-10-CM | POA: Diagnosis not present

## 2015-09-19 DIAGNOSIS — G252 Other specified forms of tremor: Secondary | ICD-10-CM | POA: Diagnosis not present

## 2015-09-19 DIAGNOSIS — R1312 Dysphagia, oropharyngeal phase: Secondary | ICD-10-CM | POA: Diagnosis not present

## 2015-09-20 ENCOUNTER — Non-Acute Institutional Stay (SKILLED_NURSING_FACILITY): Payer: Medicare Other | Admitting: Internal Medicine

## 2015-09-20 DIAGNOSIS — G40909 Epilepsy, unspecified, not intractable, without status epilepticus: Secondary | ICD-10-CM | POA: Diagnosis not present

## 2015-09-20 DIAGNOSIS — G252 Other specified forms of tremor: Secondary | ICD-10-CM | POA: Diagnosis not present

## 2015-09-20 DIAGNOSIS — M6281 Muscle weakness (generalized): Secondary | ICD-10-CM | POA: Diagnosis not present

## 2015-09-20 DIAGNOSIS — I10 Essential (primary) hypertension: Secondary | ICD-10-CM

## 2015-09-20 DIAGNOSIS — M79605 Pain in left leg: Secondary | ICD-10-CM | POA: Diagnosis not present

## 2015-09-20 DIAGNOSIS — E039 Hypothyroidism, unspecified: Secondary | ICD-10-CM

## 2015-09-20 DIAGNOSIS — R1312 Dysphagia, oropharyngeal phase: Secondary | ICD-10-CM | POA: Diagnosis not present

## 2015-09-20 NOTE — Progress Notes (Signed)
Patient ID: [name], female   DOB: May 20, 1951, 64 y.o.   MRN: [id number]                 PROGRESS NOTE  DATE:  09/20/2015/2016             FACILITY: [facility]                                 LEVEL OF CARE:   SNF   Routine Visit                       CHIEF COMPLAINT:  Review of general medical issues.    HISTORY OF PRESENT ILLNESS:  [name] is a patient who has been in the building probably roughly six years.  She came here from a nearby assisted living secondary to chronic venous stasis, chronic lymphedema, venous stasis ulcerations, and chronic immobility.  She never did respond to rehabilitation.  She is largely bedbound and immobile, largely of her own volition.    She is a type 2 diabetic, not on any current treatment  She has a longstanding history of seizures, on valproic acid.     She has worsening essential tremors.   I seen her 2 months ago with what sounded like benign positional vertigo this as seems to have resolved and she has not had another issue.  Today her only complaint is pain in the left hip/anterior thigh. She says this is excruciating when she moves.   PAST MEDICAL HISTORY/PROBLEM LIST:                 Essential tremors.    Seizure disorder.    Normocytic anemia.    Depression.    Lower extremity venous stasis with a history of ulcerations.    Lymphedema.    Hypothyroidism.     Hypertension.    Hypernatremia.    Type 2 diabetes.    Chronic pain.    Chronic diastolic heart failure.     CURRENT MEDICATIONS:  Medication list is reviewed.                Multivitamin daily.    Synthroid 150 daily.     Depakote 500 b.i.d.      Baclofen 10 mg b.i.d.      Biofreeze to left lateral upper thigh b.i.d.     Primidone 50 mg twice daily (I believe this is for essential tremors).    Lasix 20 mg daily.    Lopressor 25 mg daily.    Ultracet 37.5/325, 2 tablets q.6 p.r.n. for pain.               LABORATORY DATA:   Last lab work  was in February, at which time:    TSH 0.793.    Hemoglobin A1c 6.    Comprehensive metabolic panel:  Sodium 137, potassium 5.2, BUN 27, creatinine 0.46.    Albumin 3.2.    Liver function tests:  Normal.    CBC:  White count 7.8, hemoglobin 13.2, platelet count 344.    Lab work was ordered in July however the patient apparently refused the lab draw  REVIEW OF SYSTEMS:    Gen. no fever no chills HEENT no visual disturbances CHEST/RESPIRATORY:  No cough.  No shortness of breath.   CARDIAC:  No chest pain.   GI:  No nausea, vomiting, or diarrhea.  SKIN:  Extremities:  No open wounds.   Musculoskeletal campaigning of left anterior thigh pain.    PHYSICAL EXAMINATION:   VITAL SIGNS:     TEMPERATURE:  98   PULSE:  79  RESPIRATIONS:  20.     BLOOD PRESSURE:  140/68   WEIGHT:  156 pounds.   CHEST/RESPIRATORY:  Clear air entry bilaterally.    CARDIOVASCULAR:   CARDIAC:  Heart sounds are normal.  There are no murmurs.    GASTROINTESTINAL:   ABDOMEN:  No masses.     LIVER/SPLEEN/KIDNEYS:  No liver, no spleen.  No tenderness.    CIRCULATION:   EDEMA/VARICOSITIES:  Extremities:  Severe lower extremity venous stasis with a history of ulcerations.  With her bedbound status, her edema has largely gotten better and there have been no open wounds that I am aware of.   Musculoskeletal; there is palpable tenderness over her anterior left femur especially up towards the hip. I'm going to do an x-ray of this area.  ASSESSMENT/PLAN:                    Type 2 diabetes.  Not on any current medications.  She has not had any recent CBGs.  Her hemoglobin A1c in February was 6.  She refused this in July  Seizure disorder.  On valproic acid.  No recent seizures.  Valproic acid levels  Severe venous stasis with a history of venous stasis ulcerations.  No recent problems here.    Hypothyroidism.  On replacement.  She will need her TSH checked next month.    Essential tremors.  She is on  Primidone, I think for this, 50 mg twice a day.  This does not seem disabling.    Hypertension.  She is on a small dose of Lopressor.  Her blood pressure appears to be fairly well controlled, generally in the 140 range.         Left anterior thigh pain. I am not certain about the etiology of this I think it is at least deserves an x-ray as there is palpable tenderness.    I will reattempt her lab work although she is not very compliant. She is certainly not a candidate for any form of primary prevention

## 2015-09-21 DIAGNOSIS — G252 Other specified forms of tremor: Secondary | ICD-10-CM | POA: Diagnosis not present

## 2015-09-21 DIAGNOSIS — M6281 Muscle weakness (generalized): Secondary | ICD-10-CM | POA: Diagnosis not present

## 2015-09-21 DIAGNOSIS — R1312 Dysphagia, oropharyngeal phase: Secondary | ICD-10-CM | POA: Diagnosis not present

## 2015-09-22 DIAGNOSIS — G252 Other specified forms of tremor: Secondary | ICD-10-CM | POA: Diagnosis not present

## 2015-09-22 DIAGNOSIS — M6281 Muscle weakness (generalized): Secondary | ICD-10-CM | POA: Diagnosis not present

## 2015-09-22 DIAGNOSIS — R1312 Dysphagia, oropharyngeal phase: Secondary | ICD-10-CM | POA: Diagnosis not present

## 2015-09-23 DIAGNOSIS — M6281 Muscle weakness (generalized): Secondary | ICD-10-CM | POA: Diagnosis not present

## 2015-09-23 DIAGNOSIS — R1312 Dysphagia, oropharyngeal phase: Secondary | ICD-10-CM | POA: Diagnosis not present

## 2015-09-23 DIAGNOSIS — G252 Other specified forms of tremor: Secondary | ICD-10-CM | POA: Diagnosis not present

## 2015-09-26 DIAGNOSIS — G252 Other specified forms of tremor: Secondary | ICD-10-CM | POA: Diagnosis not present

## 2015-09-26 DIAGNOSIS — M6281 Muscle weakness (generalized): Secondary | ICD-10-CM | POA: Diagnosis not present

## 2015-09-26 DIAGNOSIS — R1312 Dysphagia, oropharyngeal phase: Secondary | ICD-10-CM | POA: Diagnosis not present

## 2015-09-27 DIAGNOSIS — M6281 Muscle weakness (generalized): Secondary | ICD-10-CM | POA: Diagnosis not present

## 2015-09-27 DIAGNOSIS — R1312 Dysphagia, oropharyngeal phase: Secondary | ICD-10-CM | POA: Diagnosis not present

## 2015-09-27 DIAGNOSIS — G252 Other specified forms of tremor: Secondary | ICD-10-CM | POA: Diagnosis not present

## 2015-09-28 DIAGNOSIS — M6281 Muscle weakness (generalized): Secondary | ICD-10-CM | POA: Diagnosis not present

## 2015-09-28 DIAGNOSIS — R1312 Dysphagia, oropharyngeal phase: Secondary | ICD-10-CM | POA: Diagnosis not present

## 2015-09-28 DIAGNOSIS — G252 Other specified forms of tremor: Secondary | ICD-10-CM | POA: Diagnosis not present

## 2015-09-29 DIAGNOSIS — G252 Other specified forms of tremor: Secondary | ICD-10-CM | POA: Diagnosis not present

## 2015-09-29 DIAGNOSIS — M6281 Muscle weakness (generalized): Secondary | ICD-10-CM | POA: Diagnosis not present

## 2015-09-29 DIAGNOSIS — R1312 Dysphagia, oropharyngeal phase: Secondary | ICD-10-CM | POA: Diagnosis not present

## 2015-09-30 DIAGNOSIS — G252 Other specified forms of tremor: Secondary | ICD-10-CM | POA: Diagnosis not present

## 2015-09-30 DIAGNOSIS — M25552 Pain in left hip: Secondary | ICD-10-CM | POA: Diagnosis not present

## 2015-09-30 DIAGNOSIS — R1312 Dysphagia, oropharyngeal phase: Secondary | ICD-10-CM | POA: Diagnosis not present

## 2015-09-30 DIAGNOSIS — M6281 Muscle weakness (generalized): Secondary | ICD-10-CM | POA: Diagnosis not present

## 2015-10-03 DIAGNOSIS — R1312 Dysphagia, oropharyngeal phase: Secondary | ICD-10-CM | POA: Diagnosis not present

## 2015-10-03 DIAGNOSIS — G252 Other specified forms of tremor: Secondary | ICD-10-CM | POA: Diagnosis not present

## 2015-10-03 DIAGNOSIS — M6281 Muscle weakness (generalized): Secondary | ICD-10-CM | POA: Diagnosis not present

## 2015-10-04 DIAGNOSIS — M6281 Muscle weakness (generalized): Secondary | ICD-10-CM | POA: Diagnosis not present

## 2015-10-04 DIAGNOSIS — G252 Other specified forms of tremor: Secondary | ICD-10-CM | POA: Diagnosis not present

## 2015-10-04 DIAGNOSIS — R1312 Dysphagia, oropharyngeal phase: Secondary | ICD-10-CM | POA: Diagnosis not present

## 2015-10-05 DIAGNOSIS — R1312 Dysphagia, oropharyngeal phase: Secondary | ICD-10-CM | POA: Diagnosis not present

## 2015-10-05 DIAGNOSIS — M6281 Muscle weakness (generalized): Secondary | ICD-10-CM | POA: Diagnosis not present

## 2015-10-05 DIAGNOSIS — G252 Other specified forms of tremor: Secondary | ICD-10-CM | POA: Diagnosis not present

## 2015-10-06 DIAGNOSIS — M6281 Muscle weakness (generalized): Secondary | ICD-10-CM | POA: Diagnosis not present

## 2015-10-06 DIAGNOSIS — R1312 Dysphagia, oropharyngeal phase: Secondary | ICD-10-CM | POA: Diagnosis not present

## 2015-10-06 DIAGNOSIS — G252 Other specified forms of tremor: Secondary | ICD-10-CM | POA: Diagnosis not present

## 2015-10-07 DIAGNOSIS — G252 Other specified forms of tremor: Secondary | ICD-10-CM | POA: Diagnosis not present

## 2015-10-07 DIAGNOSIS — M6281 Muscle weakness (generalized): Secondary | ICD-10-CM | POA: Diagnosis not present

## 2015-10-07 DIAGNOSIS — R1312 Dysphagia, oropharyngeal phase: Secondary | ICD-10-CM | POA: Diagnosis not present

## 2015-10-09 DIAGNOSIS — G252 Other specified forms of tremor: Secondary | ICD-10-CM | POA: Diagnosis not present

## 2015-10-09 DIAGNOSIS — R1312 Dysphagia, oropharyngeal phase: Secondary | ICD-10-CM | POA: Diagnosis not present

## 2015-10-09 DIAGNOSIS — M6281 Muscle weakness (generalized): Secondary | ICD-10-CM | POA: Diagnosis not present

## 2015-10-10 DIAGNOSIS — G252 Other specified forms of tremor: Secondary | ICD-10-CM | POA: Diagnosis not present

## 2015-10-10 DIAGNOSIS — M6281 Muscle weakness (generalized): Secondary | ICD-10-CM | POA: Diagnosis not present

## 2015-10-10 DIAGNOSIS — R1312 Dysphagia, oropharyngeal phase: Secondary | ICD-10-CM | POA: Diagnosis not present

## 2015-10-11 DIAGNOSIS — R1312 Dysphagia, oropharyngeal phase: Secondary | ICD-10-CM | POA: Diagnosis not present

## 2015-10-11 DIAGNOSIS — G252 Other specified forms of tremor: Secondary | ICD-10-CM | POA: Diagnosis not present

## 2015-10-11 DIAGNOSIS — M6281 Muscle weakness (generalized): Secondary | ICD-10-CM | POA: Diagnosis not present

## 2015-11-08 ENCOUNTER — Non-Acute Institutional Stay (SKILLED_NURSING_FACILITY): Payer: Medicare Other | Admitting: Internal Medicine

## 2015-11-08 DIAGNOSIS — E1100 Type 2 diabetes mellitus with hyperosmolarity without nonketotic hyperglycemic-hyperosmolar coma (NKHHC): Secondary | ICD-10-CM

## 2015-11-08 DIAGNOSIS — E039 Hypothyroidism, unspecified: Secondary | ICD-10-CM | POA: Diagnosis not present

## 2015-11-08 DIAGNOSIS — G40909 Epilepsy, unspecified, not intractable, without status epilepticus: Secondary | ICD-10-CM | POA: Diagnosis not present

## 2015-11-08 DIAGNOSIS — H8111 Benign paroxysmal vertigo, right ear: Secondary | ICD-10-CM

## 2015-11-08 DIAGNOSIS — I10 Essential (primary) hypertension: Secondary | ICD-10-CM

## 2015-11-08 DIAGNOSIS — M6281 Muscle weakness (generalized): Secondary | ICD-10-CM | POA: Diagnosis not present

## 2015-11-09 DIAGNOSIS — M6281 Muscle weakness (generalized): Secondary | ICD-10-CM | POA: Diagnosis not present

## 2015-11-09 DIAGNOSIS — E039 Hypothyroidism, unspecified: Secondary | ICD-10-CM | POA: Diagnosis not present

## 2015-11-09 DIAGNOSIS — D649 Anemia, unspecified: Secondary | ICD-10-CM | POA: Diagnosis not present

## 2015-11-09 DIAGNOSIS — F329 Major depressive disorder, single episode, unspecified: Secondary | ICD-10-CM | POA: Diagnosis not present

## 2015-11-09 DIAGNOSIS — I10 Essential (primary) hypertension: Secondary | ICD-10-CM | POA: Diagnosis not present

## 2015-11-09 LAB — BASIC METABOLIC PANEL
BUN: 16 mg/dL (ref 4–21)
CREATININE: 0.4 mg/dL — AB (ref 0.5–1.1)
GLUCOSE: 84 mg/dL
POTASSIUM: 5.6 mmol/L — AB (ref 3.4–5.3)
SODIUM: 136 mmol/L — AB (ref 137–147)

## 2015-11-09 LAB — CBC AND DIFFERENTIAL
HCT: 36 % (ref 36–46)
HEMOGLOBIN: 11.6 g/dL — AB (ref 12.0–16.0)
Platelets: 314 10*3/uL (ref 150–399)
WBC: 9.9 10*3/mL

## 2015-11-09 LAB — TSH: TSH: 0.38 u[IU]/mL — AB (ref 0.41–5.90)

## 2015-11-09 LAB — HEPATIC FUNCTION PANEL
ALT: 5 U/L — AB (ref 7–35)
AST: 15 U/L (ref 13–35)

## 2015-11-10 DIAGNOSIS — M6281 Muscle weakness (generalized): Secondary | ICD-10-CM | POA: Diagnosis not present

## 2015-11-10 DIAGNOSIS — E039 Hypothyroidism, unspecified: Secondary | ICD-10-CM | POA: Diagnosis not present

## 2015-11-11 DIAGNOSIS — E039 Hypothyroidism, unspecified: Secondary | ICD-10-CM | POA: Diagnosis not present

## 2015-11-11 DIAGNOSIS — M6281 Muscle weakness (generalized): Secondary | ICD-10-CM | POA: Diagnosis not present

## 2015-11-14 DIAGNOSIS — E039 Hypothyroidism, unspecified: Secondary | ICD-10-CM | POA: Diagnosis not present

## 2015-11-14 DIAGNOSIS — M6281 Muscle weakness (generalized): Secondary | ICD-10-CM | POA: Diagnosis not present

## 2015-11-15 DIAGNOSIS — E039 Hypothyroidism, unspecified: Secondary | ICD-10-CM | POA: Diagnosis not present

## 2015-11-15 DIAGNOSIS — M6281 Muscle weakness (generalized): Secondary | ICD-10-CM | POA: Diagnosis not present

## 2015-11-16 DIAGNOSIS — M6281 Muscle weakness (generalized): Secondary | ICD-10-CM | POA: Diagnosis not present

## 2015-11-16 DIAGNOSIS — E039 Hypothyroidism, unspecified: Secondary | ICD-10-CM | POA: Diagnosis not present

## 2015-11-17 DIAGNOSIS — E039 Hypothyroidism, unspecified: Secondary | ICD-10-CM | POA: Diagnosis not present

## 2015-11-17 DIAGNOSIS — M6281 Muscle weakness (generalized): Secondary | ICD-10-CM | POA: Diagnosis not present

## 2015-11-17 DIAGNOSIS — M25552 Pain in left hip: Secondary | ICD-10-CM | POA: Diagnosis not present

## 2015-11-17 NOTE — Progress Notes (Addendum)
Patient ID: [name], female   DOB: July 30, 1951, 64 y.o.   MRN: [id number]                PROGRESS NOTE  DATE:  11/08/2015            FACILITY: [facility]                     LEVEL OF CARE:   SNF   Acute Visit/Routine Visit   CHIEF COMPLAINT:  Review of general medical issues, review of laboratory refusals.    HISTORY OF PRESENT ILLNESS:  [name] is a lady who has been in the building for many years.  She came from a nearby assisted living secondary to severe bilateral venous stasis, chronic lymphedema, and venous stasis ulceration.  She has largely confined herself to bed.  This is largely of her own volition.    She is also a type 2 diabetic, but not currently on any treatment.    She is on Synthroid.  However, I have not been able to get a TSH in many months now secondary to patient refusals of lab work.    She states that she finds it annoying when the phlebotomist comes so early in the morning.  I do not really have a clear sense of when the phlebotomist comes.    PAST MEDICAL HISTORY/PROBLEM LIST:      Essential tremors.    Seizure disorder (I am not aware of any recent seizures).    Normocytic anemia.    Depression with anxiety.     Lower extremity venous stasis with a history of ulcerations and lymphedema.    Hypothyroidism (see discussion above).    Hypertension.    Hypernatremia.    Type 2 diabetes.  Not on current treatment.    Chronic pain.    Chronic diastolic heart failure.      CURRENT MEDICATIONS:  Medication list is reviewed.           Multivitamin daily.    Synthroid 150 daily.    Biofreeze 4% to right shoulder.    Biofreeze to left lateral and upper thigh.    Depakote 500 twice daily for seizure disorder.    Baclofen 10 mg b.i.d.      Primidone 50 mg p.o. b.i.d. for essential tremor.      Lasix 20 q.d.      Lopressor 25 daily at 8 p.m.      LABORATORY DATA:   Her last lab work was in February:      TSH 0.793.     Hemoglobin A1c 6.    Comprehensive metabolic panel:    Sodium 137, potassium 5.2, BUN 27, creatinine 0.46.    Albumin 3.2.     CBC:  Normal.      Liver function tests:  Normal.    *She has refused the last two blood draws in July and October.     REVIEW OF SYSTEMS:    GENERAL:  No fever.  No chills.     HEENT:   No visual disturbances.   CHEST/RESPIRATORY:  No cough.  No sputum.    CARDIAC:  No chest pain.   GI:  No nausea, vomiting, or diarrhea.          SKIN:  Apparently no open wounds in her lower extremities.    MUSCULOSKELETAL:  She has left anterior thigh pain for which she uses Biofreeze.  She states that has made a big difference.  I did x-ray the left hip in October which did not show any acute abnormality, previous left hip replacement.   CNS: she has been having episodic vertiginous spells which have been seemingly secondary to BPV  PHYSICAL EXAMINATION:   VITAL SIGNS:     TEMPERATURE:  98.1.     PULSE:  60.    RESPIRATIONS:  18.   BLOOD PRESSURE:  103/55.   02 SATURATIONS:  96% on room air.   CHEST/RESPIRATORY:  Clear air entry bilaterally.    CARDIOVASCULAR:   CARDIAC:  Heart sounds are normal.  There are no murmurs.   She appears to be euvolemic.   GASTROINTESTINAL:   LIVER/SPLEEN/KIDNEYS:  No liver, no spleen.  No tenderness.    GENITOURINARY:   BLADDER:  No suprapubic or costovertebral angle tenderness.    MUSCULOSKELETAL:   EXTREMITIES:   BILATERAL LOWER EXTREMITIES:  Her legs were wrapped.  I did not remove these.    ASSESSMENT/PLAN:                   Hypothyroidism.  I will reattempt her TSH level.     Type 2 diabetes.  Not on any current treatment.  I would like to recheck her hemoglobin A1c.    Seizure disorder.  On valproic acid.    Essential tremors.  This does not seem disabling.    Hypertension.  On a small dose of Lopressor.  Everything seems stable here, as well.    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 She has had at least  2 episdoes of this that I am aware of. Has prn Meclizine for now  I am going to reattempt her lab work.

## 2015-11-18 DIAGNOSIS — E039 Hypothyroidism, unspecified: Secondary | ICD-10-CM | POA: Diagnosis not present

## 2015-11-18 DIAGNOSIS — M25552 Pain in left hip: Secondary | ICD-10-CM | POA: Diagnosis not present

## 2015-11-18 DIAGNOSIS — M6281 Muscle weakness (generalized): Secondary | ICD-10-CM | POA: Diagnosis not present

## 2015-11-21 DIAGNOSIS — E039 Hypothyroidism, unspecified: Secondary | ICD-10-CM | POA: Diagnosis not present

## 2015-11-21 DIAGNOSIS — M6281 Muscle weakness (generalized): Secondary | ICD-10-CM | POA: Diagnosis not present

## 2015-11-21 DIAGNOSIS — M25552 Pain in left hip: Secondary | ICD-10-CM | POA: Diagnosis not present

## 2015-11-22 DIAGNOSIS — M6281 Muscle weakness (generalized): Secondary | ICD-10-CM | POA: Diagnosis not present

## 2015-11-22 DIAGNOSIS — M25552 Pain in left hip: Secondary | ICD-10-CM | POA: Diagnosis not present

## 2015-11-22 DIAGNOSIS — E039 Hypothyroidism, unspecified: Secondary | ICD-10-CM | POA: Diagnosis not present

## 2015-11-23 DIAGNOSIS — E039 Hypothyroidism, unspecified: Secondary | ICD-10-CM | POA: Diagnosis not present

## 2015-11-23 DIAGNOSIS — M6281 Muscle weakness (generalized): Secondary | ICD-10-CM | POA: Diagnosis not present

## 2015-11-23 DIAGNOSIS — M25552 Pain in left hip: Secondary | ICD-10-CM | POA: Diagnosis not present

## 2015-11-24 DIAGNOSIS — M25552 Pain in left hip: Secondary | ICD-10-CM | POA: Diagnosis not present

## 2015-11-24 DIAGNOSIS — E039 Hypothyroidism, unspecified: Secondary | ICD-10-CM | POA: Diagnosis not present

## 2015-11-24 DIAGNOSIS — M6281 Muscle weakness (generalized): Secondary | ICD-10-CM | POA: Diagnosis not present

## 2015-11-25 DIAGNOSIS — M25552 Pain in left hip: Secondary | ICD-10-CM | POA: Diagnosis not present

## 2015-11-25 DIAGNOSIS — E039 Hypothyroidism, unspecified: Secondary | ICD-10-CM | POA: Diagnosis not present

## 2015-11-25 DIAGNOSIS — M6281 Muscle weakness (generalized): Secondary | ICD-10-CM | POA: Diagnosis not present

## 2015-11-28 DIAGNOSIS — M25552 Pain in left hip: Secondary | ICD-10-CM | POA: Diagnosis not present

## 2015-11-28 DIAGNOSIS — M6281 Muscle weakness (generalized): Secondary | ICD-10-CM | POA: Diagnosis not present

## 2015-11-28 DIAGNOSIS — E039 Hypothyroidism, unspecified: Secondary | ICD-10-CM | POA: Diagnosis not present

## 2015-11-29 DIAGNOSIS — M25552 Pain in left hip: Secondary | ICD-10-CM | POA: Diagnosis not present

## 2015-11-29 DIAGNOSIS — M6281 Muscle weakness (generalized): Secondary | ICD-10-CM | POA: Diagnosis not present

## 2015-11-29 DIAGNOSIS — E039 Hypothyroidism, unspecified: Secondary | ICD-10-CM | POA: Diagnosis not present

## 2015-11-30 DIAGNOSIS — M6281 Muscle weakness (generalized): Secondary | ICD-10-CM | POA: Diagnosis not present

## 2015-11-30 DIAGNOSIS — E039 Hypothyroidism, unspecified: Secondary | ICD-10-CM | POA: Diagnosis not present

## 2015-11-30 DIAGNOSIS — M25552 Pain in left hip: Secondary | ICD-10-CM | POA: Diagnosis not present

## 2015-12-01 DIAGNOSIS — E039 Hypothyroidism, unspecified: Secondary | ICD-10-CM | POA: Diagnosis not present

## 2015-12-01 DIAGNOSIS — M25552 Pain in left hip: Secondary | ICD-10-CM | POA: Diagnosis not present

## 2015-12-01 DIAGNOSIS — M6281 Muscle weakness (generalized): Secondary | ICD-10-CM | POA: Diagnosis not present

## 2015-12-02 DIAGNOSIS — M25552 Pain in left hip: Secondary | ICD-10-CM | POA: Diagnosis not present

## 2015-12-02 DIAGNOSIS — M6281 Muscle weakness (generalized): Secondary | ICD-10-CM | POA: Diagnosis not present

## 2015-12-02 DIAGNOSIS — E039 Hypothyroidism, unspecified: Secondary | ICD-10-CM | POA: Diagnosis not present

## 2015-12-05 DIAGNOSIS — E039 Hypothyroidism, unspecified: Secondary | ICD-10-CM | POA: Diagnosis not present

## 2015-12-05 DIAGNOSIS — M25552 Pain in left hip: Secondary | ICD-10-CM | POA: Diagnosis not present

## 2015-12-05 DIAGNOSIS — M6281 Muscle weakness (generalized): Secondary | ICD-10-CM | POA: Diagnosis not present

## 2015-12-06 DIAGNOSIS — M25552 Pain in left hip: Secondary | ICD-10-CM | POA: Diagnosis not present

## 2015-12-06 DIAGNOSIS — M6281 Muscle weakness (generalized): Secondary | ICD-10-CM | POA: Diagnosis not present

## 2015-12-06 DIAGNOSIS — E039 Hypothyroidism, unspecified: Secondary | ICD-10-CM | POA: Diagnosis not present

## 2015-12-07 DIAGNOSIS — M6281 Muscle weakness (generalized): Secondary | ICD-10-CM | POA: Diagnosis not present

## 2015-12-07 DIAGNOSIS — M25552 Pain in left hip: Secondary | ICD-10-CM | POA: Diagnosis not present

## 2015-12-07 DIAGNOSIS — E039 Hypothyroidism, unspecified: Secondary | ICD-10-CM | POA: Diagnosis not present

## 2016-02-09 ENCOUNTER — Other Ambulatory Visit: Payer: Self-pay

## 2016-02-09 MED ORDER — TRAMADOL HCL 50 MG PO TABS: ORAL_TABLET | ORAL | Status: DC

## 2016-02-09 NOTE — Telephone Encounter (Signed)
Rx faxed to [facility] @ [phone], phone number [phone]  

## 2016-02-10 ENCOUNTER — Non-Acute Institutional Stay (SKILLED_NURSING_FACILITY): Payer: Medicare Other | Admitting: Nurse Practitioner

## 2016-02-10 ENCOUNTER — Encounter: Payer: Self-pay | Admitting: Nurse Practitioner

## 2016-02-10 DIAGNOSIS — E1159 Type 2 diabetes mellitus with other circulatory complications: Secondary | ICD-10-CM | POA: Diagnosis not present

## 2016-02-10 DIAGNOSIS — E1165 Type 2 diabetes mellitus with hyperglycemia: Secondary | ICD-10-CM

## 2016-02-10 DIAGNOSIS — G40909 Epilepsy, unspecified, not intractable, without status epilepticus: Secondary | ICD-10-CM

## 2016-02-10 DIAGNOSIS — IMO0002 Reserved for concepts with insufficient information to code with codable children: Secondary | ICD-10-CM

## 2016-02-10 DIAGNOSIS — E039 Hypothyroidism, unspecified: Secondary | ICD-10-CM | POA: Diagnosis not present

## 2016-02-10 DIAGNOSIS — G250 Essential tremor: Secondary | ICD-10-CM | POA: Diagnosis not present

## 2016-02-10 DIAGNOSIS — I5032 Chronic diastolic (congestive) heart failure: Secondary | ICD-10-CM

## 2016-02-10 DIAGNOSIS — I10 Essential (primary) hypertension: Secondary | ICD-10-CM

## 2016-02-10 DIAGNOSIS — I878 Other specified disorders of veins: Secondary | ICD-10-CM

## 2016-02-10 NOTE — Progress Notes (Signed)
Patient ID: [name], female   DOB: 17-Oct-1951, 65 y.o.   MRN: [id number]  Location:  [facility] Room Number: 214 B Place of Service:  SNF (31)   Terald Sleeper, MD  Patient Care Team: Maxwell Caul, MD as PCP - General (Internal Medicine)  Extended Emergency Contact Information Primary Emergency Contact: Pickens,Jean Address: [address]          [place] [phone] [place] Home Phone: [phone] Relation: Sister Secondary Emergency Contact: Milas Kocher [place] Home Phone: [phone] Relation: Sister  Code Status:  DNR  Goals of care: Advanced Directive information Advanced Directives 02/10/2016  Does patient have an advance directive? Yes  Type of Advance Directive Out of facility DNR (pink MOST or yellow form)  Does patient want to make changes to advanced directive? No - Patient declined  Copy of advanced directive(s) in chart? Yes  Pre-existing out of facility DNR order (yellow form or pink MOST form) -     Chief Complaint  Patient presents with  . Medical Management of Chronic Issues    Routine Visit    HPI:  Pt is a 65 y.o. female seen today for medical management of chronic diseases. Pt with pmh of CHF, HTN, DM, Hypothyroidism, tremor, chronic venus ulcers. Pt stays in bed most of the time due to her own choice. Pt does not like the way staff moves her. Pt denies anxiety, depression or change in mood. Pt reports she has been doing well. Pt has no complaints at this time. Staff without acute concerns.    Past Medical History  Diagnosis Date  . Hypertension   . Thyroid disease     Hypothyroidism  . CHF (congestive heart failure) (HCC)     Status post 2-D echocardiogram 08/12/2010, EF 60-65%, grade 1 diastolic dysfunction  . Epilepsy (HCC)   . Venous insufficiency   . Morbid obesity (HCC)   . Venous stasis ulcers (HCC)   . Essential tremor    Past Surgical History  Procedure  Laterality Date  . Bilateral cataract surgery with lens implants    . Right total hip replacement    . Left hip hemiarthroplasty    . Right knee open reduction and internal fixation of patella fracture    . Abdominal hysterectomy      Allergies  Allergen Reactions  . Codeine   . Morphine And Related   . Naproxen   . Penicillins       Medication List       This list is accurate as of: 02/10/16  3:46 PM.  Always use your most recent med list.               baclofen 10 MG tablet  Commonly known as:  LIORESAL  Take 10 mg by mouth 2 (two) times daily. For tremors     BIOFREEZE 4 % Gel  Generic drug:  Menthol (Topical Analgesic)  Apply topically 2 (two) times daily.     divalproex 500 MG DR tablet  Commonly known as:  DEPAKOTE  Take 500 mg by mouth 2 (two) times daily. For seizure disorder     furosemide 20 MG tablet  Commonly known as:  LASIX  Take 20 mg by mouth daily. Takes in the evening     levothyroxine 150 MCG tablet  Commonly known as:  SYNTHROID, LEVOTHROID  Take 150 mcg by mouth daily before breakfast. For hypothyroidism     metoprolol  tartrate 25 MG tablet  Commonly known as:  LOPRESSOR  Take 25 mg by mouth daily.     primidone 50 MG tablet  Commonly known as:  MYSOLINE  Take 1 tablet (50 mg total) by mouth 2 (two) times daily.     traMADol 50 MG tablet  Commonly known as:  ULTRAM  1 by mouth every 4 hours as needed for mild pain, take 2 by mouth every 4 hours as needed for moderate pain        Review of Systems  Constitutional: Negative for fever, chills, activity change and appetite change.  HENT: Negative for tinnitus.   Respiratory: Negative for cough and shortness of breath.   Cardiovascular: Negative for chest pain, palpitations and leg swelling.  Gastrointestinal: Negative for abdominal pain, diarrhea and constipation.  Genitourinary: Negative for dysuria, urgency and frequency.  Musculoskeletal: Negative for myalgias and back pain.    Skin: Positive for wound (chronic venous statis ulcer).       LE skin changes   Neurological: Positive for tremors. Negative for dizziness and headaches.  Psychiatric/Behavioral: Negative for behavioral problems and confusion.    Immunization History  Administered Date(s) Administered  . Pneumococcal Polysaccharide-23 06/10/2013   Pertinent  Health Maintenance Due  Topic Date Due  . FOOT EXAM  08/10/1959  . OPHTHALMOLOGY EXAM  08/10/1959  . URINE MICROALBUMIN  08/10/1959  . MAMMOGRAM  08/10/1999  . COLONOSCOPY  08/10/1999  . DEXA SCAN  08/09/2014  . PNA vac Low Risk Adult (1 of 2 - PCV13) 08/09/2014  . HEMOGLOBIN A1C  01/06/2015  . INFLUENZA VACCINE  10/02/2017 (Originally 07/18/2015)   No flowsheet data found. Functional Status Survey:    Filed Vitals:   02/10/16 1521  BP: 128/76  Pulse: 70  Temp: 98.1 F (36.7 C)  TempSrc: Oral  Resp: 18   There is no weight on file to calculate BMI. Physical Exam  Constitutional: She is oriented to person, place, and time. No distress.  HENT:  Head: Normocephalic and atraumatic.  Mouth/Throat: Oropharynx is clear and moist. No oropharyngeal exudate.  Eyes: Conjunctivae are normal. Pupils are equal, round, and reactive to light.  Neck: Normal range of motion. Neck supple.  Cardiovascular: Normal rate, regular rhythm and normal heart sounds.   Pulmonary/Chest: Effort normal and breath sounds normal.  Abdominal: Soft. Bowel sounds are normal.  Musculoskeletal: She exhibits edema (bilateral LE). She exhibits no tenderness.  Neurological: She is alert and oriented to person, place, and time.  Skin: Skin is warm and dry. She is not diaphoretic.  Chronic venous statis ulcers to bilateral LE, dressing wrapped and intact   Psychiatric: She has a normal mood and affect.    Labs reviewed:  Recent Labs  11/09/15  NA 136*  K 5.6*  BUN 16  CREATININE 0.4*    Recent Labs  11/09/15  AST 15  ALT 5*    Recent Labs  11/09/15   WBC 9.9  HGB 11.6*  HCT 36  PLT 314   Lab Results  Component Value Date   TSH 0.38* 11/09/2015   Lab Results  Component Value Date   HGBA1C 6.0 01/20/2015   No results found for: CHOL, HDL, LDLCALC, LDLDIRECT, TRIG, CHOLHDL  Valproic Acid 11/09/15: 85.0  Significant Diagnostic Results in last 30 days:  No results found.  Assessment/Plan 1. Essential hypertension -blood pressure stable, conts on lopressor daily   2. Chronic diastolic CHF (congestive heart failure) (HCC) CHF remains stable, no recent exacerbation. conts on  lasix and lopressor   3. Uncontrolled type 2 diabetes mellitus with other circulatory complication, without long-term current use of insulin (HCC) -no recent A1c, off all medications, will follow up A1c at this time (if pt allows for blood draw)  4. Tremor, essential Well controlled on primidone 50 mg BID  5. Seizure disorder (HCC) No recent seizures, conts on depakote 500 mg BID  6. Lower extremity venous stasis -stable, conts to be followed by wound care   7. Hypothyroidism, unspecified hypothyroidism type TSH stable, to cont synthroid 150 mcg daily    Yilia Sacca K. Biagio Borg  [facility] [phone] 8 am - 5 pm) [phone] (after hours)

## 2016-02-13 DIAGNOSIS — I10 Essential (primary) hypertension: Secondary | ICD-10-CM | POA: Diagnosis not present

## 2016-02-13 DIAGNOSIS — E1165 Type 2 diabetes mellitus with hyperglycemia: Secondary | ICD-10-CM | POA: Diagnosis not present

## 2016-02-13 LAB — BASIC METABOLIC PANEL
BUN: 28 mg/dL — AB (ref 4–21)
CREATININE: 0.5 mg/dL (ref 0.5–1.1)
Glucose: 75 mg/dL
POTASSIUM: 6.3 mmol/L — AB (ref 3.4–5.3)
SODIUM: 139 mmol/L (ref 137–147)

## 2016-02-13 LAB — HEMOGLOBIN A1C: HEMOGLOBIN A1C: 5.6

## 2016-02-14 DIAGNOSIS — I130 Hypertensive heart and chronic kidney disease with heart failure and stage 1 through stage 4 chronic kidney disease, or unspecified chronic kidney disease: Secondary | ICD-10-CM | POA: Diagnosis not present

## 2016-03-14 ENCOUNTER — Non-Acute Institutional Stay (SKILLED_NURSING_FACILITY): Payer: Medicare Other | Admitting: Nurse Practitioner

## 2016-03-14 ENCOUNTER — Encounter: Payer: Self-pay | Admitting: Nurse Practitioner

## 2016-03-14 DIAGNOSIS — E875 Hyperkalemia: Secondary | ICD-10-CM | POA: Diagnosis not present

## 2016-03-14 DIAGNOSIS — D649 Anemia, unspecified: Secondary | ICD-10-CM

## 2016-03-14 DIAGNOSIS — I10 Essential (primary) hypertension: Secondary | ICD-10-CM | POA: Diagnosis not present

## 2016-03-14 DIAGNOSIS — E039 Hypothyroidism, unspecified: Secondary | ICD-10-CM | POA: Diagnosis not present

## 2016-03-14 DIAGNOSIS — E870 Hyperosmolality and hypernatremia: Secondary | ICD-10-CM

## 2016-03-14 DIAGNOSIS — I5032 Chronic diastolic (congestive) heart failure: Secondary | ICD-10-CM

## 2016-03-14 DIAGNOSIS — G250 Essential tremor: Secondary | ICD-10-CM | POA: Diagnosis not present

## 2016-03-14 DIAGNOSIS — E1100 Type 2 diabetes mellitus with hyperosmolarity without nonketotic hyperglycemic-hyperosmolar coma (NKHHC): Secondary | ICD-10-CM

## 2016-03-14 DIAGNOSIS — G8929 Other chronic pain: Secondary | ICD-10-CM | POA: Diagnosis not present

## 2016-03-14 NOTE — Assessment & Plan Note (Signed)
Clinically compensated, continue Furosemide 20mg daily.  

## 2016-03-14 NOTE — Assessment & Plan Note (Signed)
Last Hgb A1c 5.6 02/13/16

## 2016-03-14 NOTE — Assessment & Plan Note (Signed)
Seizure free since last visited, continue Depakote 500mg  bid.

## 2016-03-14 NOTE — Assessment & Plan Note (Signed)
Pain is managed with Baclofen 10mg  bid, Tylenol, Biofreeze

## 2016-03-14 NOTE — Assessment & Plan Note (Signed)
Taking Levothyroxine 150mcg, last TSH 0.38 11/09/15, update TSH

## 2016-03-14 NOTE — Assessment & Plan Note (Signed)
No apparent bleed, update CBC

## 2016-03-14 NOTE — Assessment & Plan Note (Signed)
02/13/16 Na 139

## 2016-03-14 NOTE — Assessment & Plan Note (Signed)
Blood pressure is controlled, continue Metoprolol 25mg  and Furosmide 20mg  daily.

## 2016-03-14 NOTE — Assessment & Plan Note (Signed)
Last serum K 6.3 02/13/16, update CMP

## 2016-03-14 NOTE — Progress Notes (Signed)
Patient ID: [name], female   DOB: 1951-07-22, 65 y.o.   MRN: [id number]  Location:  [facility] Room Number: 214 B Place of Service:  SNF (31) Provider: Arna Snipe Hasheem Voland NP  Terald Sleeper, MD  Patient Care Team: Maxwell Caul, MD as PCP - General (Internal Medicine)  Extended Emergency Contact Information Primary Emergency Contact: Pickens,Jean Address: [address]          [place] [phone] [place] Home Phone: [phone] Relation: Sister Secondary Emergency Contact: Milas Koc[place] Home Phone: [phone] Relation: Sister  Code Status:  DNR Goals of care: Advanced Directive information Advanced Directives 03/14/2016  Does patient have an advance directive? Yes  Type of Advance Directive Out of facility DNR (pink MOST or yellow form)  Does patient want to make changes to advanced directive? No - Patient declined  Copy of advanced directive(s) in chart? Yes     Chief Complaint  Patient presents with  . Medical Management of Chronic Issues    Routine Visit  . Medication Management    patient requesting Biofreeze 2 times daily instead of once for shoulder pain, Patient also requesting tylenol twice daily would like it to be scheduled     HPI:  Pt is a 65 y.o. female seen today for medical management of chronic diseases.  Hx of parkinson dx, managed with Primidone  bid, blood pressure is controlled on Metoprolol  daily and Furosemide  daily, muscle spasm and shoulder pain is managed with Baclofen and Tylenol bid and Biofreeze bid. Seizure free while on Depakote  bid. Taking Levothyroxine , last TSH 0.38 11/08/16.    Past Medical History  Diagnosis Date  . Hypertension   . Thyroid disease     Hypothyroidism  . CHF (congestive heart failure) (HCC)     Status post 2-D echocardiogram 08/12/2010, EF 60-65%, grade 1 diastolic dysfunction  . Epilepsy (HCC)   . Venous  insufficiency   . Morbid obesity (HCC)   . Venous stasis ulcers (HCC)   . Essential tremor    Past Surgical History  Procedure Laterality Date  . Bilateral cataract surgery with lens implants    . Right total hip replacement    . Left hip hemiarthroplasty    . Right knee open reduction and internal fixation of patella fracture    . Abdominal hysterectomy      Allergies  Allergen Reactions  . Codeine   . Morphine And Related   . Naproxen   . Penicillins       Medication List       This list is accurate as of: 03/14/16  4:42 PM.  Always use your most recent med list.               baclofen 10 MG tablet  Commonly known as:  LIORESAL  Take 10 mg by mouth 2 (two) times daily. For tremors     BIOFREEZE 4 % Gel  Generic drug:  Menthol (Topical Analgesic)  Apply topically daily.     divalproex 500 MG DR tablet  Commonly known as:  DEPAKOTE  Take 500 mg by mouth 2 (two) times daily. For seizure disorder     furosemide 20 MG tablet  Commonly known as:  LASIX  Take 20 mg by mouth daily. Takes in the evening     levothyroxine 150 MCG tablet  Commonly known as:  SYNTHROID, LEVOTHROID  Take 150 mcg by mouth daily before  breakfast. For hypothyroidism     metoprolol tartrate 25 MG tablet  Commonly known as:  LOPRESSOR  Take 25 mg by mouth daily.     primidone 50 MG tablet  Commonly known as:  MYSOLINE  Take 1 tablet (50 mg total) by mouth 2 (two) times daily.        Review of Systems  Constitutional: Negative for fever, chills, activity change and appetite change.  HENT: Negative for tinnitus.   Respiratory: Negative for cough and shortness of breath.   Cardiovascular: Negative for chest pain, palpitations and leg swelling.  Gastrointestinal: Negative for abdominal pain, diarrhea and constipation.  Genitourinary: Negative for dysuria, urgency and frequency.  Musculoskeletal: Negative for myalgias and back pain.  Skin: Positive for wound (chronic venous statis  ulcer).       LE skin changes   Neurological: Positive for tremors. Negative for dizziness and headaches.  Psychiatric/Behavioral: Negative for behavioral problems and confusion.    Immunization History  Administered Date(s) Administered  . Pneumococcal Polysaccharide-23 06/10/2013   Pertinent  Health Maintenance Due  Topic Date Due  . FOOT EXAM  08/10/1959  . OPHTHALMOLOGY EXAM  08/10/1959  . URINE MICROALBUMIN  08/10/1959  . MAMMOGRAM  08/10/1999  . COLONOSCOPY  08/10/1999  . DEXA SCAN  08/09/2014  . PNA vac Low Risk Adult (1 of 2 - PCV13) 08/09/2014  . HEMOGLOBIN A1C  07/21/2015  . INFLUENZA VACCINE  10/02/2017 (Originally 07/18/2015)   No flowsheet data found. Functional Status Survey:    Filed Vitals:   There is no weight on file to calculate BMI. Physical Exam  Constitutional: She is oriented to person, place, and time. No distress.  HENT:  Head: Normocephalic and atraumatic.  Mouth/Throat: Oropharynx is clear and moist. No oropharyngeal exudate.  Eyes: Conjunctivae are normal. Pupils are equal, round, and reactive to light.  Neck: Normal range of motion. Neck supple.  Cardiovascular: Normal rate, regular rhythm and normal heart sounds.   Pulmonary/Chest: Effort normal and breath sounds normal.  Abdominal: Soft. Bowel sounds are normal.  Musculoskeletal: She exhibits edema (bilateral LE) and tenderness.  Right shoulder pain.   Neurological: She is alert and oriented to person, place, and time.  Skin: Skin is warm and dry. She is not diaphoretic.  Chronic venous statis ulcers to bilateral LE, dressing wrapped and intact   Psychiatric: She has a normal mood and affect.    Labs reviewed:  Recent Labs  11/09/15 02/13/16  NA 136* 139  K 5.6* 6.3*  BUN 16 28*  CREATININE 0.4* 0.5    Recent Labs  11/09/15  AST 15  ALT 5*    Recent Labs  11/09/15  WBC 9.9  HGB 11.6*  HCT 36  PLT 314   Lab Results  Component Value Date   TSH 0.38* 11/09/2015   Lab  Results  Component Value Date   HGBA1C 5.6 02/13/2016   No results found for: CHOL, HDL, LDLCALC, LDLDIRECT, TRIG, CHOLHDL  Significant Diagnostic Results in last 30 days:  No results found.  Assessment/Plan  Hypothyroidism Taking Levothyroxine , last TSH 0.38 11/09/15, update TSH  Tremor, essential Seizure free since last visited, continue Depakote  bid.   Normocytic anemia No apparent bleed, update CBC  Hypertension Blood pressure is controlled, continue Metoprolol  and Furosmide  daily.   Chronic diastolic CHF (congestive heart failure) Clinically compensated, continue Furosemide  daily.   DM (diabetes mellitus), type 2, uncontrolled Last Hgb A1c 5.6 02/13/16  Chronic pain Pain is managed  with Baclofen 10mg  bid, Tylenol, Biofreeze  Hyperkalemia Last serum K 6.3 02/13/16, update CMP  Hypernatremia 02/13/16 Na 139    Family/ staff Communication: continue SNF for care needs.   Labs/tests ordered: CBC, CMP, TSH

## 2016-04-23 ENCOUNTER — Encounter: Payer: Self-pay | Admitting: Internal Medicine

## 2016-04-23 ENCOUNTER — Non-Acute Institutional Stay (SKILLED_NURSING_FACILITY): Payer: Medicare Other | Admitting: Internal Medicine

## 2016-04-23 DIAGNOSIS — I10 Essential (primary) hypertension: Secondary | ICD-10-CM

## 2016-04-23 DIAGNOSIS — E1100 Type 2 diabetes mellitus with hyperosmolarity without nonketotic hyperglycemic-hyperosmolar coma (NKHHC): Secondary | ICD-10-CM

## 2016-04-23 DIAGNOSIS — Z515 Encounter for palliative care: Secondary | ICD-10-CM | POA: Diagnosis not present

## 2016-04-23 DIAGNOSIS — D649 Anemia, unspecified: Secondary | ICD-10-CM | POA: Diagnosis not present

## 2016-04-23 DIAGNOSIS — E039 Hypothyroidism, unspecified: Secondary | ICD-10-CM | POA: Diagnosis not present

## 2016-04-23 NOTE — Progress Notes (Signed)
Patient ID: [name], female   DOB: 1951/02/28, 65 y.o.   MRN: [id number]  Location:  [facility] Room Number: 214B Place of Service:  SNF (31) Provider:  Kimber Relic, MD  Patient Care Team: Kimber Relic, MD as PCP - General (Internal Medicine)  Extended Emergency Contact Information Primary Emergency Contact: Pickens,Jean Address: [address]          [place] [phone] [place] Home Phone: [phone] Relation: Sister Secondary Emergency Contact: Mil[place] Home Phone: [phone] Relation: Sister  Code Status:  DNR Goals of care: Advanced Directive information Advanced Directives 04/23/2016  Does patient have an advance directive? Yes  Type of Advance Directive Out of facility DNR (pink MOST or yellow form)  Does patient want to make changes to advanced directive? -  Copy of advanced directive(s) in chart? Yes  Pre-existing out of facility DNR order (yellow form or pink MOST form) Yellow form placed in chart (order not valid for inpatient use)     Chief Complaint  Patient presents with  . Acute Visit    HPI:  Pt is a 65 y.o. female seen today for an acute visit At patient's request to clarify her wishes regarding lab work. Patient states that she is willing to have routine lab work done because of her diabetes. We had previously canceled requested lab work at her direction, but she wants to change her wishes.  Patient is feeling well otherwise.  She further requests to minimize medications and would like to see if she can get along without Lasix or Lopressor.  Past Medical History  Diagnosis Date  . Hypertension   . Thyroid disease     Hypothyroidism  . CHF (congestive heart failure) (HCC)     Status post 2-D echocardiogram 08/12/2010, EF 60-65%, grade 1 diastolic dysfunction  . Epilepsy (HCC)   . Venous insufficiency   . Morbid obesity (HCC)   . Venous stasis ulcers (HCC)   .  Essential tremor   . Dysphagia    Past Surgical History  Procedure Laterality Date  . Bilateral cataract surgery with lens implants    . Right total hip replacement    . Left hip hemiarthroplasty    . Right knee open reduction and internal fixation of patella fracture    . Abdominal hysterectomy      Allergies  Allergen Reactions  . Codeine   . Morphine And Related   . Naproxen   . Penicillins       Medication List       This list is accurate as of: 04/23/16 12:12 PM.  Always use your most recent med list.               baclofen 10 MG tablet  Commonly known as:  LIORESAL  Take 10 mg by mouth 2 (two) times daily. For tremors     BIOFREEZE 4 % Gel  Generic drug:  Menthol (Topical Analgesic)  Apply topically daily.     divalproex 500 MG DR tablet  Commonly known as:  DEPAKOTE  Take 500 mg by mouth 2 (two) times daily. For seizure disorder     furosemide 20 MG tablet  Commonly known as:  LASIX  Take 20 mg by mouth daily. Takes in the evening     levothyroxine 150 MCG tablet  Commonly known as:  SYNTHROID, LEVOTHROID  Take 150 mcg by mouth daily before breakfast. For hypothyroidism  metoprolol tartrate 25 MG tablet  Commonly known as:  LOPRESSOR  Take 25 mg by mouth daily.     primidone 50 MG tablet  Commonly known as:  MYSOLINE  Take 1 tablet (50 mg total) by mouth 2 (two) times daily.     traMADol 50 MG tablet  Commonly known as:  ULTRAM  Take by mouth. Take 50 mg every 4 hours as needed for mild pain. Take 2 tabs (100 mg) every 4 hours as needed for moderate pain     TYLENOL 167 MG/5ML Liqd  Generic drug:  Acetaminophen  Take 20 mLs by mouth. Twice daily for pain . Do not exceed 4 gm of Tylenol in 24 hours        Review of Systems  Constitutional: Negative for fever, chills, activity change and appetite change.  HENT: Negative for tinnitus.   Respiratory: Negative for cough and shortness of breath.   Cardiovascular: Negative for chest pain,  palpitations and leg swelling.  Gastrointestinal: Negative for abdominal pain, diarrhea and constipation.  Genitourinary: Negative for dysuria, urgency and frequency.  Musculoskeletal: Negative for myalgias and back pain.  Skin: Positive for wound (chronic venous statis ulcer).       LE skin changes   Neurological: Positive for tremors. Negative for dizziness and headaches.  Psychiatric/Behavioral: Negative for behavioral problems and confusion.    Immunization History  Administered Date(s) Administered  . Pneumococcal Polysaccharide-23 06/10/2013   Pertinent  Health Maintenance Due  Topic Date Due  . FOOT EXAM  08/10/1959  . OPHTHALMOLOGY EXAM  08/10/1959  . URINE MICROALBUMIN  08/10/1959  . MAMMOGRAM  08/10/1999  . COLONOSCOPY  08/10/1999  . DEXA SCAN  08/09/2014  . PNA vac Low Risk Adult (1 of 2 - PCV13) 08/09/2014  . INFLUENZA VACCINE  10/02/2017 (Originally 07/17/2016)  . HEMOGLOBIN A1C  08/12/2016   No flowsheet data found. Functional Status Survey:    Filed Vitals:   04/23/16 1216  BP: 103/55  Pulse: 56  Resp: 18  Height: 5\' 4"  (1.626 m)  Weight: 158 lb (71.668 kg)  SpO2: 96%   Body mass index is 27.11 kg/(m^2). Physical Exam  Constitutional: She is oriented to person, place, and time. No distress.  HENT:  Head: Normocephalic and atraumatic.  Mouth/Throat: Oropharynx is clear and moist. No oropharyngeal exudate.  Eyes: Conjunctivae are normal. Pupils are equal, round, and reactive to light.  Neck: Normal range of motion. Neck supple.  Cardiovascular: Normal rate, regular rhythm and normal heart sounds.   Pulmonary/Chest: Effort normal and breath sounds normal.  Abdominal: Soft. Bowel sounds are normal.  Musculoskeletal: She exhibits edema (bilateral LE) and tenderness.  Right shoulder pain.   Neurological: She is alert and oriented to person, place, and time.  Skin: Skin is warm and dry. She is not diaphoretic.  Chronic venous statis ulcers to bilateral LE,  dressing wrapped and intact   Psychiatric: She has a normal mood and affect.    Labs reviewed:  Recent Labs  11/09/15 02/13/16  NA 136* 139  K 5.6* 6.3*  BUN 16 28*  CREATININE 0.4* 0.5    Recent Labs  11/09/15  AST 15  ALT 5*    Recent Labs  11/09/15  WBC 9.9  HGB 11.6*  HCT 36  PLT 314   Lab Results  Component Value Date   TSH 0.38* 11/09/2015   Lab Results  Component Value Date   HGBA1C 5.6 02/13/2016    Assessment/Plan  1. End of life care  Orders were changed to allow lab work to be done  2. Normocytic anemia -CBC  3. Uncontrolled type 2 diabetes mellitus with hyperosmolarity without coma, without long-term current use of insulin (HCC) -Hemoglobin A1c, CMP  4. Hypothyroidism, unspecified hypothyroidism type -TSH  5. Essential hypertension -Discontinue Lopressor and furosemide

## 2017-06-20 ENCOUNTER — Other Ambulatory Visit (HOSPITAL_COMMUNITY): Payer: Self-pay | Admitting: Internal Medicine

## 2017-06-20 DIAGNOSIS — A4902 Methicillin resistant Staphylococcus aureus infection, unspecified site: Secondary | ICD-10-CM

## 2017-06-20 DIAGNOSIS — L03119 Cellulitis of unspecified part of limb: Secondary | ICD-10-CM

## 2017-06-21 ENCOUNTER — Other Ambulatory Visit (HOSPITAL_COMMUNITY): Payer: Self-pay | Admitting: Internal Medicine

## 2017-06-21 ENCOUNTER — Ambulatory Visit (HOSPITAL_COMMUNITY)
Admission: RE | Admit: 2017-06-21 | Discharge: 2017-06-21 | Disposition: A | Discharge status: Home / Self Care | Payer: Medicare Other | Source: Ambulatory Visit | Attending: Internal Medicine | Admitting: Internal Medicine

## 2017-06-21 ENCOUNTER — Encounter (HOSPITAL_COMMUNITY): Payer: Self-pay | Admitting: Diagnostic Radiology

## 2017-06-21 DIAGNOSIS — L03119 Cellulitis of unspecified part of limb: Secondary | ICD-10-CM

## 2017-06-21 DIAGNOSIS — A4902 Methicillin resistant Staphylococcus aureus infection, unspecified site: Secondary | ICD-10-CM

## 2017-06-21 HISTORY — PX: IR FLUORO GUIDE CV LINE LEFT: IMG2282

## 2017-06-21 HISTORY — PX: IR US GUIDE VASC ACCESS RIGHT: IMG2390

## 2017-06-21 MED ORDER — HEPARIN SOD (PORK) LOCK FLUSH 100 UNIT/ML IV SOLN
INTRAVENOUS | Status: AC
  Filled 2017-06-21: qty 5

## 2017-06-21 MED ORDER — LIDOCAINE HCL 1 % IJ SOLN
INTRAMUSCULAR | Status: DC | PRN
  Administered 2017-06-21: 5 mL

## 2017-06-21 MED ORDER — LIDOCAINE HCL 1 % IJ SOLN
INTRAMUSCULAR | Status: AC
  Filled 2017-06-21: qty 20

## 2017-06-21 NOTE — Procedures (Signed)
Left arm PICC placement.  Tip at SVC/RA junction.  Ready to use.  Minimal blood loss and no immediate complication.

## 2017-06-26 ENCOUNTER — Other Ambulatory Visit (HOSPITAL_COMMUNITY): Payer: Self-pay | Admitting: Nurse Practitioner

## 2017-06-26 DIAGNOSIS — L03119 Cellulitis of unspecified part of limb: Secondary | ICD-10-CM

## 2017-06-27 ENCOUNTER — Encounter (HOSPITAL_COMMUNITY): Payer: Self-pay

## 2017-06-27 ENCOUNTER — Ambulatory Visit (HOSPITAL_COMMUNITY)
Admission: RE | Admit: 2017-06-27 | Discharge: 2017-06-27 | Disposition: A | Discharge status: Home / Self Care | Payer: Medicare Other | Source: Ambulatory Visit | Attending: Nurse Practitioner | Admitting: Nurse Practitioner

## 2017-07-12 ENCOUNTER — Other Ambulatory Visit (HOSPITAL_COMMUNITY): Payer: Self-pay | Admitting: Nurse Practitioner

## 2017-07-12 ENCOUNTER — Ambulatory Visit (HOSPITAL_COMMUNITY)
Admission: RE | Admit: 2017-07-12 | Discharge: 2017-07-12 | Disposition: A | Discharge status: Home / Self Care | Payer: Medicare Other | Source: Ambulatory Visit | Attending: Nurse Practitioner | Admitting: Nurse Practitioner

## 2017-07-12 ENCOUNTER — Encounter (HOSPITAL_COMMUNITY): Payer: Self-pay | Admitting: General Surgery

## 2017-07-12 DIAGNOSIS — L03119 Cellulitis of unspecified part of limb: Secondary | ICD-10-CM | POA: Insufficient documentation

## 2017-07-12 HISTORY — PX: IR FLUORO GUIDE CV LINE LEFT: IMG2282

## 2017-07-12 HISTORY — PX: IR US GUIDE VASC ACCESS LEFT: IMG2389

## 2017-07-12 MED ORDER — LIDOCAINE HCL 1 % IJ SOLN
INTRAMUSCULAR | Status: DC | PRN
  Administered 2017-07-12: 5 mL

## 2017-07-12 MED ORDER — LIDOCAINE HCL 1 % IJ SOLN
INTRAMUSCULAR | Status: AC
  Filled 2017-07-12: qty 20

## 2017-07-12 NOTE — Procedures (Signed)
Left arm PICC placement.  Tip at SVC/RA junction.  Ready to use.  No blood loss and no immediate complication.    [name] E 2:07 PM 07/12/2017

## 2020-01-18 DEATH — deceased
